# Patient Record
Sex: Female | Born: 1975 | Race: White | Hispanic: Yes | Marital: Married | Smoking: Never smoker
Health system: Southern US, Community
[De-identification: ages and names within clinical notes are randomized; demographics above are authoritative.]

## PROBLEM LIST (undated history)

## (undated) DIAGNOSIS — Z975 Presence of (intrauterine) contraceptive device: Secondary | ICD-10-CM

## (undated) DIAGNOSIS — T8859XA Other complications of anesthesia, initial encounter: Secondary | ICD-10-CM

## (undated) DIAGNOSIS — N3010 Interstitial cystitis (chronic) without hematuria: Secondary | ICD-10-CM

## (undated) DIAGNOSIS — E785 Hyperlipidemia, unspecified: Secondary | ICD-10-CM

## (undated) DIAGNOSIS — K529 Noninfective gastroenteritis and colitis, unspecified: Secondary | ICD-10-CM

## (undated) DIAGNOSIS — K760 Fatty (change of) liver, not elsewhere classified: Secondary | ICD-10-CM

## (undated) DIAGNOSIS — R519 Headache, unspecified: Secondary | ICD-10-CM

## (undated) DIAGNOSIS — J45909 Unspecified asthma, uncomplicated: Secondary | ICD-10-CM

## (undated) DIAGNOSIS — N809 Endometriosis, unspecified: Secondary | ICD-10-CM

## (undated) DIAGNOSIS — K589 Irritable bowel syndrome without diarrhea: Secondary | ICD-10-CM

## (undated) DIAGNOSIS — T4145XA Adverse effect of unspecified anesthetic, initial encounter: Secondary | ICD-10-CM

## (undated) DIAGNOSIS — R51 Headache: Secondary | ICD-10-CM

## (undated) DIAGNOSIS — L408 Other psoriasis: Secondary | ICD-10-CM

## (undated) HISTORY — DX: Fatty (change of) liver, not elsewhere classified: K76.0

## (undated) HISTORY — DX: Other psoriasis: L40.8

## (undated) HISTORY — DX: Unspecified asthma, uncomplicated: J45.909

## (undated) HISTORY — DX: Headache: R51

## (undated) HISTORY — DX: Endometriosis, unspecified: N80.9

## (undated) HISTORY — DX: Presence of (intrauterine) contraceptive device: Z97.5

## (undated) HISTORY — DX: Headache, unspecified: R51.9

## (undated) HISTORY — DX: Irritable bowel syndrome, unspecified: K58.9

## (undated) HISTORY — DX: Noninfective gastroenteritis and colitis, unspecified: K52.9

## (undated) HISTORY — DX: Interstitial cystitis (chronic) without hematuria: N30.10

## (undated) HISTORY — PX: TONSILLECTOMY: SUR1361

## (undated) HISTORY — DX: Hyperlipidemia, unspecified: E78.5

---

## 2008-09-20 HISTORY — PX: TUBAL LIGATION: SHX77

## 2010-07-08 ENCOUNTER — Encounter
Admission: RE | Admit: 2010-07-08 | Discharge: 2010-09-03 | Payer: Self-pay | Source: Home / Self Care | Attending: Family Medicine | Admitting: Family Medicine

## 2010-10-16 ENCOUNTER — Ambulatory Visit
Admission: RE | Admit: 2010-10-16 | Discharge: 2010-10-16 | Payer: Self-pay | Source: Home / Self Care | Attending: Surgery | Admitting: Surgery

## 2010-10-16 LAB — POCT HEMOGLOBIN-HEMACUE: Hemoglobin: 14 g/dL (ref 12.0–15.0)

## 2010-10-21 HISTORY — PX: UMBILICAL HERNIA REPAIR: SHX196

## 2010-10-22 NOTE — Op Note (Signed)
  Sierra Hodge, Sierra Hodge              ACCOUNT NO.:  [id number]  MEDICAL RECORD NO.:  [id number]          PATIENT TYPE:  AMB  LOCATION:  DSC                          FACILITY:  [facility]  PHYSICIAN:  Abigail Miyamoto, M.D. DATE OF BIRTH:  Aug 25, 1976  DATE OF PROCEDURE:  10/16/2010 DATE OF DISCHARGE:                              OPERATIVE REPORT   PREOPERATIVE DIAGNOSIS:  Umbilical hernia.  POSTOPERATIVE DIAGNOSIS:  Umbilical hernia.  PROCEDURE:  Umbilical hernia repair.  SURGEON:  Abigail Miyamoto, MD  ANESTHESIA:  General and 0.5% Marcaine.  ESTIMATED BLOOD LOSS:  Minimal.  PROCEDURE IN DETAIL:  The patient was brought to the operating room identified as [name].  She was placed supine on the operating room table and general anesthesia was induced.  Her abdomen was then prepped and draped in usual sterile fashion.  I anesthetized the skin with the 0.5% Marcaine.  I then made a small transverse incision above the upper edge of the umbilicus.  This was carried down to a small hernia sac, which was easily identified and separated from the overlying umbilical skin with the electrocautery.  I carried the hernia sac down to the fascia and completely excised the sac.  The fascial defect was less than 0.5 cm in size.  I closed the small fascial defect with two figure-of-eight #1 Novafil sutures.  I then anesthetized the fascia circumferentially as well as the skin with the Marcaine.  I replaced the umbilical skin back in place with interrupted 3-0 Vicryl sutures.  I then closed the subcutaneous tissue with interrupted 3-0 Vicryl sutures and closed the skin with a running 4-0 Monocryl.  Steri-Strips, gauze and Tegaderm were then applied.  The patient tolerated the procedure well.  All counts were correct at the end of the procedure.  The patient was then extubated in the operating room and taken in a stable condition to the recovery room.     Abigail Miyamoto,  M.D.     DB/MEDQ  D:  10/16/2010  T:  10/16/2010  Job:  [id number]  Electronically Signed by Abigail Miyamoto M.D. on 10/22/2010 10:08:33 AM

## 2011-03-23 ENCOUNTER — Other Ambulatory Visit (HOSPITAL_COMMUNITY)
Admission: RE | Admit: 2011-03-23 | Discharge: 2011-03-23 | Disposition: A | Discharge status: Home / Self Care | Payer: Managed Care, Other (non HMO) | Source: Ambulatory Visit | Attending: Obstetrics and Gynecology | Admitting: Obstetrics and Gynecology

## 2011-03-23 DIAGNOSIS — Z113 Encounter for screening for infections with a predominantly sexual mode of transmission: Secondary | ICD-10-CM | POA: Insufficient documentation

## 2011-03-23 DIAGNOSIS — Z01419 Encounter for gynecological examination (general) (routine) without abnormal findings: Secondary | ICD-10-CM | POA: Insufficient documentation

## 2011-10-12 ENCOUNTER — Encounter (INDEPENDENT_AMBULATORY_CARE_PROVIDER_SITE_OTHER): Payer: Self-pay | Admitting: Surgery

## 2011-10-18 ENCOUNTER — Encounter (INDEPENDENT_AMBULATORY_CARE_PROVIDER_SITE_OTHER): Payer: Self-pay | Admitting: Surgery

## 2011-10-19 ENCOUNTER — Encounter (INDEPENDENT_AMBULATORY_CARE_PROVIDER_SITE_OTHER): Payer: Self-pay | Admitting: Surgery

## 2011-10-19 ENCOUNTER — Ambulatory Visit (INDEPENDENT_AMBULATORY_CARE_PROVIDER_SITE_OTHER): Payer: Managed Care, Other (non HMO) | Admitting: Surgery

## 2011-10-19 VITALS — BP 98/70 | HR 72 | Temp 98.0°F | Resp 20 | Ht 59.0 in | Wt 129.0 lb

## 2011-10-19 DIAGNOSIS — R1033 Periumbilical pain: Secondary | ICD-10-CM

## 2011-10-19 NOTE — Progress Notes (Signed)
Subjective:     Patient ID: Sierra Hodge, female   DOB: Mar 17, 1976, 36 y.o.   MRN: [id number]  HPI This is a female that I performed umbilical hernia repair without mesh a year ago. She most recently has been having problems with what sounds like cystitis and pain in her upper umbilicus. She was sent here by her urologist for further evaluation. She has not noticed any bulge at the umbilicus and has had no obstructive symptoms.  Review of Systems     Objective:   Physical Exam On exam, her abdomen is soft and nontender. There is no evidence of hernia at the umbilicus.  CAT scan of the abdomen and pelvis which also shows no evidence of recurrent umbilical hernia    Assessment:     Patient with abdominal pain of uncertain etiology with no evidence of recurrent hernia    Plan:     i reassured her regarding hernia. I will see her back here as needed

## 2011-10-21 ENCOUNTER — Encounter (INDEPENDENT_AMBULATORY_CARE_PROVIDER_SITE_OTHER): Payer: Self-pay

## 2011-12-13 ENCOUNTER — Encounter (INDEPENDENT_AMBULATORY_CARE_PROVIDER_SITE_OTHER): Payer: Self-pay

## 2012-01-20 ENCOUNTER — Encounter: Payer: Self-pay | Admitting: Obstetrics and Gynecology

## 2012-01-20 ENCOUNTER — Ambulatory Visit (INDEPENDENT_AMBULATORY_CARE_PROVIDER_SITE_OTHER): Payer: Private Health Insurance - Indemnity | Admitting: Obstetrics and Gynecology

## 2012-01-20 VITALS — BP 98/60 | HR 68 | Temp 98.8°F | Resp 18 | Ht 58.25 in | Wt 125.0 lb

## 2012-01-20 DIAGNOSIS — N898 Other specified noninflammatory disorders of vagina: Secondary | ICD-10-CM

## 2012-01-20 DIAGNOSIS — R350 Frequency of micturition: Secondary | ICD-10-CM

## 2012-01-20 DIAGNOSIS — M549 Dorsalgia, unspecified: Secondary | ICD-10-CM

## 2012-01-20 DIAGNOSIS — IMO0001 Reserved for inherently not codable concepts without codable children: Secondary | ICD-10-CM | POA: Insufficient documentation

## 2012-01-20 LAB — POCT URINALYSIS DIPSTICK
Blood, UA: 50
Glucose, UA: NEGATIVE
Nitrite, UA: NEGATIVE
Protein, UA: NEGATIVE
Spec Grav, UA: 1.01
Urobilinogen, UA: NEGATIVE

## 2012-01-20 NOTE — Patient Instructions (Signed)
Pt has been given ACOG for endometriosis and laparoscopy

## 2012-01-20 NOTE — Progress Notes (Signed)
Vag. Discharge:yes Odor:yes Fever:yes Irreg.Periods:no Dyspareunia:no Dysuria:no Frequency:yes Urgency:no Hematuria:yes Kidney stones:no Constipation:no Diarrhea:no Rectal Bleeding: no Vomiting:no Nausea:no Pregnant:no Fibroids:no Endometriosis:no Hx of Ovarian Cyst:no Hx IUD:no Hx STD-PID:yes, PID after having 2nd child Appendectomy:no Gall Bladder Dz:no Pt c/o abdominal, pelvic, vaginal and lower back pain. Pt states the pain is worse right before her period starts. Pt is also experiencing pain after urinating and frequency. Sierra Hodge  Subjective:     Sierra Hodge is a 36 y.o. female G2P2. who presents with complaints of abdominal/pelvic pain.   Onset of symptoms was gradual starting 5 months ago and is gradually worsening.The pain is located in the lower abdomen, back and in the low pelvis and is described as cramping, pressure-like and stabbing with an intensity of 7 on a 10 point pain scale. The pain does radiate to left back and right back. The pain occurs prior to onset of menses Symptoms don't improve with any interventionsand worsen with urination.  In the past, she has  undergone treatment with  Antibiotics for urinary symptoms.  She has had a complete urologic workup with Dr. McDiarmid including a CT scan, all of which is negative by patient's history.   Associated symptoms: frequency and hematuria Family history: non contributory .  Pertinent gyn history:   Menses: regular every month without intermenstrual spotting, usually lasting less than 6 days and with severe dysmenorrhea  PMH: Past Medical History  Diagnosis Date  . Chronic interstitial cystitis   . Hyperlipidemia   . Other psoriasis   . History of umbilical hernia repair      Medication: (Not in a hospital admission) Allergies: Allergies  Allergen Reactions  . Morphine And Related Itching      Review of systems:   Genitourinary:positive for dysuria, frequency, hematuria and  urgency  Objective:    BP 98/60  Pulse 68  Temp 98.8 F (37.1 C)  Resp 18  Ht 4' 10.25" (1.48 m)  Wt 125 lb (56.7 kg)  BMI 25.90 kg/m2  LMP 01/09/2012  Weight: Wt Readings from Last 1 Encounters:  01/20/12 125 lb (56.7 kg)    BMI: Body mass index is 25.90 kg/(m^2).  General Appearance: Alert, appropriate appearance for age. No acute distress Back: No CVA tendernessr Gastrointestinal: Soft, non-tender, no masses or organomegaly Pelvic Exam: Vulva and vagina appear normal. Bimanual exam reveals normal uterus and adnexa. Rectovaginal: no rectovaginal masses     OSOM BV: neg  Assessment and Plan:   Abdominopelvic pain. History consistent with interstistial cystitis vs endometriosis  Plan:  All questions answered. Diagnosis explained in detail, including differential. Educational material distributed. Schedule for surgery - Diagnostic laparoscopy.  The indications, risks, and benefits reviewed. The patient accepts.. Follow-up: TBD  Sierra Hodge P  MD 5/4/20131:12 AM

## 2012-01-22 ENCOUNTER — Encounter: Payer: Self-pay | Admitting: Obstetrics and Gynecology

## 2012-01-28 LAB — POCT OSOM BVBLUE TEST: Bacterial Vaginosis: NEGATIVE

## 2012-01-31 ENCOUNTER — Other Ambulatory Visit: Payer: Self-pay | Admitting: Urology

## 2012-03-01 ENCOUNTER — Telehealth: Payer: Self-pay | Admitting: Obstetrics and Gynecology

## 2012-03-02 ENCOUNTER — Other Ambulatory Visit: Payer: Self-pay | Admitting: Obstetrics and Gynecology

## 2012-03-15 ENCOUNTER — Encounter (HOSPITAL_COMMUNITY): Payer: Self-pay | Admitting: Pharmacy Technician

## 2012-03-20 ENCOUNTER — Encounter: Payer: Self-pay | Admitting: Obstetrics and Gynecology

## 2012-03-20 ENCOUNTER — Other Ambulatory Visit: Payer: Self-pay | Admitting: Obstetrics and Gynecology

## 2012-03-20 ENCOUNTER — Ambulatory Visit (INDEPENDENT_AMBULATORY_CARE_PROVIDER_SITE_OTHER): Payer: Private Health Insurance - Indemnity | Admitting: Obstetrics and Gynecology

## 2012-03-20 ENCOUNTER — Ambulatory Visit (INDEPENDENT_AMBULATORY_CARE_PROVIDER_SITE_OTHER): Payer: Private Health Insurance - Indemnity

## 2012-03-20 ENCOUNTER — Encounter (HOSPITAL_COMMUNITY): Payer: Self-pay

## 2012-03-20 ENCOUNTER — Other Ambulatory Visit: Payer: Self-pay

## 2012-03-20 ENCOUNTER — Encounter (HOSPITAL_COMMUNITY)
Admission: RE | Admit: 2012-03-20 | Discharge: 2012-03-20 | Disposition: A | Discharge status: Home / Self Care | Payer: Managed Care, Other (non HMO) | Source: Ambulatory Visit | Attending: Obstetrics and Gynecology | Admitting: Obstetrics and Gynecology

## 2012-03-20 VITALS — BP 102/60 | HR 72 | Temp 98.1°F | Resp 18 | Ht 59.0 in | Wt 125.0 lb

## 2012-03-20 DIAGNOSIS — Z01818 Encounter for other preprocedural examination: Secondary | ICD-10-CM

## 2012-03-20 DIAGNOSIS — R102 Pelvic and perineal pain: Secondary | ICD-10-CM

## 2012-03-20 DIAGNOSIS — N949 Unspecified condition associated with female genital organs and menstrual cycle: Secondary | ICD-10-CM

## 2012-03-20 DIAGNOSIS — R109 Unspecified abdominal pain: Secondary | ICD-10-CM

## 2012-03-20 DIAGNOSIS — N736 Female pelvic peritoneal adhesions (postinfective): Secondary | ICD-10-CM

## 2012-03-20 LAB — CBC
HCT: 36.8 % (ref 36.0–46.0)
Hemoglobin: 12.3 g/dL (ref 12.0–15.0)
RBC: 4.26 MIL/uL (ref 3.87–5.11)

## 2012-03-20 LAB — POCT URINALYSIS DIPSTICK
Bilirubin, UA: NEGATIVE
Blood, UA: NEGATIVE
Ketones, UA: NEGATIVE
Leukocytes, UA: NEGATIVE
Protein, UA: NEGATIVE
pH, UA: 8

## 2012-03-20 LAB — SURGICAL PCR SCREEN
MRSA, PCR: NEGATIVE
Staphylococcus aureus: NEGATIVE

## 2012-03-20 NOTE — Patient Instructions (Addendum)
20 Raymonda Mormile  03/20/2012   Your procedure is scheduled on:  03/29/12  Enter through the Main Entrance of [facility] at 7 AM.  Pick up the phone at the desk and dial [phone].   Call this number if you have problems the morning of surgery: [phone]   Remember:   Do not eat food:After Midnight.  Do not drink clear liquids: After Midnight.  Take these medicines the morning of surgery with A SIP OF WATER: NA   Do not wear jewelry, make-up or nail polish.  Do not wear lotions, powders, or perfumes. You may wear deodorant.  Do not shave 48 hours prior to surgery.  Do not bring valuables to the hospital.  Contacts, dentures or bridgework may not be worn into surgery.  Leave suitcase in the car. After surgery it may be brought to your room.  For patients admitted to the hospital, checkout time is 11:00 AM the day of discharge.   Patients discharged the day of surgery will not be allowed to drive home.  Name and phone number of your driver: NA  Special Instructions: CHG Shower Use Special Wash: 1/2 bottle night before surgery and 1/2 bottle morning of surgery.   Please read over the following fact sheets that you were given: MRSA Information

## 2012-03-20 NOTE — Progress Notes (Signed)
Sierra Hodge is an 36 y.o. female. P2 002 who will undergo diagnostic and possible operative laparoscopy for evaluation of pelvic pain. . Onset of symptoms was gradual starting 5 months ago and is gradually worsening.The pain is located in the lower abdomen, back and in the low pelvis and is described  as cramping, pressure-like and stabbing with an intensity of 7 on a 10 point pain scale. The pain does radiate to left back and right back. The pain occurs prior to onset of menses Symptoms don't improve with any interventionsand worsen with urination.  In the past, she has undergone treatment with Antibiotics for urinary symptoms. She has had a complete urologic workup with Dr. McDiarmid including a CT scan, all of which is negative by patient's history. She has recently undergone a cystoscopy with Dr. Jacquelyne Balint and he plans a hydrodistention at the time of her upcoming laparoscopy.   Pertinent Gynecological History: Menses: flow is moderate and with severe dysmenorrhea Bleeding: no abnormal uterine bleed Contraception: tubal ligation DES exposure: unknown Blood transfusions: none Sexually transmitted diseases: no past history and patient states that she had PID after birth of her second child however the description of treatment seems to be most consistent with atrophic vaginitis Previous GYN Procedures: cesarean sections for delivery and tubal ligation  Last mammogram: Not applicable Date: not applicable  Last pap: normal Date: 2011 OB History: G2, P2  Menstrual History: Menarche age: 33 Patient's last menstrual period was 03/06/2012.    Past Medical History  Diagnosis Date  . Chronic interstitial cystitis   . Hyperlipidemia   . Other psoriasis   . History of umbilical hernia repair     Past Surgical History  Procedure Date  . Cesarean section     2008, 2010  . Hernia repair     10/2010  . Tubal ligation     2010  . Tonsillectomy   . Tonsillectomy     Family History    Problem Relation Age of Onset  . Heart disease Paternal Aunt   . Heart disease Paternal Uncle     Social History:  reports that she has never smoked. She has never used smokeless tobacco. She reports that she drinks about .6 ounces of alcohol per week. She reports that she does not use illicit drugs.  Allergies:  Allergies  Allergen Reactions  . Morphine And Related Itching     Review of Systems  Constitutional: Negative.   HENT: Negative.   Eyes: Negative.   Cardiovascular: Negative.   Gastrointestinal: Positive for abdominal pain. Negative for diarrhea, constipation, blood in stool and melena.  Genitourinary: Positive for dysuria, urgency and frequency. Negative for hematuria and flank pain.  Musculoskeletal: Positive for joint pain.       Has seen Dr. Nickola Major with a positive ANA test   Skin: Negative.   Neurological: Negative.   Endo/Heme/Allergies: Negative.   Psychiatric/Behavioral: Negative for depression.       Feels  down because of inability to find the actual cause of and to manage pelvic pain    Blood pressure 102/60, pulse 72, temperature 98.1 F (36.7 C), temperature source Oral, resp. rate 18, height 4\' 11"  (1.499 m), weight 125 lb (56.7 kg), last menstrual period 03/06/2012. Physical Exam  Constitutional: She is oriented to person, place, and time. She appears well-developed and well-nourished.  HENT:  Head: Normocephalic and atraumatic.  Eyes: Conjunctivae and EOM are normal.  Neck: Normal range of motion. Neck supple.  Cardiovascular: Normal rate and regular rhythm.  Respiratory: Effort normal and breath sounds normal.  GI: Soft. Bowel sounds are normal. She exhibits no distension and no mass. There is no rebound and no guarding.  Genitourinary:       Pelvic:  VULVA: normal appearing vulva with no masses, tenderness or lesions,  VAGINA: normal appearing vagina with normal color and discharge, no lesions,there is some tenderness to palpation of the  bladder  CERVIX: normal appearing cervix without discharge or lesions, cervical motion tenderness present,  UTERUS: uterus is normal size, shape, consistency and minimal tenderness,  ADNEXA: tenderness both sides, equally mildly tender, RECTAL: normal rectal, no masses.  Musculoskeletal: Normal range of motion.  Neurological: She is alert and oriented to person, place, and time. She has normal reflexes.  Skin: Skin is warm and dry.  Psychiatric:       Mood appears slightly depressed. Patient denies suicidal or homicidal ideation.    Impression: Chronic pelvic pain Chronic urinary symptoms with dysuria urgency and overactive bladder unresponsive to VESIcare or Toviaz  Plan: The patient will undergo hydrodistention with Dr. McDiarmid She will then undergo diagnostic and operative laparoscopy. The risks of anesthesia bleeding infection and damage to adjacent organs have all been reviewed. The risk that no cause for her pelvic pain will be found has also been reviewed. And the patient wishes to proceed at [facility] on 7/10/ 2013.   Jahsir Rama P 03/22/2012, 4:27 PM

## 2012-03-22 NOTE — H&P (Signed)
Sierra Hodge is an 36 y.o. Sierra Hodge [place] . female. P2 002 who will undergo diagnostic and possible operative laparoscopy for evaluation of pelvic pain. . Onset of symptoms was gradual starting 5 months ago and is gradually worsening.The pain is located in the lower abdomen, back and in the low pelvis and is described  as cramping, pressure-like and stabbing with an intensity of 7 on a 10 point pain scale. The pain does radiate to left back and right back. The pain occurs prior to onset of menses Symptoms don't improve with any interventionsand worsen with urination.  In the past, she has undergone treatment with Antibiotics for urinary symptoms. She has had a complete urologic workup with Dr. McDiarmid including a CT scan, all of which is negative by patient's history. She has recently undergone a cystoscopy with Dr. Jacquelyne Balint and he plans a hydrodistention at the time of her upcoming laparoscopy. She also had a hernia repair in hopes that the pain would improve.  It has not.   Pertinent Gynecological History: Menses: flow is moderate and with severe dysmenorrhea Bleeding: no abnormal uterine bleed Contraception: tubal ligation DES exposure: unknown Blood transfusions: none Sexually transmitted diseases: no past history and patient states that she had PID after birth of her second child however the description of treatment seems to be most consistent with atrophic vaginitis Previous GYN Procedures: cesarean sections for delivery and tubal ligation  Last mammogram: Not applicable Date: not applicable  Last pap: normal Date: 2011 OB History: G2, P2  Menstrual History: Menarche age: 46 Patient's last menstrual period was 03/06/2012.    Past Medical History  Diagnosis Date  . Chronic interstitial cystitis   . Hyperlipidemia   . Other psoriasis   . History of umbilical hernia repair     Past Surgical History  Procedure Date  . Cesarean section     2008, 2010  . Hernia repair    10/2010  . Tubal ligation     2010  . Tonsillectomy   . Tonsillectomy     Family History  Problem Relation Age of Onset  . Heart disease Paternal Aunt   . Heart disease Paternal Uncle     Social History:  reports that she has never smoked. She has never used smokeless tobacco. She reports that she drinks about .6 ounces of alcohol per week. She reports that she does not use illicit drugs.  Allergies:  Allergies  Allergen Reactions  . Morphine And Related Itching     Review of Systems  Constitutional: Negative.   HENT: Negative.   Eyes: Negative.   Cardiovascular: Negative.   Gastrointestinal: Positive for abdominal pain. Negative for diarrhea, constipation, blood in stool and melena.  Genitourinary: Positive for dysuria, urgency and frequency. Negative for hematuria and flank pain.  Musculoskeletal: Positive for joint pain.       Has seen Dr. Nickola Major with a positive ANA test.  She was diagnosed with psoriasis. But uses no medication for this.   Skin: Negative.   Neurological: Negative.   Endo/Heme/Allergies: Negative.   Psychiatric/Behavioral: Negative for depression.       Feels  down because of inability to find the actual cause of and to manage pelvic pain    Blood pressure 102/60, pulse 72, temperature 98.1 F (36.7 C), temperature source Oral, resp. rate 18, height 4\' 11"  (1.499 m), weight 125 lb (56.7 kg), last menstrual period 03/06/2012. Physical Exam  Constitutional: She is oriented to person, place, and time. She appears well-developed and well-nourished.  HENT:  Head: Normocephalic and atraumatic.  Eyes: Conjunctivae and EOM are normal.  Neck: Normal range of motion. Neck supple.  Cardiovascular: Normal rate and regular rhythm.   Respiratory: Effort normal and breath sounds normal.  GI: Soft. Bowel sounds are normal. She exhibits no distension and no mass. There is no rebound and no guarding.  Genitourinary:       Pelvic:  VULVA: normal appearing vulva  with no masses, tenderness or lesions,  VAGINA: normal appearing vagina with normal color and discharge, no lesions,there is some tenderness to palpation of the bladder  CERVIX: normal appearing cervix without discharge or lesions, cervical motion tenderness present,  UTERUS: uterus is normal size, shape, consistency and minimal tenderness,  ADNEXA: tenderness both sides, equally mildly tender, RECTAL: normal rectal, no masses.  Musculoskeletal: Normal range of motion.  Neurological: She is alert and oriented to person, place, and time. She has normal reflexes.  Skin: Skin is warm and dry.  Psychiatric:       Mood appears slightly depressed. Patient denies suicidal or homicidal ideation.   Marland Kitchenl   Impression: Chronic pelvic pain Chronic urinary symptoms with dysuria urgency and overactive bladder unresponsive to VESIcare or Toviaz  Plan: The patient will undergo hydrodistention with Dr. McDiarmid She will then undergo diagnostic and operative laparoscopy. The risks of anesthesia bleeding infection and damage to adjacent organs have all been reviewed. The risk that no cause for her pelvic pain will be found has also been reviewed. And the patient wishes to proceed at [facility] on 7/10/ 2013.

## 2012-03-28 MED ORDER — CIPROFLOXACIN IN D5W 400 MG/200ML IV SOLN
400.0000 mg | INTRAVENOUS | Status: AC
  Administered 2012-03-29: 400 mg via INTRAVENOUS
  Filled 2012-03-28: qty 200

## 2012-03-29 ENCOUNTER — Encounter (HOSPITAL_COMMUNITY): Payer: Self-pay | Admitting: *Deleted

## 2012-03-29 ENCOUNTER — Encounter (HOSPITAL_COMMUNITY): Payer: Self-pay | Admitting: Anesthesiology

## 2012-03-29 ENCOUNTER — Ambulatory Visit (HOSPITAL_COMMUNITY): Payer: Managed Care, Other (non HMO) | Admitting: Anesthesiology

## 2012-03-29 ENCOUNTER — Encounter (HOSPITAL_COMMUNITY)
Admission: RE | Disposition: A | Discharge status: Home / Self Care | Payer: Self-pay | Source: Ambulatory Visit | Attending: Obstetrics and Gynecology

## 2012-03-29 ENCOUNTER — Ambulatory Visit (HOSPITAL_COMMUNITY)
Admission: RE | Admit: 2012-03-29 | Discharge: 2012-03-29 | Disposition: A | Discharge status: Home / Self Care | Payer: Managed Care, Other (non HMO) | Source: Ambulatory Visit | Attending: Obstetrics and Gynecology | Admitting: Obstetrics and Gynecology

## 2012-03-29 DIAGNOSIS — N949 Unspecified condition associated with female genital organs and menstrual cycle: Secondary | ICD-10-CM | POA: Insufficient documentation

## 2012-03-29 DIAGNOSIS — Z01818 Encounter for other preprocedural examination: Secondary | ICD-10-CM | POA: Insufficient documentation

## 2012-03-29 DIAGNOSIS — N318 Other neuromuscular dysfunction of bladder: Secondary | ICD-10-CM | POA: Insufficient documentation

## 2012-03-29 DIAGNOSIS — R109 Unspecified abdominal pain: Secondary | ICD-10-CM

## 2012-03-29 DIAGNOSIS — N736 Female pelvic peritoneal adhesions (postinfective): Secondary | ICD-10-CM

## 2012-03-29 DIAGNOSIS — Z01812 Encounter for preprocedural laboratory examination: Secondary | ICD-10-CM | POA: Insufficient documentation

## 2012-03-29 DIAGNOSIS — R3915 Urgency of urination: Secondary | ICD-10-CM | POA: Insufficient documentation

## 2012-03-29 DIAGNOSIS — N3010 Interstitial cystitis (chronic) without hematuria: Secondary | ICD-10-CM | POA: Insufficient documentation

## 2012-03-29 HISTORY — PX: CYSTO WITH HYDRODISTENSION: SHX5453

## 2012-03-29 HISTORY — PX: LAPAROSCOPY: SHX197

## 2012-03-29 LAB — PREGNANCY, URINE: Preg Test, Ur: NEGATIVE

## 2012-03-29 SURGERY — LAPAROSCOPY OPERATIVE
Anesthesia: General | Wound class: Clean Contaminated

## 2012-03-29 MED ORDER — ROCURONIUM BROMIDE 50 MG/5ML IV SOLN
INTRAVENOUS | Status: AC
  Filled 2012-03-29: qty 1

## 2012-03-29 MED ORDER — DEXAMETHASONE SODIUM PHOSPHATE 10 MG/ML IJ SOLN
INTRAMUSCULAR | Status: DC | PRN
  Administered 2012-03-29: 10 mg via INTRAVENOUS

## 2012-03-29 MED ORDER — MEPERIDINE HCL 25 MG/ML IJ SOLN: 6.2500 mg | INTRAMUSCULAR | Status: DC | PRN

## 2012-03-29 MED ORDER — KETOROLAC TROMETHAMINE 30 MG/ML IJ SOLN
INTRAMUSCULAR | Status: DC | PRN
  Administered 2012-03-29: 30 mg via INTRAVENOUS

## 2012-03-29 MED ORDER — BUPIVACAINE HCL (PF) 0.5 % IJ SOLN
INTRAMUSCULAR | Status: AC
  Filled 2012-03-29: qty 30

## 2012-03-29 MED ORDER — NEOSTIGMINE METHYLSULFATE 1 MG/ML IJ SOLN
INTRAMUSCULAR | Status: DC | PRN
  Administered 2012-03-29: 4 mg via INTRAVENOUS

## 2012-03-29 MED ORDER — PROPOFOL 10 MG/ML IV EMUL
INTRAVENOUS | Status: DC | PRN
  Administered 2012-03-29: 160 mg via INTRAVENOUS

## 2012-03-29 MED ORDER — ONDANSETRON HCL 4 MG/2ML IJ SOLN
INTRAMUSCULAR | Status: DC | PRN
  Administered 2012-03-29: 4 mg via INTRAVENOUS

## 2012-03-29 MED ORDER — PROPOFOL 10 MG/ML IV EMUL
INTRAVENOUS | Status: AC
  Filled 2012-03-29: qty 20

## 2012-03-29 MED ORDER — ACETAMINOPHEN 325 MG PO TABS: 325.0000 mg | ORAL_TABLET | ORAL | Status: DC | PRN

## 2012-03-29 MED ORDER — MIDAZOLAM HCL 2 MG/2ML IJ SOLN
INTRAMUSCULAR | Status: AC
  Filled 2012-03-29: qty 2

## 2012-03-29 MED ORDER — LIDOCAINE HCL (CARDIAC) 20 MG/ML IV SOLN
INTRAVENOUS | Status: AC
  Filled 2012-03-29: qty 5

## 2012-03-29 MED ORDER — FENTANYL CITRATE 0.05 MG/ML IJ SOLN
INTRAMUSCULAR | Status: DC | PRN
  Administered 2012-03-29 (×2): 50 ug via INTRAVENOUS
  Administered 2012-03-29: 100 ug via INTRAVENOUS
  Administered 2012-03-29: 50 ug via INTRAVENOUS

## 2012-03-29 MED ORDER — ONDANSETRON HCL 4 MG/2ML IJ SOLN
INTRAMUSCULAR | Status: AC
  Filled 2012-03-29: qty 2

## 2012-03-29 MED ORDER — FENTANYL CITRATE 0.05 MG/ML IJ SOLN: 25.0000 ug | INTRAMUSCULAR | Status: DC | PRN

## 2012-03-29 MED ORDER — BUPIVACAINE HCL (PF) 0.25 % IJ SOLN
INTRAMUSCULAR | Status: AC
  Filled 2012-03-29: qty 30

## 2012-03-29 MED ORDER — SILVER NITRATE-POT NITRATE 75-25 % EX MISC
CUTANEOUS | Status: AC
  Filled 2012-03-29: qty 3

## 2012-03-29 MED ORDER — PHENAZOPYRIDINE HCL 200 MG PO TABS
ORAL | Status: DC | PRN
  Administered 2012-03-29: 11:00:00 via INTRAVESICAL

## 2012-03-29 MED ORDER — GLYCOPYRROLATE 0.2 MG/ML IJ SOLN
INTRAMUSCULAR | Status: AC
  Filled 2012-03-29: qty 2

## 2012-03-29 MED ORDER — DEXAMETHASONE SODIUM PHOSPHATE 10 MG/ML IJ SOLN
INTRAMUSCULAR | Status: AC
  Filled 2012-03-29: qty 1

## 2012-03-29 MED ORDER — PROMETHAZINE HCL 12.5 MG PO TABS: 12.5000 mg | ORAL_TABLET | Freq: Four times a day (QID) | ORAL | Status: DC | PRN

## 2012-03-29 MED ORDER — KETOROLAC TROMETHAMINE 60 MG/2ML IM SOLN
INTRAMUSCULAR | Status: AC
  Filled 2012-03-29: qty 2

## 2012-03-29 MED ORDER — FENTANYL CITRATE 0.05 MG/ML IJ SOLN
INTRAMUSCULAR | Status: AC
  Filled 2012-03-29: qty 5

## 2012-03-29 MED ORDER — HYDROCODONE-ACETAMINOPHEN 5-500 MG PO TABS: ORAL_TABLET | ORAL | Status: DC

## 2012-03-29 MED ORDER — KETOROLAC TROMETHAMINE 30 MG/ML IJ SOLN: 15.0000 mg | Freq: Once | INTRAMUSCULAR | Status: DC | PRN

## 2012-03-29 MED ORDER — MIDAZOLAM HCL 5 MG/5ML IJ SOLN
INTRAMUSCULAR | Status: DC | PRN
  Administered 2012-03-29: 1 mg via INTRAVENOUS

## 2012-03-29 MED ORDER — IBUPROFEN 600 MG PO TABS: ORAL_TABLET | ORAL | Status: DC

## 2012-03-29 MED ORDER — ROCURONIUM BROMIDE 100 MG/10ML IV SOLN
INTRAVENOUS | Status: DC | PRN
  Administered 2012-03-29: 40 mg via INTRAVENOUS

## 2012-03-29 MED ORDER — GLYCOPYRROLATE 0.2 MG/ML IJ SOLN
INTRAMUSCULAR | Status: DC | PRN
  Administered 2012-03-29: 0.6 mg via INTRAVENOUS

## 2012-03-29 MED ORDER — PHENAZOPYRIDINE HCL 200 MG PO TABS
Freq: Once | ORAL | Status: DC
  Filled 2012-03-29: qty 15

## 2012-03-29 MED ORDER — PROMETHAZINE HCL 25 MG/ML IJ SOLN
INTRAMUSCULAR | Status: AC
  Administered 2012-03-29: 6.25 mg via INTRAVENOUS
  Filled 2012-03-29: qty 1

## 2012-03-29 MED ORDER — KETOROLAC TROMETHAMINE 60 MG/2ML IM SOLN
INTRAMUSCULAR | Status: DC | PRN
  Administered 2012-03-29: 30 mg via INTRAMUSCULAR

## 2012-03-29 MED ORDER — LIDOCAINE HCL (CARDIAC) 20 MG/ML IV SOLN
INTRAVENOUS | Status: DC | PRN
  Administered 2012-03-29: 40 mg via INTRAVENOUS

## 2012-03-29 MED ORDER — NEOSTIGMINE METHYLSULFATE 1 MG/ML IJ SOLN
INTRAMUSCULAR | Status: AC
  Filled 2012-03-29: qty 10

## 2012-03-29 MED ORDER — LACTATED RINGERS IV SOLN
INTRAVENOUS | Status: DC
  Administered 2012-03-29 (×4): via INTRAVENOUS

## 2012-03-29 MED ORDER — PROMETHAZINE HCL 25 MG/ML IJ SOLN
6.2500 mg | INTRAMUSCULAR | Status: DC | PRN
  Administered 2012-03-29: 6.25 mg via INTRAVENOUS

## 2012-03-29 MED ORDER — BUPIVACAINE HCL (PF) 0.25 % IJ SOLN
INTRAMUSCULAR | Status: DC | PRN
  Administered 2012-03-29: 30 mL

## 2012-03-29 MED ORDER — MIDAZOLAM HCL 2 MG/2ML IJ SOLN: 0.5000 mg | Freq: Once | INTRAMUSCULAR | Status: DC | PRN

## 2012-03-29 SURGICAL SUPPLY — 40 items
CANISTER SUCTION 2500CC (MISCELLANEOUS) ×2 IMPLANT
CATH ROBINSON RED A/P 16FR (CATHETERS) ×2 IMPLANT
CHLORAPREP W/TINT 26ML (MISCELLANEOUS) ×2 IMPLANT
CLOTH BEACON ORANGE TIMEOUT ST (SAFETY) ×2 IMPLANT
CONTAINER PREFILL 10% NBF 60ML (FORM) IMPLANT
CORD ACTIVE DISPOSABLE (ELECTRODE) ×1
CORD ELECTRO ACTIVE DISP (ELECTRODE) ×1 IMPLANT
COVER TABLE BACK 60X90 (DRAPES) ×2 IMPLANT
DERMABOND ADVANCED (GAUZE/BANDAGES/DRESSINGS)
DERMABOND ADVANCED .7 DNX12 (GAUZE/BANDAGES/DRESSINGS) IMPLANT
DRAPE HYSTEROSCOPY (DRAPE) IMPLANT
DRESSING TELFA 8X3 (GAUZE/BANDAGES/DRESSINGS) IMPLANT
GLOVE BIO SURGEON STRL SZ7.5 (GLOVE) ×2 IMPLANT
GLOVE SURG SS PI 6.5 STRL IVOR (GLOVE) ×4 IMPLANT
GOWN PREVENTION PLUS LG XLONG (DISPOSABLE) ×14 IMPLANT
NDL SAFETY ECLIPSE 18X1.5 (NEEDLE) ×1 IMPLANT
NEEDLE HYPO 18GX1.5 SHARP (NEEDLE) ×1
NS IRRIG 1000ML POUR BTL (IV SOLUTION) ×4 IMPLANT
PACK LAPAROSCOPY BASIN (CUSTOM PROCEDURE TRAY) IMPLANT
PACK LAVH (CUSTOM PROCEDURE TRAY) ×2 IMPLANT
PACK VAGINAL WOMENS (CUSTOM PROCEDURE TRAY) IMPLANT
PLUG CATH AND CAP STER (CATHETERS) IMPLANT
POUCH SPECIMEN RETRIEVAL 10MM (ENDOMECHANICALS) IMPLANT
PROTECTOR NERVE ULNAR (MISCELLANEOUS) ×2 IMPLANT
SCALPEL HARMONIC ACE (MISCELLANEOUS) IMPLANT
SET CYSTO W/LG BORE CLAMP LF (SET/KITS/TRAYS/PACK) ×2 IMPLANT
SET IRRIG TUBING LAPAROSCOPIC (IRRIGATION / IRRIGATOR) IMPLANT
STRIP CLOSURE SKIN 1/4X3 (GAUZE/BANDAGES/DRESSINGS) ×2 IMPLANT
SUT VIC AB 3-0 PS2 18 (SUTURE)
SUT VIC AB 3-0 PS2 18XBRD (SUTURE) IMPLANT
SUT VICRYL 0 ENDOLOOP (SUTURE) IMPLANT
SUT VICRYL 0 UR6 27IN ABS (SUTURE) IMPLANT
SYR 20CC LL (SYRINGE) ×2 IMPLANT
TOWEL OR 17X24 6PK STRL BLUE (TOWEL DISPOSABLE) ×8 IMPLANT
TRAY FOLEY CATH 14FR (SET/KITS/TRAYS/PACK) ×2 IMPLANT
TROCAR BALL TOP DISP 5MM (ENDOMECHANICALS) ×2 IMPLANT
TROCAR Z-THREAD BLADED 11X100M (TROCAR) ×2 IMPLANT
TROCAR Z-THREAD BLADED 5X100MM (TROCAR) IMPLANT
WARMER LAPAROSCOPE (MISCELLANEOUS) ×2 IMPLANT
WATER STERILE IRR 1000ML POUR (IV SOLUTION) ×2 IMPLANT

## 2012-03-29 NOTE — H&P (Signed)
History of Present Illness   Sierra Hodge still has her left flank pain. She has some suprapubic discomfort. She was diagnosed in [place] I believe of having interstitial cystitis. It sounds like she has never been on Elmiron or other formal therapies.   She has failed multiple antimuscarinics with side effects, including Toviaz, VESIcare, Enablex and Detrol.  She may have endometriosis. She is considering a laparoscopy.   I talked to her about interstitial cystitis. Her frequency every 30 minutes may be due to this. She also leaks without awareness. They are moderate in severity and persisting.   Review of systems: No change in bowel or neurologic status.    Past Medical History Problems  1. History of  Chronic Interstitial Cystitis 595.1 2. History of  Hypercholesterolemia 272.0 3. History of  Psoriasis 696.1  Surgical History Problems  1. History of  Cesarean Section 2. History of  Cesarean Section 3. History of  Hernia Repair 4. History of  Tonsillectomy 5. History of  Tonsillectomy 6. History of  Tubal Ligation V25.2 7. History of  Umbilical Hernia Repair  Current Meds 1. Clobetasol Propionate 0.05 % External Cream; Therapy: (Recorded:22Oct2012) to 2. Clobetasol Propionate 0.05 % External Ointment; Therapy: 16Nov2012 to 3. Detrol LA 4 MG Oral Capsule Extended Release 24 Hour; TAKE 1 CAPSULE Daily; Therapy:  28Dec2012 to (Last Rx:28Dec2012)  Requested for: 28Dec2012 4. Fluticasone Propionate 50 MCG/ACT Nasal Suspension; Therapy: 12Sep2012 to 5. Toviaz 8 MG Oral Tablet Extended Release 24 Hour; TAKE 1 TABLET DAILY; Therapy: 03Jan2013  to (Evaluate:29Dec2013); Last Rx:03Jan2013 6. VESIcare 5 MG Oral Tablet; a tablet daily for 30 days; Therapy: 03Jan2013 to (Last Rx:03Jan2013)  Family History Problems  1. Paternal history of  Alzheimer's Disease 2. Paternal history of  Death In The Family Father father passes @ age 81alzheimers 3. Family history of  Family Health Status  Number Of Children 2 daughters 4. Maternal history of  Hypercholesterolemia 5. Maternal history of  Nephrolithiasis  Social History Problems  1. Alcohol Use 1 glass of red wine 2. Caffeine Use 1 drink daily 3. Marital History - Currently Married 4. 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 History of  Tobacco Use  Vitals Vital Signs [Data Includes: Last 1 Day]  13May2013 09:52AM  Blood Pressure: 104 / 63 Temperature: 98.2 F Heart Rate: 66  Results/Data  Urine [Data Includes: Last 1 Day]   13May2013  COLOR YELLOW   APPEARANCE CLEAR   SPECIFIC GRAVITY 1.015   pH 6.0   GLUCOSE NEG mg/dL  BILIRUBIN NEG   KETONE NEG mg/dL  BLOOD MOD   PROTEIN NEG mg/dL  UROBILINOGEN 0.2 mg/dL  NITRITE NEG   LEUKOCYTE ESTERASE NEG   SQUAMOUS EPITHELIAL/HPF NONE SEEN   WBC NONE SEEN WBC/hpf  RBC 0-3 RBC/hpf  BACTERIA NONE SEEN   CRYSTALS NONE SEEN   CASTS NONE SEEN   Other MICRO UNSPUN:QNS    Assessment Assessed  1. Abdominal Pain 789.00 2. Chronic Interstitial Cystitis 595.1  Plan   Discussion/Summary   Sierra Hodge has ongoing frequency and may have interstitial cystitis. I talked to her about a bladder hydrodistension.   We talked about cystoscopy/hydrodistension and instillation in detail. Pros, cons, general surgical and anesthetic risks, and other options including watchful waiting were discussed. Risks were described but not limited to pain, infection, and bleeding. The risk of bladder perforation and management were discussed. The patient understands that it is primarily a diagnostic procedure.   Her symptoms are refractory to any muscarinic's.  She did have some worries about co-pay and repetitively wished to be treated over the phone. This has been documented.   Sierra Hodge is hoping to have a hydrodistension the same time as her laparoscopy for possible endometriosis by Sierra Hodge. I am going to send a copy of my note to Sierra Hodge to keep her  updated on her treatment course.  After a thorough review of the management options for the patient's condition the patient  elected to proceed with surgical therapy as noted above. We have discussed the potential benefits and risks of the procedure, side effects of the proposed treatment, the likelihood of the patient achieving the goals of the procedure, and any potential problems that might occur during the procedure or recuperation. Informed consent has been obtained.

## 2012-03-29 NOTE — Anesthesia Preprocedure Evaluation (Signed)
Anesthesia Evaluation  Patient identified by MRN, date of birth, ID band Patient awake    Reviewed: Allergy & Precautions, H&P , Patient's Chart, lab work & pertinent test results, reviewed documented beta blocker date and time   History of Anesthesia Complications Negative for: history of anesthetic complications  Airway Mallampati: II TM Distance: >3 FB Neck ROM: full    Dental No notable dental hx.    Pulmonary neg pulmonary ROS,  breath sounds clear to auscultation  Pulmonary exam normal       Cardiovascular Exercise Tolerance: Good negative cardio ROS  Rhythm:regular Rate:Normal     Neuro/Psych negative neurological ROS  negative psych ROS   GI/Hepatic negative GI ROS, Neg liver ROS,   Endo/Other  negative endocrine ROS  Renal/GU negative Renal ROS     Musculoskeletal   Abdominal   Peds  Hematology negative hematology ROS (+)   Anesthesia Other Findings   Reproductive/Obstetrics negative OB ROS                           Anesthesia Physical Anesthesia Plan  ASA: II  Anesthesia Plan: General ETT   Post-op Pain Management:    Induction:   Airway Management Planned:   Additional Equipment:   Intra-op Plan:   Post-operative Plan:   Informed Consent: I have reviewed the patients History and Physical, chart, labs and discussed the procedure including the risks, benefits and alternatives for the proposed anesthesia with the patient or authorized representative who has indicated his/her understanding and acceptance.   Dental Advisory Given  Plan Discussed with: CRNA and Surgeon  Anesthesia Plan Comments:         Anesthesia Quick Evaluation  

## 2012-03-29 NOTE — Op Note (Signed)
Preoperative diagnosis: Pelvic pain Postop diagnosis: Interstitial cystitis Surgery: Bladder hydrodistention and cystoscopy and bladder installation therapy Surgeon: Dr. Lorin Picket Sierra Hodge  The patient has the above diagnoses. She was given preoperative antibiotics 21 [place] scope was utilized. Bladder mucosa and trigone were normal. She was hydrodistended to 800 mL  The bladder was emptied and reinspected  She diffuse glomerulations throughout the bladder. There was no ulcers. There is no bladder injury. Bladder was emptied  As a separate procedure using a red rubber catheter 15 cc of 0.5% Marcaine +400 mg a pretty was instilled in the bladder.  The patient will undergo laparoscopy.

## 2012-03-29 NOTE — Op Note (Signed)
Diagnostic Laparoscopy Procedure Note  Indications: The patient is a 36 y.o. female with pelvic pain urinary frequency and periumbilical pain  Pre-operative Diagnosis: pelvic pain  Post-operative Diagnosis: extensive pelvic adhesions  Surgeon: Dierdre Forth P   Assistants:ELMIRA Lowell Guitar PA-C  Anesthesia: General endotracheal anesthesia  ASA Class: 2  Procedure Details  The patient was seen in the Holding Room. The risks, benefits, complications, treatment options, and expected outcomes were discussed with the patient. The possibilities of reaction to medication, pulmonary aspiration, perforation of viscus, bleeding, recurrent infection, the need for additional procedures, failure to diagnose a condition, and creating a complication requiring transfusion or operation were discussed with the patient. The patient concurred with the proposed plan, giving informed consent. The patient was taken to the Operating Room, identified as Sierra Hodge, and underwent hysteroscopic hydrodistention with Dr. Sherron Monday.   After completion of that procedure, I entered the operating room and the subsequent procedure verified as Diagnostic, possible operative  Laparoscopy. A Time Out was held and the above information confirmed.  After induction of general anesthesia, the patient was placed in modified dorsal lithotomy position where she was prepped, draped, and catheterized in the normal, sterile fashion.  The cervix was visualized and an intrauterine manipulator was placed. The subumbilical area and suprapubic areas to the right and left of midline were infiltrated with 0.025% Marcaine for a total of 10 cc.  A 2 cm umbilical incision was then performed. Veress needle was passed and pneumoperitoneum was established.  A laparoscopic trocar was then placed through that incision, and the laparoscope placed through the trocar sleeve. A suprapubic incision was made to the left of midline and a laparoscopic probe  trocar placed through that incision under direct visualization.The above findings were noted.  Biopsy of the posterior cul-de-sac clear bleb-like lesions was undertaken. Hemostasis there was noted to be adequate and 10 cc of quarter percent Marcaine was placed in that region.  Systematic and progressive lysis of the very extensive adhesions between the omentum and the anterior abdominal wall were then undertaken with hemostasis provided along the way with monopolar and bipolar cautery. After extensive sharp lysis of adhesions the omentum was freed from the anterior abdominal wall and hemostasis noted to be adequate. Copious irrigation was carried out and 100 cc of lactated Ringer's left in the pelvis.  Following the procedure the umbilical sheath was removed after intra-abdominal carbon dioxide was expressed. The incision was closed with a fascial suture of 0 Vicryl in a figure-of-eight fashion,  subcutaneous  sutures of 3-0 Vicryl.  The skin incisions were closed with Dermabond. The intrauterine manipulator was then removed.  Prior to removal of the Foley catheter a solution of Marcaine and Pyridium as ordered by Dr. McDermitt was instilled and the Foley catheter removed  Instrument, sponge, and needle counts were correct prior to abdominal closure and at the conclusion of the case.   Findings: The anterior cul-de-sac and round ligaments: small filmy adhesions in the anterior cul-de-sac status post cesarean section The uterus :  Normal sized with the fallopian tubes status post interruption for sterilization. The adnexa :  The ovaries bilaterally were within normal limits without adhesions or stigmata of endometriosis. Cul-de-sac the posterior cul-de-sac contained bleb-like lesions at the junction of the uterosacral ligaments with the posterior uterus. Peritoneum:  There were extensive adhesions between the omentum and the anterior abdominal wall which extended over a space of approximately 6 cm.  This was several centimeters below the umbilicus and did not at all involved  the area where her hernia repair had been performed  Estimated Blood Loss:  less than 50 mL         Drains: none         Total IV Fluids:         Specimens: posterior peritoneal biopsy              Complications:  None; patient tolerated the procedure well.         Disposition: PACU - hemodynamically stable.         Condition: stable

## 2012-03-29 NOTE — Anesthesia Postprocedure Evaluation (Signed)
  Anesthesia Post-op Note  Patient: Sierra Hodge  Procedure(s) Performed: Procedure(s) (LRB): LAPAROSCOPY OPERATIVE (N/A) CYSTOSCOPY/HYDRODISTENSION (N/A)  Patient Location: PACU  Anesthesia Type: General  Level of Consciousness: awake, alert  and oriented  Airway and Oxygen Therapy: Patient Spontanous Breathing  Post-op Pain: mild  Post-op Assessment: Post-op Vital signs reviewed, Patient's Cardiovascular Status Stable, Respiratory Function Stable, Patent Airway, No signs of Nausea or vomiting and Pain level controlled  Post-op Vital Signs: Reviewed and stable  Complications: No apparent anesthesia complications

## 2012-03-29 NOTE — Transfer of Care (Signed)
Immediate Anesthesia Transfer of Care Note  Patient: Sierra Hodge  Procedure(s) Performed: Procedure(s) (LRB): LAPAROSCOPY OPERATIVE (N/A) CYSTOSCOPY/HYDRODISTENSION (N/A)  Patient Location: PACU  Anesthesia Type: General  Level of Consciousness: awake and alert   Airway & Oxygen Therapy: Patient Spontanous Breathing and Patient connected to nasal cannula oxygen  Post-op Assessment: Report given to PACU RN and Post -op Vital signs reviewed and stable  Post vital signs: Reviewed and stable  Complications: No apparent anesthesia complications

## 2012-03-31 ENCOUNTER — Encounter (HOSPITAL_COMMUNITY): Payer: Self-pay | Admitting: Obstetrics and Gynecology

## 2012-04-11 ENCOUNTER — Ambulatory Visit (INDEPENDENT_AMBULATORY_CARE_PROVIDER_SITE_OTHER): Payer: Private Health Insurance - Indemnity | Admitting: Obstetrics and Gynecology

## 2012-04-11 ENCOUNTER — Encounter: Payer: Self-pay | Admitting: Obstetrics and Gynecology

## 2012-04-11 VITALS — BP 70/50 | Temp 98.1°F | Wt 121.0 lb

## 2012-04-11 DIAGNOSIS — N809 Endometriosis, unspecified: Secondary | ICD-10-CM

## 2012-04-11 DIAGNOSIS — N3010 Interstitial cystitis (chronic) without hematuria: Secondary | ICD-10-CM | POA: Insufficient documentation

## 2012-04-11 NOTE — Patient Instructions (Signed)
Endometriosis Endometriosis is a disease that occurs when the endometrium (lining of the uterus) is misplaced outside of its normal location. It may occur in many locations close to the uterus (womb), but commonly on the ovaries, fallopian tubes, vagina (birth canal) and bowel located close to the uterus. Because the uterus sloughs (expels) its lining every month (menses), there is bleeding whereever the endometrial tissue is located. SYMPTOMS  Often there are no symptoms. However, because blood is irritating to tissues not normally exposed to it, when symptoms occur they vary with the location of the misplaced endometrium. Symptoms often include back and abdominal pain. Periods may be heavier and intercourse may be painful. Infertility may be present. You may have all of these symptoms at one time or another or you may have months with no symptoms at all. Although the symptoms occur mainly during menses, they can occur mid-cycle as well, and usually terminate with menopause. DIAGNOSIS  Your caregiver may recommend a blood test and urine test (urinalysis) to help rule out other conditions. Another common test is ultrasound, a painless procedure that uses sound waves to make a sonogram "picture" of abnormal tissue that could be endometriosis. If your bowel movements are painful around your periods, your caregiver may advise a barium enema (an X-ray of the lower bowel), to try to find the source of your pain. This is sometimes confirmed by laparoscopy. Laparoscopy is a procedure where your caregiver looks into your abdomen with a laparoscope (a small pencil sized telescope). Your caregiver may take a tiny piece of tissue (biopsy) from any abnormal tissue to confirm or document your problem. These tissues are sent to the lab and a pathologist looks at them under the microscope to give a microscopic diagnosis. TREATMENT  Once the diagnosis is made, it can be treated by destruction of the misplaced endometrial  tissue using heat (diathermy), laser, cutting (excision), or chemical means. It may also be treated with hormonal therapy. When using hormonal therapy menses are eliminated, therefore eliminating the monthly exposure to blood by the misplaced endometrial tissue. Only in severe cases is it necessary to perform a hysterectomy with removal of the tubes, uterus and ovaries. HOME CARE INSTRUCTIONS   Only take over-the-counter or prescription medicines for pain, discomfort, or fever as directed by your caregiver.   Avoid activities that produce pain, including a physical sexual relationship.   Do not take aspirin as this may increase bleeding when not on hormonal therapy.   See your caregiver for pain or problems not controlled with treatment.  SEEK IMMEDIATE MEDICAL CARE IF:   Your pain is severe and is not responding to pain medicine.   You develop severe nausea and vomiting, or you cannot keep foods down.   Your pain localizes to the right lower part of your abdomen (possible appendicitis).   You have swelling or increasing pain in the abdomen.   You have a fever.   You see blood in your stool.  MAKE SURE YOU:   Understand these instructions.   Will watch your condition.   Will get help right away if you are not doing well or get worse.  Document Released: 09/03/2000 Document Revised: 08/26/2011 Document Reviewed: 04/24/2008 Exit[employer] Patient Information 2012 [employer]. 

## 2012-04-11 NOTE — Progress Notes (Signed)
Surgery: Laparoscopy Lysis of Adhesions.  Excision of endometriosis   Date: July 10,2013  Eating a regular diet without difficulty. Low acid foods and no caffeine due to IC. Bowel movements are normal.  Pain is controlled without any medications.  Bladder function is returned to normal. Vaginal bleeding: none Vaginal discharge: no vaginal discharge   Pathology Cul-de-sac biopsy, posterior - ENDOMETRIOSIS. - NO EVIDENCE OF MALIGNANCY.  Subjective:     Sierra Hodge is a 36 y.o. female who presents for post-op visit.  Has had one menses without significant pain.  Has seen urologist and given diagnosis of interstitial cystitis.  Dietary control recommended Pathology report:was reviewed with patient.  The following portions of the patient's history were reviewed and updated as appropriate: allergies, current medications, past family history, past medical history, past social history, past surgical history and problem list.  Review of Systems Pertinent items are noted in HPI.   Objective:    BP 70/50  Temp 98.1 F (36.7 C)  Wt 121 lb (54.885 kg)  LMP 03/06/2012 Weight:  Wt Readings from Last 1 Encounters:  04/11/12 121 lb (54.885 kg)    BMI: There is no height on file to calculate BMI.  General Appearance: Alert, appropriate appearance for age. No acute distress Lungs: clear to auscultation bilaterally Back: No CVA tenderness Cardiovascular: Regular rate and rhythm. S1, S2, no murmur Gastrointestinal: Soft, non-tender, no masses or organomegaly Incision/s: healing well Pelvic Exam: Normal vulval and vagina   Bimanual exam normal  Assessment:    Doing well postoperatively. Operative findings again reviewed. Endometriosis IC  Plan:    1. Continue any current medications and dietary recommendations from urologist 2. Wound care discussed. 3. Activity restrictions: none 4. Anticipated return to work: not applicable. 5. Follow up: 3 months 6.  Will decide at that time  about the need for further meds for endometriosis   Sierra Hodge P MD 7/23/201310:53 AM

## 2012-07-24 ENCOUNTER — Telehealth: Payer: Self-pay | Admitting: Obstetrics and Gynecology

## 2012-07-24 NOTE — Telephone Encounter (Signed)
Spoke with pt rgd msg pt wants test results from July informed pt labs wnl pt voice understanding

## 2012-07-25 ENCOUNTER — Ambulatory Visit: Payer: Private Health Insurance - Indemnity | Admitting: Obstetrics and Gynecology

## 2013-08-31 ENCOUNTER — Telehealth: Payer: Self-pay

## 2013-08-31 NOTE — Telephone Encounter (Signed)
Left message for call back  identifiable     

## 2013-09-03 ENCOUNTER — Encounter: Payer: Self-pay | Admitting: Internal Medicine

## 2013-09-03 ENCOUNTER — Ambulatory Visit (INDEPENDENT_AMBULATORY_CARE_PROVIDER_SITE_OTHER): Payer: BC Managed Care – PPO | Admitting: Internal Medicine

## 2013-09-03 VITALS — BP 100/64 | HR 80 | Temp 98.0°F | Wt 131.0 lb

## 2013-09-03 DIAGNOSIS — B829 Intestinal parasitism, unspecified: Secondary | ICD-10-CM

## 2013-09-03 DIAGNOSIS — R42 Dizziness and giddiness: Secondary | ICD-10-CM

## 2013-09-03 NOTE — Progress Notes (Signed)
Pre visit review using our clinic review tool, if applicable. No additional management support is needed unless otherwise documented below in the visit note. 

## 2013-09-03 NOTE — Progress Notes (Signed)
Subjective:    Patient ID: Sierra Hodge, female    DOB: 1976/06/14, 37 y.o.   MRN: [id number]  HPI New patient, here to discuss dizzines  Last month had an episode of dizziness, felt  Weak; denies feeling spinning, "everything got dark". Denies any associated chest pain, palpitations. No  nausea, vomiting, diarrhea. Denies loss of consciousness or headaches. Shortly after feeling that way , went to a pharmacy and her BP was 90/60. Since then had similar but less intense episode. Today is asymptomatic.  Also wonders about having a parasitic infection. See below   Past Medical History  Diagnosis Date  . Chronic interstitial cystitis   . Hyperlipidemia   . Other psoriasis   . Endometriosis    Past Surgical History  Procedure Laterality Date  . Cesarean section      2008, 2010  . Hernia repair      10/2010  . Tubal ligation      2010  . Tonsillectomy    . Laparoscopy  03/29/2012    Procedure: LAPAROSCOPY OPERATIVE;  Surgeon: Hal Morales, MD;  Location: [facility];  Service: Gynecology;  Laterality: N/A;  with Fulguration of Endometriosis and Peritoneal Biopsy  . Cysto with hydrodistension  03/29/2012    Procedure: CYSTOSCOPY/HYDRODISTENSION;  Surgeon: Martina Sinner, MD;  Location: [facility];  Service: Urology;  Laterality: N/A;  with Instillation of Marcaine and Pyridium    History   Social History  . Marital Status: Married    Spouse Name: N/A    Number of Children: 2  . Years of Education: N/A   Occupational History  . college graduate, stay home mom    Social History Main Topics  . Smoking status: Never Smoker   . Smokeless tobacco: Never Used  . Alcohol Use: No     Comment: rarely   . Drug Use: No  . Sexual Activity: Yes    Partners: Male    Birth Control/ Protection: Surgical     Comment: BTL    Other Topics Concern  . Not on file   Social History Narrative   From [place]    Family History  Problem Relation Age of Onset  . Heart disease  Other     paternal family  . Diabetes Paternal Uncle   . Colon cancer Neg Hx   . Breast cancer Neg Hx       Review of Systems Denies fever or chills. No cough No weight loss or weight gain. No major problems with anxiety or depression. Denies symptoms related to standing up or turning her head    Objective:   Physical Exam BP 100/64  Pulse 80  Temp(Src) 98 F (36.7 C)  Wt 131 lb (59.421 kg)  SpO2 97% General -- alert, well-developed, NAD.  Neck --no thyromegaly  HEENT-- Not pale.  lungs -- normal respiratory effort, no intercostal retractions, no accessory muscle use, and normal breath sounds.  Heart-- normal rate, regular rhythm, no murmur.  Abdomen-- Not distended, good bowel sounds,soft, slt tender @ mid lower abd . No rebound or rigidity.   Extremities-- no pretibial edema bilaterally  Neurologic--  alert & oriented X3. Speech normal, gait normal, strength normal in all extremities.  Psych-- Cognition and judgment appear intact. Cooperative with normal attention span and concentration. No anxious appearing , no depressed appearing.      Assessment & Plan:  parasites? Went to visit [place] few weeks ago, since then is having pruritus at the anal area,  daughter diagnosed w/ parasites. See instructions

## 2013-09-03 NOTE — Patient Instructions (Addendum)
Before you leave, go to the lab labs and get containers for stool studies. Sheena we need O&P-- dx exposure to parasites   Get an appointment for blood work only next week: FLP --- dx screening for hyperlipidemia  Next visit for a   follow up in 6 weeks, fasting. Please make an appointment

## 2013-09-03 NOTE — Telephone Encounter (Signed)
Unable to reach pre visit.  

## 2013-09-03 NOTE — Assessment & Plan Note (Addendum)
Dizziness as described in the history of present illness, exam is benign, BP today is low but she is asymptomatic, doubt symptoms related to low BP. Plan: EKG on return to the office (machine down at the time of her visit) Labs Further advice w/ results. Recommend to return to the office in 6 weeks Also likes screening for high cholesterol, see instructions

## 2013-09-04 LAB — CBC WITH DIFFERENTIAL/PLATELET
Basophils Relative: 0.7 % (ref 0.0–3.0)
Eosinophils Absolute: 0.1 10*3/uL (ref 0.0–0.7)
HCT: 36.9 % (ref 36.0–46.0)
Hemoglobin: 12.4 g/dL (ref 12.0–15.0)
Lymphs Abs: 2.1 10*3/uL (ref 0.7–4.0)
MCHC: 33.5 g/dL (ref 30.0–36.0)
MCV: 86 fl (ref 78.0–100.0)
Monocytes Absolute: 0.5 10*3/uL (ref 0.1–1.0)
Neutro Abs: 6.7 10*3/uL (ref 1.4–7.7)
RBC: 4.29 Mil/uL (ref 3.87–5.11)
RDW: 13.6 % (ref 11.5–14.6)

## 2013-09-04 LAB — COMPREHENSIVE METABOLIC PANEL
ALT: 28 U/L (ref 0–35)
AST: 40 U/L — ABNORMAL HIGH (ref 0–37)
Alkaline Phosphatase: 55 U/L (ref 39–117)
BUN: 14 mg/dL (ref 6–23)
Creatinine, Ser: 0.5 mg/dL (ref 0.4–1.2)

## 2013-09-06 ENCOUNTER — Encounter: Payer: Self-pay | Admitting: *Deleted

## 2013-09-11 ENCOUNTER — Other Ambulatory Visit (INDEPENDENT_AMBULATORY_CARE_PROVIDER_SITE_OTHER): Payer: BC Managed Care – PPO

## 2013-09-11 DIAGNOSIS — E785 Hyperlipidemia, unspecified: Secondary | ICD-10-CM

## 2013-09-11 DIAGNOSIS — B829 Intestinal parasitism, unspecified: Secondary | ICD-10-CM

## 2013-09-11 LAB — LDL CHOLESTEROL, DIRECT: Direct LDL: 155.7 mg/dL

## 2013-09-11 LAB — LIPID PANEL
Total CHOL/HDL Ratio: 4
Triglycerides: 50 mg/dL (ref 0.0–149.0)

## 2013-09-12 ENCOUNTER — Encounter: Payer: Managed Care, Other (non HMO) | Admitting: Internal Medicine

## 2013-09-12 NOTE — Addendum Note (Signed)
Addended by: Silvio Pate D on: 09/12/2013 01:23 PM   Modules accepted: Orders

## 2013-09-14 LAB — OVA AND PARASITE SCREEN

## 2013-09-26 LAB — HM PAP SMEAR: HM Pap smear: NORMAL

## 2013-09-28 LAB — HM PAP SMEAR

## 2013-10-08 ENCOUNTER — Ambulatory Visit: Payer: BC Managed Care – PPO | Admitting: Internal Medicine

## 2013-10-19 ENCOUNTER — Encounter: Payer: Self-pay | Admitting: Internal Medicine

## 2013-10-19 ENCOUNTER — Ambulatory Visit (INDEPENDENT_AMBULATORY_CARE_PROVIDER_SITE_OTHER): Payer: BC Managed Care – PPO | Admitting: Internal Medicine

## 2013-10-19 VITALS — BP 86/56 | HR 84 | Temp 97.9°F | Wt 129.0 lb

## 2013-10-19 DIAGNOSIS — R42 Dizziness and giddiness: Secondary | ICD-10-CM

## 2013-10-19 DIAGNOSIS — E785 Hyperlipidemia, unspecified: Secondary | ICD-10-CM

## 2013-10-19 MED ORDER — IBUPROFEN 600 MG PO TABS: ORAL_TABLET | ORAL | Status: DC

## 2013-10-19 NOTE — Progress Notes (Signed)
Pre visit review using our clinic review tool, if applicable. No additional management support is needed unless otherwise documented below in the visit note. 

## 2013-10-19 NOTE — Patient Instructions (Signed)
Rest, fluids , tylenol For cough, take Mucinex DM twice a day as needed  For congestion use OTC Nasocort: 2 nasal sprays on each side of the nose daily until you feel better Call if no better in few days Call anytime if the symptoms are severe, you have high fever, short of breath, chest pain  If you need more information about a healthy diet, exercise  visit  the American Heart Association, it  is a great resource online at:  [email or web address]/[email or web address]    Next visit is for a physical exam in 4-5 months , fasting Please make an appointment

## 2013-10-19 NOTE — Progress Notes (Signed)
   Subjective:    Patient ID: Sierra Hodge, female    DOB: 1976-02-13, 38 y.o.   MRN: [id number]  HPI Followup from previous visit Dizziness improved, no further episodes when " everything goes dark", no LOC. We also discussed her labs including a slightly increasing LFTs, mild hyperlipidemia and stool studies that showed no parasites but protozoa  that needs no treatment. Concerned about her blood pressure which is low however she feels well today  Past Medical History  Diagnosis Date  . Chronic interstitial cystitis   . Hyperlipidemia   . Other psoriasis   . Endometriosis    Past Surgical History  Procedure Laterality Date  . Cesarean section      2008, 2010  . Hernia repair      10/2010  . Tubal ligation      2010  . Tonsillectomy    . Laparoscopy  03/29/2012    Procedure: LAPAROSCOPY OPERATIVE;  Surgeon: Hal MoralesVanessa P Haygood, MD;  Location: [facility];  Service: Gynecology;  Laterality: N/A;  with Fulguration of Endometriosis and Peritoneal Biopsy  . Cysto with hydrodistension  03/29/2012    Procedure: CYSTOSCOPY/HYDRODISTENSION;  Surgeon: Martina SinnerScott A MacDiarmid, MD;  Location: [facility];  Service: Urology;  Laterality: N/A;  with Instillation of Marcaine and Pyridium     Review of Systems Also has a URI, see nurse's notes. Denies fever, chills, cough. Also denies chest pain, difficulty breathing or palpitations.     Objective:   Physical Exam  BP 86/56  Pulse 84  Temp(Src) 97.9 F (36.6 C)  Wt 129 lb (58.514 kg)  SpO2 95% General -- alert, well-developed, NAD.   HEENT-- Not pale. TMs normal, throat symmetric, no redness or discharge. Face symmetric, sinuses not tender to palpation. Nose moderately  congested.  Lungs -- normal respiratory effort, no intercostal retractions, no accessory muscle use, and normal breath sounds.  Heart-- normal rate, regular rhythm, no murmur.   largement, nodularity, tenderness, mass, asymmetry or induration. Extremities-- no pretibial edema  bilaterally  Neurologic--  alert & oriented X3. Speech normal, gait normal, strength normal in all extremities.   Psych-- Cognition and judgment appear intact. Cooperative with normal attention span and concentration. No anxious or depressed appearing.      Assessment & Plan:  Parasites? See stool studies, no parasites found Dizziness--improved, labs were okay, recommend observation Slight increase in LFTs-  recheck in 4 months Mild Hyperlipidemia--diet and exercise discussed, see instructions, recheck in 4 months URI--see instructions Low  BP--patient is asymptomatic, on chart review she often times has her BP in the low side, reassured

## 2013-12-06 ENCOUNTER — Other Ambulatory Visit: Payer: Self-pay | Admitting: Dermatology

## 2013-12-07 ENCOUNTER — Ambulatory Visit (INDEPENDENT_AMBULATORY_CARE_PROVIDER_SITE_OTHER): Payer: BC Managed Care – PPO | Admitting: Family Medicine

## 2013-12-07 ENCOUNTER — Encounter: Payer: Self-pay | Admitting: Family Medicine

## 2013-12-07 VITALS — BP 104/74 | HR 68 | Temp 98.5°F | Resp 16 | Wt 126.2 lb

## 2013-12-07 DIAGNOSIS — J3089 Other allergic rhinitis: Secondary | ICD-10-CM | POA: Insufficient documentation

## 2013-12-07 DIAGNOSIS — H6120 Impacted cerumen, unspecified ear: Secondary | ICD-10-CM | POA: Insufficient documentation

## 2013-12-07 DIAGNOSIS — J309 Allergic rhinitis, unspecified: Secondary | ICD-10-CM

## 2013-12-07 MED ORDER — FLUTICASONE PROPIONATE 50 MCG/ACT NA SUSP: 2.0000 | Freq: Every day | NASAL | Status: DC

## 2013-12-07 NOTE — Progress Notes (Signed)
   Subjective:    Patient ID: Sierra Hodge, female    DOB: 05-16-1976, 38 y.o.   MRN: [id number]  HPI Ear pain- sxs started the end of January when she had a cold and sinus infection.  tx'd w/ Amoxicillin.  Ears have not improved.  + HA.  + sore throat.  Decreased hearing.  Admits problems w/ allergies but not currently on meds.   Review of Systems For ROS see HPI     Objective:   Physical Exam  Vitals reviewed. Constitutional: She appears well-developed and well-nourished. No distress.  HENT:  Head: Normocephalic and atraumatic.  Right Ear: Tympanic membrane normal.  Left Ear: Tympanic membrane normal.  Nose: Mucosal edema and rhinorrhea present. Right sinus exhibits no maxillary sinus tenderness and no frontal sinus tenderness. Left sinus exhibits no maxillary sinus tenderness and no frontal sinus tenderness.  Mouth/Throat: Mucous membranes are normal. Posterior oropharyngeal erythema (w/ PND) present.  R TM WNL after removal of hard, impacted cerumen w/ curette.  Eyes: Conjunctivae and EOM are normal. Pupils are equal, round, and reactive to light.  Neck: Normal range of motion. Neck supple.  Cardiovascular: Normal rate, regular rhythm and normal heart sounds.   Pulmonary/Chest: Effort normal and breath sounds normal. No respiratory distress. She has no wheezes. She has no rales.  Lymphadenopathy:    She has no cervical adenopathy.          Assessment & Plan:

## 2013-12-07 NOTE — Progress Notes (Signed)
Pre visit review using our clinic review tool, if applicable. No additional management support is needed unless otherwise documented below in the visit note. 

## 2013-12-07 NOTE — Patient Instructions (Signed)
Follow up as needed Start Claritin or Zyrtec daily for the allergy congestion Start the Flonase- 2 sprays each nostril daily- to improve your congestion and ear pain Call with any questions or concerns Hang in there!

## 2013-12-09 NOTE — Assessment & Plan Note (Signed)
New.  Successfully removed w/ curette.

## 2013-12-09 NOTE — Assessment & Plan Note (Signed)
No evidence of infxn.  Suspect this is all due to untreated seasonal allergies.  Start OTC antihistamine and nasal steroid.  Reviewed supportive care and red flags that should prompt return.  Pt expressed understanding and is in agreement w/ plan.

## 2014-01-16 ENCOUNTER — Ambulatory Visit (INDEPENDENT_AMBULATORY_CARE_PROVIDER_SITE_OTHER): Payer: BC Managed Care – PPO | Admitting: Internal Medicine

## 2014-01-16 ENCOUNTER — Encounter: Payer: Self-pay | Admitting: Internal Medicine

## 2014-01-16 VITALS — BP 106/62 | HR 80 | Temp 97.9°F | Wt 130.0 lb

## 2014-01-16 DIAGNOSIS — L408 Other psoriasis: Secondary | ICD-10-CM

## 2014-01-16 DIAGNOSIS — L409 Psoriasis, unspecified: Secondary | ICD-10-CM

## 2014-01-16 DIAGNOSIS — H9209 Otalgia, unspecified ear: Secondary | ICD-10-CM

## 2014-01-16 DIAGNOSIS — L0291 Cutaneous abscess, unspecified: Secondary | ICD-10-CM

## 2014-01-16 DIAGNOSIS — L0390 Cellulitis, unspecified: Secondary | ICD-10-CM

## 2014-01-16 MED ORDER — DOXYCYCLINE HYCLATE 100 MG PO TABS: 100.0000 mg | ORAL_TABLET | Freq: Two times a day (BID) | ORAL | Status: DC

## 2014-01-16 MED ORDER — AZELASTINE HCL 0.1 % NA SOLN: 2.0000 | Freq: Two times a day (BID) | NASAL | Status: DC

## 2014-01-16 MED ORDER — CLOBETASOL PROPIONATE 0.05 % EX OINT: TOPICAL_OINTMENT | Freq: Every day | CUTANEOUS | Status: DC | PRN

## 2014-01-16 NOTE — Progress Notes (Signed)
Subjective:    Patient ID: Sierra Hodge, female    DOB: 01-Oct-1975, 38 y.o.   MRN: [id number]  DOS:  01/16/2014 Type of  visit: Here to discuss several issues Saw dermatology 4 weeks ago due to a dystrophic nail, apparently a sample was obtained and she was told she has a fungal infection and prescribed Jodi GeraldsKerydin, a topic medication. 6 days ago they whole R thumb swell up  And become red, it is better now but not completely well.  Was seen with sinusitis, better but is still has ear discomfort bilaterally.  History of psoriasis, would like a referral to see Dr. Lendon ColonelHawks, she wonders if the nail process is related to psoriasis   ROS Has not seen any discharge from the finger. No fever or chills. + Sinus congestion but no pain. Occasional knee and back pain.   Past Medical History  Diagnosis Date  . Chronic interstitial cystitis   . Hyperlipidemia   . Other psoriasis   . Endometriosis     Past Surgical History  Procedure Laterality Date  . Cesarean section      2008, 2010  . Hernia repair      10/2010  . Tubal ligation      2010  . Tonsillectomy    . Laparoscopy  03/29/2012    Procedure: LAPAROSCOPY OPERATIVE;  Surgeon: Hal MoralesVanessa P Haygood, MD;  Location: [facility];  Service: Gynecology;  Laterality: N/A;  with Fulguration of Endometriosis and Peritoneal Biopsy  . Cysto with hydrodistension  03/29/2012    Procedure: CYSTOSCOPY/HYDRODISTENSION;  Surgeon: Martina SinnerScott A MacDiarmid, MD;  Location: [facility];  Service: Urology;  Laterality: N/A;  with Instillation of Marcaine and Pyridium     History   Social History  . Marital Status: Married    Spouse Name: N/A    Number of Children: 2  . Years of Education: N/A   Occupational History  . college graduate, stay home mom    Social History Main Topics  . Smoking status: Never Smoker   . Smokeless tobacco: Never Used  . Alcohol Use: No     Comment: rarely   . Drug Use: No  . Sexual Activity: Yes    Partners: Male    Birth  Control/ Protection: Surgical     Comment: BTL    Other Topics Concern  . Not on file   Social History Narrative   From [place]         Medication List       This list is accurate as of: 01/16/14  6:14 PM.  Always use your most recent med list.               azelastine 137 MCG/SPRAY nasal spray  Commonly known as:  ASTELIN  Place 2 sprays into both nostrils 2 (two) times daily.     clobetasol ointment 0.05 %  Commonly known as:  TEMOVATE  Apply topically daily as needed.     doxycycline 100 MG tablet  Commonly known as:  VIBRA-TABS  Take 1 tablet (100 mg total) by mouth 2 (two) times daily.     fluticasone 50 MCG/ACT nasal spray  Commonly known as:  FLONASE  Place 2 sprays into both nostrils daily.     ibuprofen 600 MG tablet  Commonly known as:  ADVIL,MOTRIN  1 tablet with food every 6 hours x 3 days then prn-pain     triamcinolone ointment 0.5 %  Commonly known as:  KENALOG  Objective:   Physical Exam BP 106/62  Pulse 80  Temp(Src) 97.9 F (36.6 C)  Wt 130 lb (58.968 kg)  SpO2 100% General -- alert, well-developed, NAD.  HEENT-- Not pale. TMs bulge B but no red-d/c Face symmetric, sinuses not tender to palpation. Nose slt congested. Lungs -- normal respiratory effort, no intercostal retractions, no accessory muscle use, and normal breath sounds.  Heart-- normal rate, regular rhythm, no murmur.   Extremities-- no pretibial edema bilaterally ; Right thumb: Dystrophic nail, no discharge, distal part of the finger is swollen, slightly tender, not warm fluctuant or red. Neurologic--  alert & oriented X3. Speech normal, gait normal, strength normal in all extremities.  Psych-- Cognition and judgment appear intact. Cooperative with normal attention span and concentration. No anxious or depressed appearing.      Assessment & Plan:  Today , I spent more than25 min with the patient, >50% of the time counseling, multiple questions answered to the  best of my ability  Right thumb swelling, cellulitis? Doxycycline for a week, to let me know if not improving.   Otalgia, Suspect serous otitis, continue Claritin, Flonase, and Astelin.

## 2014-01-16 NOTE — Progress Notes (Signed)
Pre visit review using our clinic review tool, if applicable. No additional management support is needed unless otherwise documented below in the visit note. 

## 2014-01-16 NOTE — Patient Instructions (Signed)
Taking the antibiotic as prescribed for one week  In addition to Claritin and Flonase, use Astelin  Sign a release of information form  at the front desk and get the clinic note and labs from your dermatologist Dr. Arita Missawson  Stop the nail medication

## 2014-01-16 NOTE — Assessment & Plan Note (Signed)
Nail dystrophy and history of psoriasis. Not sure if she has a fungal infection, nail dystrophy could be due to psoriasis. We agreed to discontinue Jodi GeraldsKerydin Will get dermatology reports --nail biopsy Refer to rheumatology, Dr. Lendon ColonelHawks who has seen her before.

## 2014-01-25 ENCOUNTER — Telehealth: Payer: Self-pay | Admitting: Internal Medicine

## 2014-01-25 MED ORDER — DOXYCYCLINE HYCLATE 100 MG PO TABS: 100.0000 mg | ORAL_TABLET | Freq: Two times a day (BID) | ORAL | Status: DC

## 2014-01-25 NOTE — Telephone Encounter (Signed)
Little better but still has the redness, swelling and pain.  Please advise.

## 2014-01-25 NOTE — Telephone Encounter (Signed)
Caller name:Dayan Shadoan Relation to NW:GNFAOZHpt:patient Call back number: [phone] Pharmacy:   Reason for call: patient called and stated that her finger is still red and swollen. Also she finished her anabiotic on Tuesday. Please advise.

## 2014-01-25 NOTE — Telephone Encounter (Signed)
Advised patient. Rx [facility] to [facility] [address] Doxy 100mg  bid #10 Follow up scheduled.

## 2014-01-25 NOTE — Telephone Encounter (Signed)
Send 5 additional days of doxycycline. Schedule a visit for next week for a recheck

## 2014-01-30 ENCOUNTER — Telehealth: Payer: Self-pay | Admitting: Internal Medicine

## 2014-01-30 ENCOUNTER — Ambulatory Visit: Payer: BC Managed Care – PPO | Admitting: Internal Medicine

## 2014-01-30 NOTE — Telephone Encounter (Signed)
A user error has taken place.

## 2014-02-01 ENCOUNTER — Ambulatory Visit (INDEPENDENT_AMBULATORY_CARE_PROVIDER_SITE_OTHER): Payer: BC Managed Care – PPO | Admitting: Internal Medicine

## 2014-02-01 ENCOUNTER — Encounter: Payer: Self-pay | Admitting: Internal Medicine

## 2014-02-01 VITALS — BP 83/52 | HR 71 | Temp 98.2°F | Wt 128.0 lb

## 2014-02-01 DIAGNOSIS — B351 Tinea unguium: Secondary | ICD-10-CM | POA: Insufficient documentation

## 2014-02-01 DIAGNOSIS — H9209 Otalgia, unspecified ear: Secondary | ICD-10-CM

## 2014-02-01 DIAGNOSIS — L608 Other nail disorders: Secondary | ICD-10-CM

## 2014-02-01 DIAGNOSIS — L603 Nail dystrophy: Secondary | ICD-10-CM

## 2014-02-01 NOTE — Patient Instructions (Addendum)
Get your blood work before you leave   Next visit as planned

## 2014-02-01 NOTE — Progress Notes (Signed)
Subjective:    Patient ID: Sierra Hodge, female    DOB: January 15, 1976, 38 y.o.   MRN: [id number]  DOS:  02/01/2014 Type of  visit: Follow up from previous visit Went to see the hand surgeon , eventually they removed the fingernail, currently on bactrim, was told it was probably a nail fungal infex and needed oral  medicines     ROS Was seen w/ otalgia, sx resolved Denies nausea, vomiting, diarrhea. No chest pain or difficulty breathing  Past Medical History  Diagnosis Date  . Chronic interstitial cystitis   . Hyperlipidemia   . Other psoriasis   . Endometriosis     Past Surgical History  Procedure Laterality Date  . Cesarean section      2008, 2010  . Hernia repair      10/2010  . Tubal ligation      2010  . Tonsillectomy    . Laparoscopy  03/29/2012    Procedure: LAPAROSCOPY OPERATIVE;  Surgeon: Hal MoralesVanessa P Haygood, MD;  Location: [facility];  Service: Gynecology;  Laterality: N/A;  with Fulguration of Endometriosis and Peritoneal Biopsy  . Cysto with hydrodistension  03/29/2012    Procedure: CYSTOSCOPY/HYDRODISTENSION;  Surgeon: Martina SinnerScott A MacDiarmid, MD;  Location: [facility];  Service: Urology;  Laterality: N/A;  with Instillation of Marcaine and Pyridium     History   Social History  . Marital Status: Married    Spouse Name: N/A    Number of Children: 2  . Years of Education: N/A   Occupational History  . college graduate, stay home mom    Social History Main Topics  . Smoking status: Never Smoker   . Smokeless tobacco: Never Used  . Alcohol Use: No     Comment: rarely   . Drug Use: No  . Sexual Activity: Yes    Partners: Male    Birth Control/ Protection: Surgical     Comment: BTL    Other Topics Concern  . Not on file   Social History Narrative   From [place]         Medication List       This list is accurate as of: 02/01/14 11:59 PM.  Always use your most recent med list.               azelastine 0.1 % nasal spray  Commonly known as:   ASTELIN  Place 2 sprays into both nostrils 2 (two) times daily.     clobetasol ointment 0.05 %  Commonly known as:  TEMOVATE  Apply topically daily as needed.     fluticasone 50 MCG/ACT nasal spray  Commonly known as:  FLONASE  Place 2 sprays into both nostrils daily.     ibuprofen 600 MG tablet  Commonly known as:  ADVIL,MOTRIN  1 tablet with food every 6 hours x 3 days then prn-pain     sulfamethoxazole-trimethoprim 800-160 MG per tablet  Commonly known as:  BACTRIM DS     triamcinolone ointment 0.5 %  Commonly known as:  KENALOG           Objective:   Physical Exam BP 83/52  Pulse 71  Temp(Src) 98.2 F (36.8 C)  Wt 128 lb (58.06 kg)  SpO2 99% General -- alert, well-developed, NAD.  R thumb covered with gause  Psych-- Cognition and judgment appear intact. Cooperative with normal attention span and concentration. No anxious or depressed appearing.          Assessment & Plan:  otlagia , resolved

## 2014-02-01 NOTE — Progress Notes (Signed)
Pre visit review using our clinic review tool, if applicable. No additional management support is needed unless otherwise documented below in the visit note. 

## 2014-02-02 LAB — COMPREHENSIVE METABOLIC PANEL
ALT: 16 U/L (ref 0–35)
AST: 17 U/L (ref 0–37)
Albumin: 4.2 g/dL (ref 3.5–5.2)
Alkaline Phosphatase: 58 U/L (ref 39–117)
BILIRUBIN TOTAL: 0.4 mg/dL (ref 0.2–1.2)
BUN: 15 mg/dL (ref 6–23)
CALCIUM: 9.3 mg/dL (ref 8.4–10.5)
CHLORIDE: 102 meq/L (ref 96–112)
CO2: 23 meq/L (ref 19–32)
Creat: 0.78 mg/dL (ref 0.50–1.10)
Glucose, Bld: 80 mg/dL (ref 70–99)
Potassium: 4 mEq/L (ref 3.5–5.3)
SODIUM: 137 meq/L (ref 135–145)
TOTAL PROTEIN: 6.6 g/dL (ref 6.0–8.3)

## 2014-02-02 NOTE — Assessment & Plan Note (Signed)
Dystrophic nail on the right thumb, status post nail removal  by Dr. Merlyn LotKuzma, pathology pending, he  suspected fungal infection and suggested possibly a medication po.  Plan: CMP Wait for biopsy results, if a fungal infection is confirmed, she will need medication for 3 months.

## 2014-02-06 ENCOUNTER — Encounter: Payer: Self-pay | Admitting: *Deleted

## 2014-02-06 NOTE — Progress Notes (Signed)
Letter with lab results mailed to pt. 

## 2014-02-11 ENCOUNTER — Telehealth: Payer: Self-pay | Admitting: Internal Medicine

## 2014-02-12 ENCOUNTER — Telehealth: Payer: Self-pay | Admitting: *Deleted

## 2014-02-12 MED ORDER — TERBINAFINE HCL 250 MG PO TABS: 250.0000 mg | ORAL_TABLET | Freq: Every day | ORAL | Status: DC

## 2014-02-12 NOTE — Telephone Encounter (Signed)
Caller name:  Naliyah Relation to pt:  self Call back number:   [phone] Pharmacy:  [facility]  Reason for call:   Pt states she did lab test and has received results.  She needs antifungal medication, states Dr[facility] Drue Novel [facility] test results came back.  Please advise.  bw

## 2014-02-12 NOTE — Telephone Encounter (Signed)
I received a report from [place] pathology regardsthe right thumb nail ----> onychomycoses, tah was sent by dermatology 12/06/1913 Plan: Lamisil 250 daily for 6 weeks, followup with me 02/27/2014 , check LFTs then

## 2014-02-12 NOTE — Telephone Encounter (Signed)
i did this morning , see previous phone note

## 2014-02-13 NOTE — Telephone Encounter (Signed)
Already addressed

## 2014-02-27 ENCOUNTER — Encounter: Payer: Self-pay | Admitting: Internal Medicine

## 2014-02-27 ENCOUNTER — Ambulatory Visit (INDEPENDENT_AMBULATORY_CARE_PROVIDER_SITE_OTHER): Payer: BC Managed Care – PPO | Admitting: Internal Medicine

## 2014-02-27 VITALS — BP 105/70 | HR 60 | Temp 98.0°F | Ht 58.6 in | Wt 126.0 lb

## 2014-02-27 DIAGNOSIS — Z23 Encounter for immunization: Secondary | ICD-10-CM

## 2014-02-27 DIAGNOSIS — B351 Tinea unguium: Secondary | ICD-10-CM

## 2014-02-27 DIAGNOSIS — Z0000 Encounter for general adult medical examination without abnormal findings: Secondary | ICD-10-CM | POA: Insufficient documentation

## 2014-02-27 LAB — LIPID PANEL
CHOL/HDL RATIO: 4
Cholesterol: 210 mg/dL — ABNORMAL HIGH (ref 0–200)
HDL: 54.1 mg/dL (ref >39.00)
LDL CALC: 143 mg/dL — AB (ref 0–99)
NONHDL: 155.9
TRIGLYCERIDES: 64 mg/dL (ref 0.0–149.0)
VLDL: 12.8 mg/dL (ref 0.0–40.0)

## 2014-02-27 LAB — AST: AST: 22 U/L (ref 0–37)

## 2014-02-27 LAB — ALT: ALT: 23 U/L (ref 0–35)

## 2014-02-27 MED ORDER — TERBINAFINE HCL 250 MG PO TABS: 250.0000 mg | ORAL_TABLET | Freq: Every day | ORAL | Status: DC

## 2014-02-27 NOTE — Assessment & Plan Note (Signed)
TD?  Provided one today Sees gyn for female care , reports recent (-) PAP Diet-exercise-- doing better, praised Labs

## 2014-02-27 NOTE — Patient Instructions (Signed)
Get your blood work before you leave   Take Lamisil for a total of 6 weeks, then stop   Next visit is for routine check up regards the nail infection in 3 months, please make an appointment

## 2014-02-27 NOTE — Progress Notes (Signed)
Pre visit review using our clinic review tool, if applicable. No additional management support is needed unless otherwise documented below in the visit note. 

## 2014-02-27 NOTE — Assessment & Plan Note (Addendum)
On 02-12-14 received a report from [facility] pathology regards the right thumb nail ----> onychomycoses (sent by dermatology 12/06/1913 ) Started Lamisil 250 daily for 6 weeks, Good compliance without apparent side effects Additionally, as thumb continued to be slightly swollen and red, she went to see Dr. Merlyn Lot 02-18-14, note reviewed, nail Cx grown enterobacter and group B strep,  Pr reports was rx  Bactrim, plans to see him this week for a checkup (note did not mention bactrim) Plan: Continue lamisil, LFTs, see Dr Kirtland Bouchard as planned , RTC 3 months

## 2014-02-27 NOTE — Progress Notes (Signed)
Subjective:    Patient ID: Sierra Hodge, female    DOB: Mar 23, 1976, 38 y.o.   MRN: [id number]  DOS:  02/27/2014 Type of  Visit: CPX History:  In general feeling well, she still has some issues with the right thumb, see assessment and plan   ROS Diet-- trying to eat healthy, lost 4 pounds  Exercise-- walks-bike regulalrly No  CP, SOB Denies  nausea, vomiting diarrhea, blood in the stools (-) cough, sputum production (-) wheezing, chest congestion  no dysuria, gross hematuria, difficulty urinating   No anxiety, depression    Past Medical History  Diagnosis Date  . Chronic interstitial cystitis   . Hyperlipidemia   . Other psoriasis   . Endometriosis     Past Surgical History  Procedure Laterality Date  . Cesarean section      2008, 2010  . Hernia repair      10/2010  . Tubal ligation      2010  . Tonsillectomy    . Laparoscopy  03/29/2012    Procedure: LAPAROSCOPY OPERATIVE;  Surgeon: Hal MoralesVanessa P Haygood, MD;  Location: [facility];  Service: Gynecology;  Laterality: N/A;  with Fulguration of Endometriosis and Peritoneal Biopsy  . Cysto with hydrodistension  03/29/2012    Procedure: CYSTOSCOPY/HYDRODISTENSION;  Surgeon: Martina SinnerScott A MacDiarmid, MD;  Location: [facility];  Service: Urology;  Laterality: N/A;  with Instillation of Marcaine and Pyridium     History   Social History  . Marital Status: Married    Spouse Name: N/A    Number of Children: 2  . Years of Education: N/A   Occupational History  . college graduate, stay home mom    Social History Main Topics  . Smoking status: Never Smoker   . Smokeless tobacco: Never Used  . Alcohol Use: 0.6 oz/week    1 Glasses of wine per week     Comment: rarely   . Drug Use: No  . Sexual Activity: Yes    Partners: Male    Birth Control/ Protection: Surgical     Comment: BTL    Other Topics Concern  . Not on file   Social History Narrative   From [place]      Family History  Problem Relation Age of Onset  . Heart  disease Other     paternal family  . Diabetes Paternal Uncle   . Colon cancer Neg Hx   . Breast cancer Neg Hx        Medication List       This list is accurate as of: 02/27/14 11:59 PM.  Always use your most recent med list.               azelastine 0.1 % nasal spray  Commonly known as:  ASTELIN  Place 2 sprays into both nostrils 2 (two) times daily as needed.     clobetasol ointment 0.05 %  Commonly known as:  TEMOVATE  Apply topically daily as needed.     fluticasone 50 MCG/ACT nasal spray  Commonly known as:  FLONASE  Place 2 sprays into both nostrils daily.     ibuprofen 600 MG tablet  Commonly known as:  ADVIL,MOTRIN  1 tablet with food every 6 hours x 3 days then prn-pain     sulfamethoxazole-trimethoprim 800-160 MG per tablet  Commonly known as:  BACTRIM DS     terbinafine 250 MG tablet  Commonly known as:  LAMISIL  Take 1 tablet (250 mg total) by mouth daily.  triamcinolone ointment 0.5 %  Commonly known as:  KENALOG           Objective:   Physical Exam BP 105/70  Pulse 60  Temp(Src) 98 F (36.7 C)  Ht 4' 10.6" (1.488 m)  Wt 126 lb (57.153 kg)  BMI 25.81 kg/m2  SpO2 94%  General -- alert, well-developed, NAD.  Neck --no thyromegaly  HEENT-- Not pale.  Lungs -- normal respiratory effort, no intercostal retractions, no accessory muscle use, and normal breath sounds.  Heart-- normal rate, regular rhythm, no murmur.  Abdomen-- Not distended, good bowel sounds,soft, non-tender. Extremities-- no pretibial edema bilaterally  Right thumb: mils redness-swelling proximal to nail bed, small dystrophic nail noted  Neurologic--  alert & oriented X3. Speech normal, gait appropriate for age, strength symmetric and appropriate for age.   Psych-- Cognition and judgment appear intact. Cooperative with normal attention span and concentration. No anxious or depressed appearing.       Assessment & Plan:

## 2014-02-28 ENCOUNTER — Encounter: Payer: Self-pay | Admitting: *Deleted

## 2014-04-02 ENCOUNTER — Other Ambulatory Visit: Payer: Self-pay | Admitting: Internal Medicine

## 2014-05-08 ENCOUNTER — Other Ambulatory Visit: Payer: Self-pay | Admitting: Internal Medicine

## 2014-05-28 ENCOUNTER — Encounter: Payer: Self-pay | Admitting: Internal Medicine

## 2014-06-11 ENCOUNTER — Encounter: Payer: Self-pay | Admitting: Internal Medicine

## 2014-06-11 ENCOUNTER — Ambulatory Visit (INDEPENDENT_AMBULATORY_CARE_PROVIDER_SITE_OTHER): Payer: BC Managed Care – PPO | Admitting: Internal Medicine

## 2014-06-11 VITALS — BP 108/64 | HR 56 | Temp 98.4°F | Wt 130.2 lb

## 2014-06-11 DIAGNOSIS — R1033 Periumbilical pain: Secondary | ICD-10-CM

## 2014-06-11 DIAGNOSIS — B351 Tinea unguium: Secondary | ICD-10-CM

## 2014-06-11 LAB — ALT: ALT: 24 U/L (ref 0–35)

## 2014-06-11 LAB — AST: AST: 21 U/L (ref 0–37)

## 2014-06-11 NOTE — Assessment & Plan Note (Addendum)
See previous entry, status post Lamisil and Bactrim. The nail grew back ~04-2014 after it was excised and it look slightly dystrophic, went to see another dermatologist last month, a biopsy was done--- told there is no fungal infection, was referred to rheumatology for possible psoriasis of the nail. Plan: From my standpoint recommend no further intervention except to see rheumatology.

## 2014-06-11 NOTE — Progress Notes (Signed)
Pre visit review using our clinic review tool, if applicable. No additional management support is needed unless otherwise documented below in the visit note. 

## 2014-06-11 NOTE — Assessment & Plan Note (Signed)
See history of present illness, chronic umbilical pain, exam is benign, previously eval by urology and gynecology with no clear diagnosis. Scar tissue related to previous umbilical hernia repair? For now recommend observation, reassurance. To call if pain severe, bulging area, unexplained weight loss or blood in the stools

## 2014-06-11 NOTE — Patient Instructions (Signed)
Get your blood work before you leave    Please come back to the office by 02-2015  for a physical exam. Come back fasting    

## 2014-06-11 NOTE — Progress Notes (Signed)
Subjective:    Patient ID: Sierra Hodge, female    DOB: Mar 20, 1976, 38 y.o.   MRN: [id number]  DOS:  06/11/2014 Type of visit - description : f/u Interval history: Still concerned about her right thumb nail, see assessment and plan.  Still concerned about her liver, it was a slightly elevated at one point, she took Lamisil and liked to be rechecked.  Also having on-and-off umbilical pain since 2012 when she had a the umbilical hernia repair. Has seen urology before and no diagnoses was made. Recently saw gynecology, the prescribe a trial with birth control pills for possible endometriosis. The pain is sharp, random, on and off, not severe, not related with food   ROS Denies nausea, vomiting, diarrhea or blood in the stools. No major problems with distress, anxiety or depression  Past Medical History  Diagnosis Date  . Chronic interstitial cystitis   . Hyperlipidemia   . Other psoriasis   . Endometriosis     Past Surgical History  Procedure Laterality Date  . Cesarean section      2008, 2010  . Hernia repair      10/2010  . Tubal ligation      2010  . Tonsillectomy    . Laparoscopy  03/29/2012    Procedure: LAPAROSCOPY OPERATIVE;  Surgeon: Hal Morales, MD;  Location: [facility];  Service: Gynecology;  Laterality: N/A;  with Fulguration of Endometriosis and Peritoneal Biopsy  . Cysto with hydrodistension  03/29/2012    Procedure: CYSTOSCOPY/HYDRODISTENSION;  Surgeon: Martina Sinner, MD;  Location: [facility];  Service: Urology;  Laterality: N/A;  with Instillation of Marcaine and Pyridium     History   Social History  . Marital Status: Married    Spouse Name: N/A    Number of Children: 2  . Years of Education: N/A   Occupational History  . college graduate, stay home mom    Social History Main Topics  . Smoking status: Never Smoker   . Smokeless tobacco: Never Used  . Alcohol Use: 0.6 oz/week    1 Glasses of wine per week     Comment: rarely   . Drug  Use: No  . Sexual Activity: Yes    Partners: Male    Birth Control/ Protection: Surgical     Comment: BTL    Other Topics Concern  . Not on file   Social History Narrative   From [place]         Medication List       This list is accurate as of: 06/11/14  9:25 PM.  Always use your most recent med list.               clobetasol ointment 0.05 %  Commonly known as:  TEMOVATE  APPLY EVERY DAY AS NEEDED     fluticasone 50 MCG/ACT nasal spray  Commonly known as:  FLONASE  Place 2 sprays into both nostrils daily.     ORSYTHIA 0.1-20 MG-MCG tablet  Generic drug:  levonorgestrel-ethinyl estradiol  Take 1 tablet by mouth daily.           Objective:   Physical Exam BP 108/64  Pulse 56  Temp(Src) 98.4 F (36.9 C) (Oral)  Wt 130 lb 4 oz (59.081 kg)  SpO2 97%  LMP 06/06/2014  General -- alert, well-developed, NAD.  Abdomen-- Not distended, good bowel sounds,soft, non-tender. Umbilicus w/o hernia-mass No rebound or rigidity. No mass,organomegaly. Extremities-- no pretibial edema bilaterally; Life: Single female looks healthy,  distal nail is slightly dystrophic. The finger is minimal he swollen, no red or warm. Neurologic--  alert & oriented X3. Speech normal, gait appropriate for age, strength symmetric and appropriate for age.  Psych-- Cognition and judgment appear intact. Cooperative with normal attention span and concentration.Aprehensive  appearing.     Assessment & Plan:

## 2014-07-22 ENCOUNTER — Encounter: Payer: Self-pay | Admitting: Internal Medicine

## 2015-02-19 LAB — HM MAMMOGRAPHY

## 2015-03-03 ENCOUNTER — Encounter: Payer: Self-pay | Admitting: *Deleted

## 2015-03-03 ENCOUNTER — Telehealth: Payer: Self-pay | Admitting: *Deleted

## 2015-03-03 NOTE — Telephone Encounter (Signed)
Pre-Visit Call completed with patient and chart updated.   Pre-Visit Info documented in Specialty Comments under [employer].    

## 2015-03-04 ENCOUNTER — Ambulatory Visit (INDEPENDENT_AMBULATORY_CARE_PROVIDER_SITE_OTHER): Payer: BLUE CROSS/BLUE SHIELD | Admitting: Internal Medicine

## 2015-03-04 ENCOUNTER — Encounter: Payer: Self-pay | Admitting: Internal Medicine

## 2015-03-04 VITALS — BP 118/68 | HR 62 | Temp 98.3°F | Ht 59.0 in | Wt 134.4 lb

## 2015-03-04 DIAGNOSIS — Z0000 Encounter for general adult medical examination without abnormal findings: Secondary | ICD-10-CM

## 2015-03-04 LAB — CBC WITH DIFFERENTIAL/PLATELET
Basophils Absolute: 0 10*3/uL (ref 0.0–0.1)
Basophils Relative: 0.4 % (ref 0.0–3.0)
EOS PCT: 1.4 % (ref 0.0–5.0)
Eosinophils Absolute: 0.1 10*3/uL (ref 0.0–0.7)
HEMATOCRIT: 40.7 % (ref 36.0–46.0)
Hemoglobin: 13.7 g/dL (ref 12.0–15.0)
LYMPHS ABS: 1.6 10*3/uL (ref 0.7–4.0)
Lymphocytes Relative: 27 % (ref 12.0–46.0)
MCHC: 33.6 g/dL (ref 30.0–36.0)
MCV: 85.3 fl (ref 78.0–100.0)
MONOS PCT: 5.7 % (ref 3.0–12.0)
Monocytes Absolute: 0.3 10*3/uL (ref 0.1–1.0)
NEUTROS ABS: 4 10*3/uL (ref 1.4–7.7)
Neutrophils Relative %: 65.5 % (ref 43.0–77.0)
PLATELETS: 216 10*3/uL (ref 150.0–400.0)
RBC: 4.77 Mil/uL (ref 3.87–5.11)
RDW: 15.5 % (ref 11.5–15.5)
WBC: 6.1 10*3/uL (ref 4.0–10.5)

## 2015-03-04 LAB — LIPID PANEL
CHOL/HDL RATIO: 4
Cholesterol: 210 mg/dL — ABNORMAL HIGH (ref 0–200)
HDL: 49.2 mg/dL (ref >39.00)
LDL Cholesterol: 149 mg/dL — ABNORMAL HIGH (ref 0–99)
NONHDL: 160.8
Triglycerides: 58 mg/dL (ref 0.0–149.0)
VLDL: 11.6 mg/dL (ref 0.0–40.0)

## 2015-03-04 LAB — TSH: TSH: 1.64 u[IU]/mL (ref 0.35–4.50)

## 2015-03-04 MED ORDER — CLOBETASOL PROPIONATE 0.05 % EX OINT: TOPICAL_OINTMENT | Freq: Every day | CUTANEOUS | Status: DC | PRN

## 2015-03-04 NOTE — Assessment & Plan Note (Addendum)
TD 2015  Never had a cscope  Sees gyn for female care , reports recent (-) PAP- [facility]  Diet-exercise-- doing better, praised Labs  Other issues : Numerous simple nevi noted, patient provided information about moles that should be examined.  Labs -- FLP/CBC/TSH

## 2015-03-04 NOTE — Progress Notes (Signed)
Subjective:    Patient ID: Sierra Hodge, female    DOB: [birth year], 39 y.o.   MRN: [id number]  DOS:  03/04/2015 Type of visit - description : cpx Interval history:  Patient is a 39 year old female with a history of onychomycosis in today for her annual physical exam.   Vision changes: Patient has noted recent difficulty focusing with her eyes while reading and occasionally while watching tv. She says that occasionally her right eye will appear red from straining. She has a history of needing reading glasses but has not used them in over 10 years. Additionally has not had her vision checked in over 10 years. Denies any photophobia or double vision. Denies dizziness.  Toe Fx: Patient seen in urgent care on 02/08/15 after twisting her left big toe. Per chart review, X-ray at that time showed possible fracture and patient was advised to follow with orthopedics in 7-10 days, who said it was not fractured, only sprained. Today (4 wks later) patient still having difficulty walking on and bending toe.     Review of Systems  Constitutional: No fever. No chills. No unexplained wt changes. No unusual sweats  HEENT: No dental problems, no ear discharge, no facial swelling, no voice changes. No eye discharge, no intolerance to light   Respiratory: No wheezing, no difficulty breathing. No cough, no mucus production  Cardiovascular: No CP, no leg swelling, no palpitations  GI: no nausea, no vomiting, no diarrhea , no  abdominal pain.  No blood in the stools. No dysphagia, no odynophagia    Endocrine: No polyphagia, no polyuria , no polydipsia  GU: No dysuria, gross hematuria, difficulty urinating. No urinary urgency, no frequency.  Musculoskeletal: R thumb slightly swollen at PIP joint  Skin: No change in the color of the skin, palor , no  Rash  Allergic, immunologic: No environmental allergies , no  food allergies  Neurological: No dizziness no  syncope. No headaches. No diplopia, no  slurred, no slurred speech, no motor deficits, no facial  Numbness  Hematological: No enlarged lymph nodes, no easy bruising , no unusual bleedings  Psychiatry: No suicidal ideas, no hallucinations, no beavior problems, no confusion.  No unusual/severe anxiety, no depression    Past Medical History  Diagnosis Date  . Chronic interstitial cystitis   . Hyperlipidemia   . Other psoriasis   . Endometriosis   . IUD (intrauterine device) in place     Mirena, Dr. Pennie Rushing    Past Surgical History  Procedure Laterality Date  . Cesarean section      2008, 2010  . Hernia repair      10/2010  . Tubal ligation  2010  . Tonsillectomy    . Laparoscopy  03/29/2012    Procedure: LAPAROSCOPY OPERATIVE;  Surgeon: Hal Morales, MD;  Location: [facility];  Service: Gynecology;  Laterality: N/A;  with Fulguration of Endometriosis and Peritoneal Biopsy  . Cysto with hydrodistension  03/29/2012    Procedure: CYSTOSCOPY/HYDRODISTENSION;  Surgeon: Martina Sinner, MD;  Location: [facility];  Service: Urology;  Laterality: N/A;  with Instillation of Marcaine and Pyridium     History   Social History  . Marital Status: Married    Spouse Name: N/A  . Number of Children: 2  . Years of Education: N/A   Occupational History  . college graduate, stay home mom    Social History Main Topics  . Smoking status: Never Smoker   . Smokeless tobacco: Never Used  . Alcohol  Use: 0.6 oz/week    1 Glasses of wine per week     Comment: rarely   . Drug Use: No  . Sexual Activity:    Partners: Male    Birth Control/ Protection: Surgical     Comment: BTL    Other Topics Concern  . Not on file   Social History Narrative   From [place]      Family History  Problem Relation Age of Onset  . Heart disease Other     paternal family  . Diabetes Paternal Uncle   . Colon cancer Neg Hx   . Breast cancer Neg Hx        Medication List       This list is accurate as of: 03/04/15  7:55 PM.  Always use  your most recent med list.               clobetasol ointment 0.05 %  Commonly known as:  TEMOVATE  Apply topically daily as needed.     fluticasone 50 MCG/ACT nasal spray  Commonly known as:  FLONASE  Place 2 sprays into both nostrils daily.     levonorgestrel 20 MCG/24HR IUD  Commonly known as:  MIRENA  1 each by Intrauterine route once.     pentosan polysulfate 100 MG capsule  Commonly known as:  ELMIRON  Take 100 mg by mouth 2 (two) times daily. rx per gyn           Objective:   Physical Exam BP 118/68 mmHg  Pulse 62  Temp(Src) 98.3 F (36.8 C) (Oral)  Ht 4\' 11"  (1.499 m)  Wt 134 lb 6 oz (60.952 kg)  BMI 27.13 kg/m2  SpO2 98%  General:   Well developed, well nourished . NAD.  Neck:  Full range of motion. Supple. No thyromegaly. HEENT:  Normocephalic . Face symmetric, atraumatic Lungs:  CTA B Normal respiratory effort, no intercostal retractions, no accessory muscle use. Heart: RRR, no murmur.  No pretibial edema bilaterally  Abdomen:  Not distended, soft, non-tender. No rebound or rigidity. No mass,organomegaly Skin: Exposed areas without rash. Not pale. Not jaundice. Numerous simple nevi noted.  MSK: Left great  toe taped, no obvious erythema or swelling Neurologic:  alert & oriented X3.  Speech normal, gait appropriate for age and unassisted Psych: Cognition and judgment appear intact.  Cooperative with normal attention span and concentration.  Behavior appropriate. No anxious or depressed appearing.     Assessment & Plan:   (Patient seen along with   Merilynn Finland, medical student)     Vision Change: Patient notes difficulty focusing and straining her eyes while reading and occasionally while watching TV. Denies any dizziness. She notes she hasn't had her vision checked in 10 years and would like referral to ophthalmology.  Plan: Will refer to ophthalmology  Toe Fx:  Patient seen in urgent care for suspected fracture of L big toe  where they told her to follow up with ortho. Per patient, ortho said it was not a fracture but that she sprained her toe.  Patient notes 4 weeks later her toe is still painful and she has difficulty bending it. Joint not red, warm, or inflamed.  Plan: Patient instructed to follow up with ortho for additional care of her toe.

## 2015-03-04 NOTE — Progress Notes (Signed)
Pre visit review using our clinic review tool, if applicable. No additional management support is needed unless otherwise documented below in the visit note. 

## 2015-03-04 NOTE — Patient Instructions (Signed)
Get your blood work    [employer] are usually harmless growths on the skin. They are accumulations of color (pigment) cells in the skin that:   Can be various colors, from light brown to black.  Can appear anywhere on the body.  May remain flat or become raised.  May contain hairs.  May remain smooth or develop wrinkling. Most moles are not cancerous (benign). However, some moles may develop changes and become cancerous. It is important to check your moles every month. If you check your moles regularly, you will be able to notice any changes that may occur.  CAUSES  Moles occur when skin cells grow together in clusters instead of spreading out in the skin as they normally do. The reason for this clustering is unknown. DIAGNOSIS  Your caregiver will perform a skin examination to diagnose your mole.  TREATMENT  Moles usually do not require treatment. If a mole becomes worrisome, your caregiver may choose to take a sample of the mole or remove it entirely, and then send it to a lab for examination.  HOME CARE INSTRUCTIONS  Check your mole(s) monthly for changes that may indicate skin cancer. These changes can include:  A change in size.  A change in color. Note that moles tend to darken during pregnancy or when taking birth control pills (oral contraception).  A change in shape.  A change in the border of the mole.  Wear sunscreen (with an SPF of at least 30) when you spend long periods of time outside. Reapply the sunscreen every 2-3 hours.  Schedule annual appointments with your skin doctor (dermatologist) if you have a large number of moles. SEEK MEDICAL CARE IF:  Your mole changes size, especially if it becomes larger than a pencil eraser.  Your mole changes in color or develops more than one color.  Your mole becomes itchy or bleeds.  Your mole, or the skin near the mole, becomes painful, sore, red, or swollen.  Your mole becomes scaly, sheds skin, or oozes  fluid.  Your mole develops irregular borders.  Your mole becomes flat or develops raised areas.  Your mole becomes hard or soft. Document Released: 06/01/2001 Document Revised: 05/31/2012 Document Reviewed: 03/20/2012 [facility] Patient Information 2015 [place], [place]. This information is not intended to replace advice given to you by your health care provider. Make sure you discuss any questions you have with your health care provider.

## 2015-03-11 ENCOUNTER — Encounter: Payer: Self-pay | Admitting: Internal Medicine

## 2015-08-22 ENCOUNTER — Encounter: Payer: Self-pay | Admitting: Medical

## 2015-08-22 ENCOUNTER — Ambulatory Visit (INDEPENDENT_AMBULATORY_CARE_PROVIDER_SITE_OTHER): Payer: BLUE CROSS/BLUE SHIELD | Admitting: Medical

## 2015-08-22 VITALS — BP 88/60 | HR 71 | Temp 98.2°F | Ht 59.0 in | Wt 133.0 lb

## 2015-08-22 DIAGNOSIS — J0100 Acute maxillary sinusitis, unspecified: Secondary | ICD-10-CM | POA: Diagnosis not present

## 2015-08-22 DIAGNOSIS — R319 Hematuria, unspecified: Secondary | ICD-10-CM | POA: Diagnosis not present

## 2015-08-22 DIAGNOSIS — M549 Dorsalgia, unspecified: Secondary | ICD-10-CM | POA: Diagnosis not present

## 2015-08-22 DIAGNOSIS — R252 Cramp and spasm: Secondary | ICD-10-CM | POA: Diagnosis not present

## 2015-08-22 LAB — POCT URINALYSIS DIPSTICK
Bilirubin, UA: NEGATIVE
Glucose, UA: NEGATIVE
Ketones, UA: NEGATIVE
Leukocytes, UA: NEGATIVE
NITRITE UA: NEGATIVE
PH UA: 6
PROTEIN UA: NEGATIVE
SPEC GRAV UA: 1.02
UROBILINOGEN UA: 0.2

## 2015-08-22 LAB — COMPREHENSIVE METABOLIC PANEL
ALBUMIN: 4.3 g/dL (ref 3.5–5.2)
ALT: 24 U/L (ref 0–35)
AST: 17 U/L (ref 0–37)
Alkaline Phosphatase: 59 U/L (ref 39–117)
BILIRUBIN TOTAL: 0.8 mg/dL (ref 0.2–1.2)
BUN: 15 mg/dL (ref 6–23)
CALCIUM: 9.4 mg/dL (ref 8.4–10.5)
CO2: 27 meq/L (ref 19–32)
CREATININE: 0.56 mg/dL (ref 0.40–1.20)
Chloride: 102 mEq/L (ref 96–112)
GFR: 126.82 mL/min (ref >60.00)
Glucose, Bld: 88 mg/dL (ref 70–99)
Potassium: 3.9 mEq/L (ref 3.5–5.1)
Sodium: 137 mEq/L (ref 135–145)
Total Protein: 7.3 g/dL (ref 6.0–8.3)

## 2015-08-22 LAB — MAGNESIUM: Magnesium: 1.8 mg/dL (ref 1.5–2.5)

## 2015-08-22 MED ORDER — SUMATRIPTAN SUCCINATE 50 MG PO TABS: ORAL_TABLET | ORAL | Status: DC

## 2015-08-22 MED ORDER — AZITHROMYCIN 250 MG PO TABS: ORAL_TABLET | ORAL | Status: DC

## 2015-08-22 MED ORDER — FLUTICASONE PROPIONATE 50 MCG/ACT NA SUSP: 2.0000 | Freq: Every day | NASAL | Status: DC

## 2015-08-22 NOTE — Patient Instructions (Addendum)
You appear to have sinus infection by exam and history. Rx flonase nasal spray and zpack antibiotic.  For your muscle cramp and low k history will repeat cmp and get mg level.  You have hematuria hx. But with recent back pain would consider work up for kidney stone if you get pain over kidneys/cva pain. On today exam mid lumbar faint pain. You attribute to endometriosis. But if mid pain persist recommend lumbar spine xray.  You don't have ha now. But some light sensitivy yesterday with scalp tenderness(yesterday but not present today). If this returns with ha then can take imitrex. If at any point ha with  Neuro signs or symptoms then ED evaluation.  Follow up in 7 days or as needed

## 2015-08-22 NOTE — Progress Notes (Signed)
Subjective:    Patient ID: Sierra Hodge, female    DOB: 1976-05-11, 39 y.o.   MRN: [id number]  HPI   Pt in in with some recent frontal and sinus pressure.Symptoms for 3 days. Mild  runny nose. No pnd. She states if she does not treat herself quickly will get sinus infection. Pt thinks last year got 3 sinus infection. Yesterday scalp was mild tender.  Pt also states she has some mild lower back pain(for one week). She thinks maybe related to endometriosis. Some mild lower back pain. Not described as cva pain. Pt reports no injury or fall. Pt states in past has had back pain associated with cycles in the past. Pt has mirena now.No pain in her legs. LMP- year ago.(she was on ocp but painful menses). So switched to mirena.   At end mentioned some mild twitching lt lower eye lid. Occurs sporadically and transient. Not associate with other signs or symptoms.   Review of Systems  Constitutional: Negative for fever, chills and fatigue.  HENT: Positive for congestion, rhinorrhea and sinus pressure. Negative for ear pain.   Gastrointestinal: Negative for nausea, vomiting and abdominal pain.  Musculoskeletal:       Slight twitch/crampl left lower eye lid.  Neurological: Negative for dizziness, seizures, weakness, numbness and headaches.       Slight light sensitive yesterday but not today. Top of scalp mild tender yesterday.  Slgiht faint transient occasional dizzy.  Hematological: Negative for adenopathy. Does not bruise/bleed easily.  Psychiatric/Behavioral: Negative for behavioral problems and confusion.    Past Medical History  Diagnosis Date  . Chronic interstitial cystitis   . Hyperlipidemia   . Other psoriasis   . Endometriosis   . IUD (intrauterine device) in place     Mirena, Dr. Pennie Rushing    Social History   Social History  . Marital Status: Married    Spouse Name: N/A  . Number of Children: 2  . Years of Education: N/A   Occupational History  . college graduate,  stay home mom    Social History Main Topics  . Smoking status: Never Smoker   . Smokeless tobacco: Never Used  . Alcohol Use: 0.6 oz/week    1 Glasses of wine per week     Comment: rarely   . Drug Use: No  . Sexual Activity:    Partners: Male    Birth Control/ Protection: Surgical     Comment: BTL    Other Topics Concern  . Not on file   Social History Narrative   From [place]     Past Surgical History  Procedure Laterality Date  . Cesarean section      2008, 2010  . Hernia repair      10/2010  . Tubal ligation  2010  . Tonsillectomy    . Laparoscopy  03/29/2012    Procedure: LAPAROSCOPY OPERATIVE;  Surgeon: Hal Morales, MD;  Location: [facility];  Service: Gynecology;  Laterality: N/A;  with Fulguration of Endometriosis and Peritoneal Biopsy  . Cysto with hydrodistension  03/29/2012    Procedure: CYSTOSCOPY/HYDRODISTENSION;  Surgeon: Martina Sinner, MD;  Location: [facility];  Service: Urology;  Laterality: N/A;  with Instillation of Marcaine and Pyridium     Family History  Problem Relation Age of Onset  . Heart disease Other     paternal family  . Diabetes Paternal Uncle   . Colon cancer Neg Hx   . Breast cancer Neg Hx  Allergies  Allergen Reactions  . Morphine And Related     Current Outpatient Prescriptions on File Prior to Visit  Medication Sig Dispense Refill  . clobetasol ointment (TEMOVATE) 0.05 % Apply topically daily as needed. 60 g 1  . fluticasone (FLONASE) 50 MCG/ACT nasal spray Place 2 sprays into both nostrils daily. 16 g 6  . levonorgestrel (MIRENA) 20 MCG/24HR IUD 1 each by Intrauterine route once.    . pentosan polysulfate (ELMIRON) 100 MG capsule Take 100 mg by mouth 2 (two) times daily. rx per gyn     No current facility-administered medications on file prior to visit.    BP 88/60 mmHg  Pulse 71  Temp(Src) 98.2 F (36.8 C) (Oral)  Ht 4\' 11"  (1.499 m)  Wt 133 lb (60.328 kg)  BMI 26.85 kg/m2  SpO2 97%       Objective:    Physical Exam  General Mental Status- Alert. General Appearance- Not in acute distress.     Skin Rashes- No Rashes.  HEENT Head- Normal. Ear Auditory Canal - Left- Normal. Right - Normal.Tympanic Membrane- Left- Normal. Right- Normal. Eye Sclera/Conjunctiva- Left- Normal. Right- Normal. Nose & Sinuses Nasal Mucosa- Left-  Boggy and Congested. Right-  Boggy and  Congested.Bilateral  Maxillary sinus pressure but no frontal sinus pressure. Mouth & Throat Lips: Upper Lip- Normal: no dryness, cracking, pallor, cyanosis, or vesicular eruption. Lower Lip-Normal: no dryness, cracking, pallor, cyanosis or vesicular eruption. Buccal Mucosa- Bilateral- No Aphthous ulcers. Oropharynx- No Discharge or Erythema. Tonsils: Characteristics- Bilateral- No Erythema or Congestion. Size/Enlargement- Bilateral- No enlargement. Discharge- bilateral-None.   Skin General: Color- Normal Color. Moisture- Normal Moisture.  Neck Carotid Arteries- Normal color. Moisture- Normal Moisture. No carotid bruits. No JVD.  Chest and Lung Exam Auscultation: Breath Sounds:-Normal.  Cardiovascular Auscultation:Rythm- Regular. Murmurs & Other Heart Sounds:Auscultation of the heart reveals- No Murmurs.  Abdomen Inspection:-Inspeection Normal. Palpation/Percussion:Note:No mass. Palpation and Percussion of the abdomen reveal- Non Tender, Non Distended + BS, no rebound or guarding.    Neurologic Cranial Nerve exam:- CN III-XII intact(No nystagmus), symmetric smile. No light sensitivity  Drift Test:- No drift. Finger to Nose:- Normal/Intact Strength:- 5/5 equal and symmetric strength both upper and lower extremities.  Back- no cva tenderness(mild lower back pain mid on exam) Pt stated feels like endometriosis pain.  Eyes- pt has transient occasional left lower eye lid twitch that occurred twice during exam and interview.      Assessment & Plan:  You appear to have sinus infection by exam and history. Rx  flonase nasal spray and zpack antibiotic.  For your muscle cramp and low k history will repeat cmp and get mg level.  You have hematuria hx. But with recent back pain would consider work up for kidney stone if you get pain over kidneys/cva pain. On today exam mid lumbar faint pain. You attribute to endometriosis. But if mid pain persist recommend lumbar spine xray.  You don't have ha now. But some light sensitivy yesterday with scalp tenderness(yesterday but not present today). If this returns with ha then can take imitrex. If at any point ha with  Neuro signs or symptoms then ED evaluation.  Follow up in 7 days or as

## 2015-08-22 NOTE — Progress Notes (Signed)
Pre visit review using our clinic review tool, if applicable. No additional management support is needed unless otherwise documented below in the visit note. 

## 2015-08-23 LAB — URINE CULTURE
Colony Count: NO GROWTH
ORGANISM ID, BACTERIA: NO GROWTH

## 2015-10-07 LAB — HM PAP SMEAR

## 2015-10-13 ENCOUNTER — Encounter: Payer: Self-pay | Admitting: Internal Medicine

## 2016-03-09 ENCOUNTER — Encounter: Payer: BLUE CROSS/BLUE SHIELD | Admitting: Internal Medicine

## 2016-06-22 ENCOUNTER — Encounter: Payer: Self-pay | Admitting: Internal Medicine

## 2016-06-22 ENCOUNTER — Ambulatory Visit (INDEPENDENT_AMBULATORY_CARE_PROVIDER_SITE_OTHER): Payer: BLUE CROSS/BLUE SHIELD | Admitting: Internal Medicine

## 2016-06-22 VITALS — BP 108/64 | HR 58 | Temp 98.3°F | Resp 14 | Ht 59.0 in | Wt 134.0 lb

## 2016-06-22 DIAGNOSIS — Z09 Encounter for follow-up examination after completed treatment for conditions other than malignant neoplasm: Secondary | ICD-10-CM | POA: Insufficient documentation

## 2016-06-22 DIAGNOSIS — Z114 Encounter for screening for human immunodeficiency virus [HIV]: Secondary | ICD-10-CM

## 2016-06-22 DIAGNOSIS — Z23 Encounter for immunization: Secondary | ICD-10-CM

## 2016-06-22 DIAGNOSIS — Z0000 Encounter for general adult medical examination without abnormal findings: Secondary | ICD-10-CM

## 2016-06-22 LAB — CBC WITH DIFFERENTIAL/PLATELET
BASOS PCT: 0.3 % (ref 0.0–3.0)
Basophils Absolute: 0 10*3/uL (ref 0.0–0.1)
EOS ABS: 0.1 10*3/uL (ref 0.0–0.7)
EOS PCT: 1.3 % (ref 0.0–5.0)
HEMATOCRIT: 40.9 % (ref 36.0–46.0)
Hemoglobin: 13.9 g/dL (ref 12.0–15.0)
LYMPHS PCT: 30.8 % (ref 12.0–46.0)
Lymphs Abs: 1.8 10*3/uL (ref 0.7–4.0)
MCHC: 33.9 g/dL (ref 30.0–36.0)
MCV: 86.9 fl (ref 78.0–100.0)
Monocytes Absolute: 0.3 10*3/uL (ref 0.1–1.0)
Monocytes Relative: 5.6 % (ref 3.0–12.0)
NEUTROS ABS: 3.7 10*3/uL (ref 1.4–7.7)
Neutrophils Relative %: 62 % (ref 43.0–77.0)
PLATELETS: 213 10*3/uL (ref 150.0–400.0)
RBC: 4.71 Mil/uL (ref 3.87–5.11)
RDW: 13.9 % (ref 11.5–15.5)
WBC: 5.9 10*3/uL (ref 4.0–10.5)

## 2016-06-22 LAB — LIPID PANEL
CHOLESTEROL: 218 mg/dL — AB (ref 0–200)
HDL: 48.1 mg/dL (ref >39.00)
LDL CALC: 155 mg/dL — AB (ref 0–99)
NonHDL: 169.98
TRIGLYCERIDES: 76 mg/dL (ref 0.0–149.0)
Total CHOL/HDL Ratio: 5
VLDL: 15.2 mg/dL (ref 0.0–40.0)

## 2016-06-22 LAB — BASIC METABOLIC PANEL
BUN: 9 mg/dL (ref 6–23)
CHLORIDE: 103 meq/L (ref 96–112)
CO2: 30 mEq/L (ref 19–32)
CREATININE: 0.57 mg/dL (ref 0.40–1.20)
Calcium: 9.3 mg/dL (ref 8.4–10.5)
GFR: 123.75 mL/min (ref >60.00)
Glucose, Bld: 94 mg/dL (ref 70–99)
POTASSIUM: 4 meq/L (ref 3.5–5.1)
Sodium: 139 mEq/L (ref 135–145)

## 2016-06-22 NOTE — Progress Notes (Signed)
Pre visit review using our clinic review tool, if applicable. No additional management support is needed unless otherwise documented below in the visit note. 

## 2016-06-22 NOTE — Assessment & Plan Note (Addendum)
Td 2015, flu shot today  Never had a cscope  Sees gyn for female care , Dr Pennie RushingHaygood   Diet-exercise-- encouraged to stay active and eat healthy. Labs: BMP, FLP, CBC, HIV

## 2016-06-22 NOTE — Patient Instructions (Signed)
GO TO THE LAB : Get the blood work     GO TO THE FRONT DESK Schedule your next appointment for a  Physical exam in 1 year 

## 2016-06-22 NOTE — Assessment & Plan Note (Signed)
Here for a physical exam, doing well, daughter DX with is strep throat, patient asx, will call if symptoms. RTC one year

## 2016-06-22 NOTE — Progress Notes (Signed)
Subjective:    Patient ID: Sierra Hodge, female    DOB: 1975/12/05, 40 y.o.   MRN: [id number]  DOS:  06/22/2016 Type of visit - description : cpx Interval history: No major concerns. Daughter was recently DX with strep throat.   Review of Systems Constitutional: No fever. No chills. No unexplained wt changes. No unusual sweats  HEENT: No dental problems, no ear discharge, no facial swelling, no voice changes. No eye discharge, no eye  redness , no  intolerance to light   Respiratory: No wheezing , no  difficulty breathing. No cough , no mucus production  Cardiovascular: No CP, no leg swelling , no  Palpitations  GI: no nausea, no vomiting, no diarrhea , no  abdominal pain.  No blood in the stools. No dysphagia, no odynophagia    Endocrine: No polyphagia, no polyuria , no polydipsia  GU: No dysuria, gross hematuria, difficulty urinating. No urinary urgency, no frequency.  Musculoskeletal: No joint swellings or unusual aches or pains  Skin: No change in the color of the skin, palor , no  Rash  Allergic, immunologic: +environmental allergies, Some nasal congestion, not taking any medication , no  food allergies  Neurological: No dizziness no  syncope. No headaches. No diplopia, no slurred, no slurred speech, no motor deficits, no facial  Numbness  Hematological: No enlarged lymph nodes, no easy bruising , no unusual bleedings  Psychiatry: No suicidal ideas, no hallucinations, no beavior problems, no confusion.  No unusual/severe anxiety, no depression   Past Medical History:  Diagnosis Date  . Chronic interstitial cystitis   . Endometriosis   . Hyperlipidemia   . IUD (intrauterine device) in place    Mirena, Dr. Pennie Rushing  . Other psoriasis     Past Surgical History:  Procedure Laterality Date  . CESAREAN SECTION     2008, 2010  . CYSTO WITH HYDRODISTENSION  03/29/2012   Procedure: CYSTOSCOPY/HYDRODISTENSION;  Surgeon: Martina Sinner, MD;  Location: [facility];   Service: Urology;  Laterality: N/A;  with Instillation of Marcaine and Pyridium   . HERNIA REPAIR  10/2010  . LAPAROSCOPY  03/29/2012   Procedure: LAPAROSCOPY OPERATIVE;  Surgeon: Hal Morales, MD;  Location: [facility];  Service: Gynecology;  Laterality: N/A;  with Fulguration of Endometriosis and Peritoneal Biopsy  . TONSILLECTOMY    . TUBAL LIGATION  2010    Family History  Problem Relation Age of Onset  . Hyperlipidemia Mother   . Heart disease Other     paternal family  . Diabetes Paternal Uncle   . Hyperlipidemia Sister   . Colon cancer Neg Hx   . Breast cancer Neg Hx     Social History   Social History  . Marital status: Married    Spouse name: N/A  . Number of children: 2  . Years of education: N/A   Occupational History  . 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 Smoking status: Never Smoker  . Smokeless tobacco: Never Used  . Alcohol use 0.6 oz/week    1 Glasses of wine per week     Comment: rarely   . Drug use: No  . Sexual activity: Yes    Partners: Male    Birth control/ protection: Surgical     Comment: BTL    Other Topics Concern  . Not on file   Social History Narrative   From [place] ; 2 daughters 2008, 2010  Medication List       Accurate as of 06/22/16  5:01 PM. Always use your most recent med list.          clobetasol ointment 0.05 % Commonly known as:  TEMOVATE Apply topically daily as needed.   fluticasone 50 MCG/ACT nasal spray Commonly known as:  FLONASE Place 2 sprays into both nostrils daily.   levonorgestrel 20 MCG/24HR IUD Commonly known as:  MIRENA 1 each by Intrauterine route once.   pentosan polysulfate 100 MG capsule Commonly known as:  ELMIRON Take 100 mg by mouth 2 (two) times daily. rx per gyn   SUMAtriptan 50 MG tablet Commonly known as:  IMITREX 1 tab po at onset of severe migraine type ha. Can repeat 1  tab in 2 hours if ha persists. Max is 2 tabs in any 24 hour  period          Objective:   Physical Exam BP 108/64 (BP Location: Right Arm, Patient Position: Sitting, Cuff Size: Small)   Pulse (!) 58   Temp 98.3 F (36.8 C) (Oral)   Resp 14   Ht 4\' 11"  (1.499 m)   Wt 134 lb (60.8 kg)   SpO2 99%   BMI 27.06 kg/m   General:   Well developed, well nourished . NAD.  Neck: No  thyromegaly  HEENT:  Normocephalic . Face symmetric, atraumatic Lungs:  CTA B Normal respiratory effort, no intercostal retractions, no accessory muscle use. Heart: RRR,  no murmur.  No pretibial edema bilaterally  Abdomen:  Not distended, soft, non-tender. No rebound or rigidity.   Skin: Exposed areas without rash. Not pale. Not jaundice Neurologic:  alert & oriented X3.  Speech normal, gait appropriate for age and unassisted Strength symmetric and appropriate for age.  Psych: Cognition and judgment appear intact.  Cooperative with normal attention span and concentration.  Behavior appropriate. No anxious or depressed appearing.    Assessment & Plan:   Assessment Hyperlipidemia Chronic Interstitial Cystitis (on elmiron, rx per gyn, no recent urology visit as of 05-2016) H/o psoriasis - seen Dr Lendon ColonelHawks before  Endometriosis -- has a Mirena  PLAN Here for a physical exam, doing well, daughter DX with is strep throat, patient asx, will call if symptoms. RTC one year

## 2016-06-23 ENCOUNTER — Telehealth: Payer: Self-pay | Admitting: Internal Medicine

## 2016-06-23 LAB — HIV ANTIBODY (ROUTINE TESTING W REFLEX): HIV 1&2 Ab, 4th Generation: NONREACTIVE

## 2016-06-23 NOTE — Telephone Encounter (Signed)
Please advise. Pt's daughter dx w/ strep throat recently.

## 2016-06-23 NOTE — Telephone Encounter (Signed)
LMOM informing Pt of recommendations. Instructed to call if not better in several days or if getting worse.

## 2016-06-23 NOTE — Telephone Encounter (Signed)
Recommend conservative treatment with Mucinex, Claritin, Flonase OTC. Also fluids and Tylenol. If she is not improving in the next few days or has severe symptoms needs to be checked.

## 2016-06-23 NOTE — Telephone Encounter (Signed)
Relation to YQ:MVHQpt:self Call back [phone] Pharmacy: [id number]- [place], [place] - [phone] (Phone) [phone] (Fax)     Reason for call:  Patient was seen for physical 06/22/16 and received her flu shot. Patient is now experiencing sore throat, stuffy nose and fatigue patient requesting antibiotic. Patient in need of clinical advice

## 2016-07-08 ENCOUNTER — Telehealth: Payer: Self-pay | Admitting: Internal Medicine

## 2016-07-08 NOTE — Telephone Encounter (Signed)
Pt will need OV w/ Dr. Drue NovelPaz to determine vaccinations needed.

## 2016-07-08 NOTE — Telephone Encounter (Signed)
Caller name: Relationship to patient: Self Can be reached: [phone] Pharmacy:  Reason for call: Patient called stating that she needs a TB test and some vaccines for her new employer. Wants to schedule a Nurse visit and have the nurse determine which vaccines she need. Plse adv

## 2016-07-08 NOTE — Telephone Encounter (Signed)
Appointment scheduled.

## 2016-07-13 ENCOUNTER — Encounter: Payer: Self-pay | Admitting: Internal Medicine

## 2016-07-13 ENCOUNTER — Ambulatory Visit (INDEPENDENT_AMBULATORY_CARE_PROVIDER_SITE_OTHER): Payer: BLUE CROSS/BLUE SHIELD | Admitting: Internal Medicine

## 2016-07-13 VITALS — BP 112/68 | HR 62 | Temp 97.8°F | Resp 14 | Ht 59.0 in | Wt 135.5 lb

## 2016-07-13 DIAGNOSIS — Z0000 Encounter for general adult medical examination without abnormal findings: Secondary | ICD-10-CM | POA: Diagnosis not present

## 2016-07-13 DIAGNOSIS — L409 Psoriasis, unspecified: Secondary | ICD-10-CM

## 2016-07-13 DIAGNOSIS — J309 Allergic rhinitis, unspecified: Secondary | ICD-10-CM | POA: Diagnosis not present

## 2016-07-13 MED ORDER — CLOBETASOL PROPIONATE 0.05 % EX OINT: TOPICAL_OINTMENT | Freq: Every day | CUTANEOUS | 5 refills | Status: DC | PRN

## 2016-07-13 MED ORDER — FLUTICASONE PROPIONATE 50 MCG/ACT NA SUSP: 2.0000 | Freq: Every day | NASAL | 5 refills | Status: DC

## 2016-07-13 NOTE — Assessment & Plan Note (Signed)
Allergic rhinitis: Refill Flonase Psoriasis: RF topical steroids Patient needs a number of tests for her new employment, we agreed to check a chickenpox serology, TB test and 12 drug panel scrren She already had a flu shot

## 2016-07-13 NOTE — Progress Notes (Signed)
Pre visit review using our clinic review tool, if applicable. No additional management support is needed unless otherwise documented below in the visit note. 

## 2016-07-13 NOTE — Patient Instructions (Signed)
Please go to the lab

## 2016-07-13 NOTE — Progress Notes (Signed)
Subjective:    Patient ID: Sierra Hodge, female    DOB: 12/06/1975, 40 y.o.   MRN: [id number]  DOS:  07/13/2016 Type of visit - description : here for screenings  Interval history: No other concerns    Review of Systems   Past Medical History:  Diagnosis Date  . Chronic interstitial cystitis   . Endometriosis   . Hyperlipidemia   . IUD (intrauterine device) in place    Mirena, Dr. Pennie Rushing  . Other psoriasis     Past Surgical History:  Procedure Laterality Date  . CESAREAN SECTION     2008, 2010  . CYSTO WITH HYDRODISTENSION  03/29/2012   Procedure: CYSTOSCOPY/HYDRODISTENSION;  Surgeon: Martina Sinner, MD;  Location: [facility];  Service: Urology;  Laterality: N/A;  with Instillation of Marcaine and Pyridium   . HERNIA REPAIR  10/2010  . LAPAROSCOPY  03/29/2012   Procedure: LAPAROSCOPY OPERATIVE;  Surgeon: Hal Morales, MD;  Location: [facility];  Service: Gynecology;  Laterality: N/A;  with Fulguration of Endometriosis and Peritoneal Biopsy  . TONSILLECTOMY    . TUBAL LIGATION  2010    Social History   Social History  . Marital status: Married    Spouse name: N/A  . Number of children: 2  . Years of education: N/A   Occupational History  . 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 Smoking status: Never Smoker  . Smokeless tobacco: Never Used  . Alcohol use 0.6 oz/week    1 Glasses of wine per week     Comment: rarely   . Drug use: No  . Sexual activity: Yes    Partners: Male    Birth control/ protection: Surgical     Comment: BTL    Other Topics Concern  . Not on file   Social History Narrative   From [place] ; 2 daughters 2008, 2010        Medication List       Accurate as of 07/13/16 10:47 AM. Always use your most recent med list.          clobetasol ointment 0.05 % Commonly known as:  TEMOVATE Apply topically daily as needed.   fluticasone 50 MCG/ACT nasal spray Commonly known as:   FLONASE Place 2 sprays into both nostrils daily.   levonorgestrel 20 MCG/24HR IUD Commonly known as:  MIRENA 1 each by Intrauterine route once.   pentosan polysulfate 100 MG capsule Commonly known as:  ELMIRON Take 100 mg by mouth 2 (two) times daily. rx per gyn   SUMAtriptan 50 MG tablet Commonly known as:  IMITREX 1 tab po at onset of severe migraine type ha. Can repeat 1  tab in 2 hours if ha persists. Max is 2 tabs in any 24 hour period          Objective:   Physical Exam BP 112/68 (BP Location: Left Arm, Patient Position: Sitting, Cuff Size: Small)   Pulse 62   Temp 97.8 F (36.6 C) (Oral)   Resp 14   Ht  (1.499 m)   Wt 135 lb 8 oz (61.5 kg)   SpO2 98%   BMI 27.37 kg/m  General:   Well developed, well nourished . NAD.  HEENT:  Normocephalic . Face symmetric, atraumatic   Skin: Not pale. Not jaundice Neurologic:  alert & oriented X3.  Speech normal, gait appropriate for age and unassisted Psych--  Cognition and judgment appear intact.  Cooperative  with normal attention span and concentration.  Behavior appropriate. No anxious or depressed appearing.      Assessment & Plan:   Assessment Hyperlipidemia Chronic Interstitial Cystitis (on elmiron, rx per gyn, no recent urology visit as of 05-2016) H/o psoriasis - seen Dr Lendon Colonel before  Endometriosis -- has a Mirena  PLAN Allergic rhinitis: Refill Flonase Psoriasis: RF topical steroids Patient needs a number of tests for her new employment, we agreed to check a chickenpox serology, TB test and 12 drug panel scrren She already had a flu shot

## 2016-07-14 LAB — VARICELLA ZOSTER ANTIBODY, IGG: Varicella IgG: 442.3 Index — ABNORMAL HIGH (ref <135.00)

## 2016-07-15 LAB — QUANTIFERON TB GOLD ASSAY (BLOOD)
INTERFERON GAMMA RELEASE ASSAY: NEGATIVE
QUANTIFERON NIL VALUE: 0.04 [IU]/mL
Quantiferon Tb Ag Minus Nil Value: 0 IU/mL

## 2016-09-06 ENCOUNTER — Ambulatory Visit (INDEPENDENT_AMBULATORY_CARE_PROVIDER_SITE_OTHER): Payer: BLUE CROSS/BLUE SHIELD | Admitting: Internal Medicine

## 2016-09-06 ENCOUNTER — Encounter: Payer: Self-pay | Admitting: Internal Medicine

## 2016-09-06 VITALS — BP 118/68 | HR 71 | Temp 98.6°F | Resp 14 | Ht 59.0 in | Wt 134.1 lb

## 2016-09-06 DIAGNOSIS — J069 Acute upper respiratory infection, unspecified: Secondary | ICD-10-CM

## 2016-09-06 MED ORDER — AMOXICILLIN 500 MG PO CAPS: 1000.0000 mg | ORAL_CAPSULE | Freq: Two times a day (BID) | ORAL | 0 refills | Status: DC

## 2016-09-06 MED ORDER — AZELASTINE HCL 0.1 % NA SOLN: 2.0000 | Freq: Every evening | NASAL | 3 refills | Status: DC | PRN

## 2016-09-06 MED ORDER — BENZONATATE 200 MG PO CAPS: 200.0000 mg | ORAL_CAPSULE | Freq: Three times a day (TID) | ORAL | 0 refills | Status: DC | PRN

## 2016-09-06 NOTE — Progress Notes (Signed)
Subjective:    Patient ID: Sierra Hodge, female    DOB: [birth year], 40 y.o.   MRN: [id number]  DOS:  09/06/2016 Type of visit - description : Acute visit Interval history: Sx onset  about 3 weeks ago with malaise, nasal congestion, shortly after she developed sinus pain. Self prescribed an antibiotic (Cipro) 5 days. Symptoms improved  little but then they come back: Cough intense to a point that sometimes can't sleep. Feeling tired Still has sinus congestion but now has also chest congestion.   Review of Systems  No fever but she has been taking OTC ibuprofen No nausea, vomiting, diarrhea Little sputum or nasal discharge production, sometimes yellow in color.  Past Medical History:  Diagnosis Date  . Chronic interstitial cystitis   . Endometriosis   . Hyperlipidemia   . IUD (intrauterine device) in place    Mirena, Dr. Pennie RushingHaygood  . Other psoriasis     Past Surgical History:  Procedure Laterality Date  . CESAREAN SECTION     2008, 2010  . CYSTO WITH HYDRODISTENSION  03/29/2012   Procedure: CYSTOSCOPY/HYDRODISTENSION;  Surgeon: Martina SinnerScott A MacDiarmid, MD;  Location: [facility];  Service: Urology;  Laterality: N/A;  with Instillation of Marcaine and Pyridium   . HERNIA REPAIR  10/2010  . LAPAROSCOPY  03/29/2012   Procedure: LAPAROSCOPY OPERATIVE;  Surgeon: Hal MoralesVanessa P Haygood, MD;  Location: [facility];  Service: Gynecology;  Laterality: N/A;  with Fulguration of Endometriosis and Peritoneal Biopsy  . TONSILLECTOMY    . TUBAL LIGATION  2010    Social History   Social History  . Marital status: Married    Spouse name: N/A  . Number of children: 2  . Years of education: N/A   Occupational History  . 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 Smoking status: Never Smoker  . Smokeless tobacco: Never Used  . Alcohol use 0.6 oz/week    1 Glasses of wine per week     Comment: rarely   . Drug use: No  . Sexual activity: Yes    Partners: Male   Birth control/ protection: Surgical     Comment: BTL    Other Topics Concern  . Not on file   Social History Narrative   From [place] ; 2 daughters 2008, 2010      Allergies as of 09/06/2016      Reactions   Morphine And Related       Medication List       Accurate as of 09/06/16  9:08 PM. Always use your most recent med list.          amoxicillin 500 MG capsule Commonly known as:  AMOXIL Take 2 capsules (1,000 mg total) by mouth 2 (two) times daily.   azelastine 0.1 % nasal spray Commonly known as:  ASTELIN Place 2 sprays into both nostrils at bedtime as needed for rhinitis. Use in each nostril as directed   benzonatate 200 MG capsule Commonly known as:  TESSALON Take 1 capsule (200 mg total) by mouth 3 (three) times daily as needed for cough.   clobetasol ointment 0.05 % Commonly known as:  TEMOVATE Apply topically daily as needed.   fluticasone 50 MCG/ACT nasal spray Commonly known as:  FLONASE Place 2 sprays into both nostrils daily.   levonorgestrel 20 MCG/24HR IUD Commonly known as:  MIRENA 1 each by Intrauterine route once.   pentosan polysulfate 100 MG capsule Commonly known as:  ELMIRON  Take 100 mg by mouth 2 (two) times daily. rx per gyn          Objective:   Physical Exam BP 118/68 (BP Location: Left Arm, Patient Position: Sitting, Cuff Size: Small)   Pulse 71   Temp 98.6 F (37 C) (Oral)   Resp 14   Ht 4\' 11"  (1.499 m)   Wt 134 lb 2 oz (60.8 kg)   SpO2 98%   BMI 27.09 kg/m  General:   Well developed, well nourished . NAD.  HEENT:  Normocephalic . Face symmetric, atraumatic. TMs slightly bulge but no red. Throat symmetric, no red. Nose slightly congested, sinuses slightly TTP bilaterally Lungs:  CTA B Normal respiratory effort, no intercostal retractions, no accessory muscle use. Heart: RRR,  no murmur.  No pretibial edema bilaterally  Skin: Not pale. Not jaundice Neurologic:  alert & oriented X3.  Speech normal, gait  appropriate for age and unassisted Psych--  Cognition and judgment appear intact.  Cooperative with normal attention span and concentration.  Behavior appropriate. No anxious or depressed appearing.      Assessment & Plan:   Assessment Hyperlipidemia Chronic Interstitial Cystitis (on elmiron, rx per gyn, no recent urology visit as of 05-2016) H/o psoriasis - seen Dr Lendon ColonelHawks before  Endometriosis -- has a Mirena  PLAN URI: Likely viral, recommend conservative treatment with Mucinex DM, Tessalon, Flonase, rest, Tylenol. Will recommend amoxicillin only if not improving in the next few days.  States that she can't  tolerate Mucinex DM so we'll stick with Tessalon. Also request a prescription for Astelin ---> sent .

## 2016-09-06 NOTE — Patient Instructions (Addendum)
Rest, fluids , tylenol  For cough:  Take Mucinex DM twice a day as needed until better If cough continue, use TESSALON  For nasal congestion: Use OTC Nasocort or Flonase : 2 nasal sprays on each side of the nose in the morning until you feel better   Get pseudoephedrine 30 mg (behind the counter, you need to talk with the pharmacist) take one tablet 3 or 4 times a day as needed for congestion  Take the antibiotic as prescribed  (Amoxicillin) ONLY IF NO BETTER IN 4-5 DAYS   Call if not gradually better over the next  10 days  Call anytime if the symptoms are severe  NEXT VISIT 06-2017 FOR A PHYSICAL

## 2016-09-06 NOTE — Assessment & Plan Note (Signed)
URI: Likely viral, recommend conservative treatment with Mucinex DM, Tessalon, Flonase, rest, Tylenol. Will recommend amoxicillin only if not improving in the next few days.  States that she can't  tolerate Mucinex DM so we'll stick with Tessalon. Also request a prescription for Astelin ---> sent .

## 2016-09-06 NOTE — Progress Notes (Signed)
Pre visit review using our clinic review tool, if applicable. No additional management support is needed unless otherwise documented below in the visit note. 

## 2016-09-20 HISTORY — PX: ESOPHAGOGASTRODUODENOSCOPY: SHX1529

## 2016-09-24 ENCOUNTER — Encounter: Payer: Self-pay | Admitting: Internal Medicine

## 2016-09-24 ENCOUNTER — Ambulatory Visit (INDEPENDENT_AMBULATORY_CARE_PROVIDER_SITE_OTHER): Payer: BLUE CROSS/BLUE SHIELD | Admitting: Internal Medicine

## 2016-09-24 VITALS — BP 124/76 | HR 72 | Temp 98.2°F | Resp 14 | Ht 59.0 in | Wt 136.2 lb

## 2016-09-24 DIAGNOSIS — L603 Nail dystrophy: Secondary | ICD-10-CM | POA: Diagnosis not present

## 2016-09-24 DIAGNOSIS — R51 Headache: Secondary | ICD-10-CM | POA: Diagnosis not present

## 2016-09-24 DIAGNOSIS — R519 Headache, unspecified: Secondary | ICD-10-CM

## 2016-09-24 MED ORDER — AZITHROMYCIN 250 MG PO TABS: ORAL_TABLET | ORAL | 0 refills | Status: DC

## 2016-09-24 MED ORDER — PREDNISONE 10 MG PO TABS: ORAL_TABLET | ORAL | 0 refills | Status: DC

## 2016-09-24 NOTE — Progress Notes (Signed)
Subjective:    Patient ID: Sierra Hodge, female    DOB: 1976-08-14, 41 y.o.   MRN: [id number]  DOS:  09/24/2016 Type of visit - description : Acute visit Interval history: Was seen 09/06/2016 with URI, took amoxicillin, become essentially asymptomatic. She is here because a week ago developed a headache, on and off, gradual onset, sometimes intense but " not the worse of life". Is located at the top of the head, behind the eyes and nose. Reports that this is not the first time she has something like this, typically happens after a URI and she has considered these a "sinus headache". Ibuprofen helps. Symptoms worse with certain things like a strong odor-perfume, no photophobia.  Review of Systems Denies any fever chills No head injury Had mild nausea a few times this week but no vomiting.  No nasal discharge, no cough.  Past Medical History:  Diagnosis Date  . Chronic interstitial cystitis   . Endometriosis   . Hyperlipidemia   . IUD (intrauterine device) in place    Mirena, Dr. Pennie Rushing  . Other psoriasis     Past Surgical History:  Procedure Laterality Date  . CESAREAN SECTION     2008, 2010  . CYSTO WITH HYDRODISTENSION  03/29/2012   Procedure: CYSTOSCOPY/HYDRODISTENSION;  Surgeon: Martina Sinner, MD;  Location: [facility];  Service: Urology;  Laterality: N/A;  with Instillation of Marcaine and Pyridium   . HERNIA REPAIR  10/2010  . LAPAROSCOPY  03/29/2012   Procedure: LAPAROSCOPY OPERATIVE;  Surgeon: Hal Morales, MD;  Location: [facility];  Service: Gynecology;  Laterality: N/A;  with Fulguration of Endometriosis and Peritoneal Biopsy  . TONSILLECTOMY    . TUBAL LIGATION  2010    Social History   Social History  . Marital status: Married    Spouse name: N/A  . Number of children: 2  . Years of education: N/A   Occupational History  . 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 Smoking status: Never Smoker  . Smokeless  tobacco: Never Used  . Alcohol use 0.6 oz/week    1 Glasses of wine per week     Comment: rarely   . Drug use: No  . Sexual activity: Yes    Partners: Male    Birth control/ protection: Surgical     Comment: BTL    Other Topics Concern  . Not on file   Social History Narrative   From [place] ; 2 daughters 2008, 2010      Allergies as of 09/24/2016      Reactions   Morphine And Related       Medication List       Accurate as of 09/24/16 11:59 PM. Always use your most recent med list.          azelastine 0.1 % nasal spray Commonly known as:  ASTELIN Place 2 sprays into both nostrils at bedtime as needed for rhinitis. Use in each nostril as directed   azithromycin 250 MG tablet Commonly known as:  ZITHROMAX Z-PAK 2 tabs a day the first day, then 1 tab a day x 4 days   clobetasol ointment 0.05 % Commonly known as:  TEMOVATE Apply topically daily as needed.   fluticasone 50 MCG/ACT nasal spray Commonly known as:  FLONASE Place 2 sprays into both nostrils daily.   levonorgestrel 20 MCG/24HR IUD Commonly known as:  MIRENA 1 each by Intrauterine route once.   pentosan  polysulfate 100 MG capsule Commonly known as:  ELMIRON Take 100 mg by mouth 2 (two) times daily. rx per gyn   predniSONE 10 MG tablet Commonly known as:  DELTASONE 4 tablets x 2 days, 3 tabs x 2 days, 2 tabs x 2 days, 1 tab x 2 days          Objective:   Physical Exam BP 124/76 (BP Location: Left Arm, Patient Position: Sitting, Cuff Size: Normal)   Pulse 72   Temp 98.2 F (36.8 C) (Oral)   Resp 14   Ht 4\' 11"  (1.499 m)   Wt 136 lb 4 oz (61.8 kg)   SpO2 97%   BMI 27.52 kg/m  General:   Well developed, well nourished . NAD.  HEENT:  Normocephalic . Face symmetric, atraumatic. No facial swelling. TMs normal, nose moderately congested but no discharge noted.  Sinuses: TTP throughout the maxillary and frontal areas. EOMI, pupils equal and reactive. Lungs:  CTA B Normal respiratory  effort, no intercostal retractions, no accessory muscle use. Heart: RRR,  no murmur.  No pretibial edema bilaterally  Skin: Not pale. Not jaundice Neurologic:  alert & oriented X3.  Speech normal, gait appropriate for age and unassisted. Motor and DTRs symmetric Psych--  Cognition and judgment appear intact.  Cooperative with normal attention span and concentration.  Behavior appropriate. No anxious or depressed appearing.      Assessment & Plan:   Assessment Hyperlipidemia Chronic Interstitial Cystitis (on elmiron, rx per gyn, no recent urology visit as of 05-2016) H/o psoriasis - seen Dr Lendon ColonelHawks before  Endometriosis -- has a Mirena  PLAN Headache:  Presents w/ a headache for a few days in the context of recent URI, ongoing sinus congestion. Symptoms similar to previous episodes of URI/sinusitis. She took amoxicillin recently. Partially treated sinusitis?. Other considerations are migraines and much less likely a intracranial problem (sx not c/w a thunderclap headache) Plan: Second round of abx with Z-Pak, Rx prednisone, continue Astelin and Flonase. Asked patient to call me next week and let me know how she's doing, ER if not improving or if symptoms worsen. Nail abnormality-- greenish discoloration of the left thumb, refer to dermatology

## 2016-09-24 NOTE — Progress Notes (Signed)
Pre visit review using our clinic review tool, if applicable. No additional management support is needed unless otherwise documented below in the visit note. 

## 2016-09-24 NOTE — Patient Instructions (Signed)
Take Zithromax for 5 days  Take prednisone as prescribed  Continue with flonase every morning and Astelin every night  Take Tylenol or ibuprofen as needed  Call me in 3 days and let me know how you're doing  Go to the ER if you have severe symptoms, fever, chills, rash, stiff neck

## 2016-09-26 NOTE — Assessment & Plan Note (Signed)
Headache:  Presents w/ a headache for a few days in the context of recent URI, ongoing sinus congestion. Symptoms similar to previous episodes of URI/sinusitis. She took amoxicillin recently. Partially treated sinusitis?. Other considerations are migraines and much less likely a intracranial problem (sx not c/w a thunderclap headache) Plan: Second round of abx with Z-Pak, Rx prednisone, continue Astelin and Flonase. Asked patient to call me next week and let me know how she's doing, ER if not improving or if symptoms worsen. Nail abnormality-- greenish discoloration of the left thumb, refer to dermatology

## 2016-09-29 ENCOUNTER — Telehealth: Payer: Self-pay | Admitting: Internal Medicine

## 2016-09-29 NOTE — Telephone Encounter (Signed)
Patient called stating that she does not have the strong headaches anymore but she still has sinus pressure in her face and forehead. Please advise  Phone: [phone]

## 2016-09-29 NOTE — Telephone Encounter (Signed)
Pt seen on 09/24/2016, please advise.

## 2016-09-29 NOTE — Telephone Encounter (Signed)
Spoke w/ Sierra Hodge, informed her of PCP recommendations. Sierra Hodge verbalized understanding.

## 2016-09-29 NOTE — Telephone Encounter (Signed)
My impression is that she had sinusitis  so I think she will continue improving, if she is not completely back to normal in few days she needs to let me know. May need a CT or MRI.

## 2016-10-11 DIAGNOSIS — N803 Endometriosis of pelvic peritoneum: Secondary | ICD-10-CM | POA: Diagnosis not present

## 2016-10-11 DIAGNOSIS — R102 Pelvic and perineal pain: Secondary | ICD-10-CM | POA: Diagnosis not present

## 2016-10-11 DIAGNOSIS — N3010 Interstitial cystitis (chronic) without hematuria: Secondary | ICD-10-CM | POA: Diagnosis not present

## 2016-10-11 DIAGNOSIS — Z01419 Encounter for gynecological examination (general) (routine) without abnormal findings: Secondary | ICD-10-CM | POA: Diagnosis not present

## 2016-10-26 DIAGNOSIS — L720 Epidermal cyst: Secondary | ICD-10-CM | POA: Diagnosis not present

## 2016-10-26 DIAGNOSIS — B351 Tinea unguium: Secondary | ICD-10-CM | POA: Diagnosis not present

## 2016-10-26 DIAGNOSIS — R61 Generalized hyperhidrosis: Secondary | ICD-10-CM | POA: Diagnosis not present

## 2016-11-11 DIAGNOSIS — Z79899 Other long term (current) drug therapy: Secondary | ICD-10-CM | POA: Diagnosis not present

## 2016-11-11 DIAGNOSIS — B351 Tinea unguium: Secondary | ICD-10-CM | POA: Diagnosis not present

## 2016-11-15 DIAGNOSIS — N809 Endometriosis, unspecified: Secondary | ICD-10-CM | POA: Diagnosis not present

## 2016-11-15 DIAGNOSIS — R102 Pelvic and perineal pain: Secondary | ICD-10-CM | POA: Diagnosis not present

## 2016-11-15 DIAGNOSIS — N803 Endometriosis of pelvic peritoneum: Secondary | ICD-10-CM | POA: Diagnosis not present

## 2016-12-24 DIAGNOSIS — R102 Pelvic and perineal pain: Secondary | ICD-10-CM | POA: Diagnosis not present

## 2016-12-24 DIAGNOSIS — R319 Hematuria, unspecified: Secondary | ICD-10-CM | POA: Diagnosis not present

## 2016-12-24 DIAGNOSIS — N803 Endometriosis of pelvic peritoneum: Secondary | ICD-10-CM | POA: Diagnosis not present

## 2016-12-24 DIAGNOSIS — N3010 Interstitial cystitis (chronic) without hematuria: Secondary | ICD-10-CM | POA: Diagnosis not present

## 2016-12-27 DIAGNOSIS — R319 Hematuria, unspecified: Secondary | ICD-10-CM | POA: Diagnosis not present

## 2017-01-20 ENCOUNTER — Encounter: Payer: Self-pay | Admitting: Internal Medicine

## 2017-01-20 ENCOUNTER — Ambulatory Visit (INDEPENDENT_AMBULATORY_CARE_PROVIDER_SITE_OTHER): Payer: BLUE CROSS/BLUE SHIELD | Admitting: Internal Medicine

## 2017-01-20 VITALS — BP 110/76 | HR 71 | Temp 97.8°F | Resp 12 | Ht 59.0 in | Wt 133.2 lb

## 2017-01-20 DIAGNOSIS — J019 Acute sinusitis, unspecified: Secondary | ICD-10-CM

## 2017-01-20 MED ORDER — AMOXICILLIN 500 MG PO CAPS: 1000.0000 mg | ORAL_CAPSULE | Freq: Two times a day (BID) | ORAL | 0 refills | Status: DC

## 2017-01-20 NOTE — Progress Notes (Signed)
Pre visit review using our clinic review tool, if applicable. No additional management support is needed unless otherwise documented below in the visit note. 

## 2017-01-20 NOTE — Patient Instructions (Signed)
Rest, fluids , tylenol  If  cough:  Take Mucinex DM twice a day as needed until better  For nasal congestion: Use OTC Nasocort or Flonase : 2 nasal sprays on each side of the nose in the morning until you feel better Use ASTELIN a prescribed spray : 2 nasal sprays on each side of the nose at night until you feel better Claritin D for a few days only, avoid other medications that contain  decongestants such as  Pseudoephedrine or phenylephrine   Take the antibiotic as prescribed  (Amoxicillin) ONLY IF NO BETTER IN THE NEXT 3-4 DAYS   Call if not gradually better over the next  10 days  Call anytime if the symptoms are severe

## 2017-01-20 NOTE — Progress Notes (Signed)
Subjective:    Patient ID: Sierra Hodge, female    DOB: [birth year], 41 y.o.   MRN: [id number]  DOS:  01/20/2017 Type of visit - description : acute Interval history: Symptoms started 3 days ago with nasal congestion, facial pain, mild cough, sore throat. Daughter was diagnosed with a virus, her strep test was negative, she had the same symptoms.   Review of Systems  No actual fever chills No nausea or vomiting Some light intolerance but no global headache Past Medical History:  Diagnosis Date  . Chronic interstitial cystitis   . Endometriosis   . Hyperlipidemia   . IUD (intrauterine device) in place    Mirena, Dr. Pennie Rushing  . Other psoriasis     Past Surgical History:  Procedure Laterality Date  . CESAREAN SECTION     2008, 2010  . CYSTO WITH HYDRODISTENSION  03/29/2012   Procedure: CYSTOSCOPY/HYDRODISTENSION;  Surgeon: Martina Sinner, MD;  Location: [facility];  Service: Urology;  Laterality: N/A;  with Instillation of Marcaine and Pyridium   . HERNIA REPAIR  10/2010  . LAPAROSCOPY  03/29/2012   Procedure: LAPAROSCOPY OPERATIVE;  Surgeon: Hal Morales, MD;  Location: [facility];  Service: Gynecology;  Laterality: N/A;  with Fulguration of Endometriosis and Peritoneal Biopsy  . TONSILLECTOMY    . TUBAL LIGATION  2010    Social History   Social History  . Marital status: Married    Spouse name: N/A  . Number of children: 2  . Years of education: N/A   Occupational History  . 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 Smoking status: Never Smoker  . Smokeless tobacco: Never Used  . Alcohol use 0.6 oz/week    1 Glasses of wine per week     Comment: rarely   . Drug use: No  . Sexual activity: Yes    Partners: Male    Birth control/ protection: Surgical     Comment: BTL    Other Topics Concern  . Not on file   Social History Narrative   From [place] ; 2 daughters 2008, 2010      Allergies as of 01/20/2017    Reactions   Morphine And Related       Medication List       Accurate as of 01/20/17  2:26 PM. Always use your most recent med list.          amoxicillin 500 MG capsule Commonly known as:  AMOXIL Take 2 capsules (1,000 mg total) by mouth 2 (two) times daily.   azelastine 0.1 % nasal spray Commonly known as:  ASTELIN Place 2 sprays into both nostrils at bedtime as needed for rhinitis. Use in each nostril as directed   clobetasol ointment 0.05 % Commonly known as:  TEMOVATE Apply topically daily as needed.   fluticasone 50 MCG/ACT nasal spray Commonly known as:  FLONASE Place 2 sprays into both nostrils daily.   levonorgestrel 20 MCG/24HR IUD Commonly known as:  MIRENA 1 each by Intrauterine route once.   pentosan polysulfate 100 MG capsule Commonly known as:  ELMIRON Take 100 mg by mouth 2 (two) times daily. rx per gyn          Objective:   Physical Exam BP 110/76 (BP Location: Left Arm, Patient Position: Sitting, Cuff Size: Small)   Pulse 71   Temp 97.8 F (36.6 C) (Oral)   Resp 12   Ht 4\' 11"  (1.499 m)  Wt 133 lb 4 oz (60.4 kg)   SpO2 96%   BMI 26.91 kg/m  General:   Well developed, well nourished . NAD.  HEENT:  Normocephalic . Face symmetric, atraumatic. TMs slightly bulge but not red. Nose quite congested, sinuses: TTP throughout the frontal and maxillary sinuses. Throat: Symmetric, not red. Lungs:  CTA B Normal respiratory effort, no intercostal retractions, no accessory muscle use. Heart: RRR,  no murmur.  No pretibial edema bilaterally  Skin: Not pale. Not jaundice Neurologic:  alert & oriented X3.  Speech normal, gait appropriate for age and unassisted Psych--  Cognition and judgment appear intact.  Cooperative with normal attention span and concentration.  Behavior appropriate. No anxious or depressed appearing.      Assessment & Plan:   Assessment Hyperlipidemia Chronic Interstitial Cystitis (on elmiron, rx per gyn, no recent  urology visit as of 05-2016) H/o psoriasis - seen Dr Lendon ColonelHawks before  Endometriosis -- has a Mirena  PLAN Acute sinusitis: Likely viral, supportive treatment, see instructions. If no better start amoxicillin. Nail abnormality: See last visit,  saw dermatology, DX onychomycosis.

## 2017-01-20 NOTE — Assessment & Plan Note (Signed)
Acute sinusitis: Likely viral, supportive treatment, see instructions. If no better start amoxicillin. Nail abnormality: See last visit,  saw dermatology, DX onychomycosis.

## 2017-02-24 DIAGNOSIS — Z1231 Encounter for screening mammogram for malignant neoplasm of breast: Secondary | ICD-10-CM | POA: Diagnosis not present

## 2017-02-24 LAB — HM MAMMOGRAPHY

## 2017-04-18 ENCOUNTER — Encounter: Payer: Self-pay | Admitting: Internal Medicine

## 2017-04-18 ENCOUNTER — Ambulatory Visit (INDEPENDENT_AMBULATORY_CARE_PROVIDER_SITE_OTHER): Payer: BLUE CROSS/BLUE SHIELD | Admitting: Internal Medicine

## 2017-04-18 VITALS — BP 118/79 | HR 76 | Temp 98.1°F | Resp 14 | Ht 59.0 in | Wt 137.5 lb

## 2017-04-18 DIAGNOSIS — R5383 Other fatigue: Secondary | ICD-10-CM

## 2017-04-18 DIAGNOSIS — R202 Paresthesia of skin: Secondary | ICD-10-CM | POA: Diagnosis not present

## 2017-04-18 NOTE — Patient Instructions (Addendum)
GO TO THE LAB : Get the blood work     GO TO THE FRONT DESK Schedule your next appointment for a  physical exam in 4-6 months. Fasting.

## 2017-04-18 NOTE — Assessment & Plan Note (Signed)
Fatigue, paresthesias: ROS and physical exam benign. Etiology unclear. Recommend rest, good nutrition, drink plenty of fluids. We'll check a TSH, BMP, CBC, B12, folic acid, vitamin D, sedimentation rate. Advised to call if not gradually better or if symptoms severe. CPX 4-6 months at her convenience

## 2017-04-18 NOTE — Progress Notes (Signed)
Subjective:    Patient ID: Sierra Hodge, female    DOB: 1975/12/07, 41 y.o.   MRN: [id number]  DOS:  04/18/2017 Type of visit - description : Acute Interval history: She came back from [place] 2 weeks ago, she went on vacation and felt great while she was there. Reports a one-week history of generalized weakness, legs feel heavy " like after you take a long walk". She is also having a tingling feeling at both shoulders. Hands are numb in the morning, feet get numb if she sits for prolonged period of time. She thought it could be low potassium, started a high potassium diet and the leg heaviness has improved.   Review of Systems Denies fever chills, no recent URI or viral syndrome. Calf: No swelling No chest pain, difficulty breathing, lower extremity edema. No DOE No neck pain, myalgias per se or any rash. stress is okay, denies anxiety. No headaches.   Past Medical History:  Diagnosis Date  . Chronic interstitial cystitis   . Endometriosis   . Hyperlipidemia   . IUD (intrauterine device) in place    Mirena, Dr. Pennie RushingHaygood  . Other psoriasis     Past Surgical History:  Procedure Laterality Date  . CESAREAN SECTION     2008, 2010  . CYSTO WITH HYDRODISTENSION  03/29/2012   Procedure: CYSTOSCOPY/HYDRODISTENSION;  Surgeon: Martina SinnerScott A MacDiarmid, MD;  Location: [facility];  Service: Urology;  Laterality: N/A;  with Instillation of Marcaine and Pyridium   . HERNIA REPAIR  10/2010  . LAPAROSCOPY  03/29/2012   Procedure: LAPAROSCOPY OPERATIVE;  Surgeon: Hal MoralesVanessa P Haygood, MD;  Location: [facility];  Service: Gynecology;  Laterality: N/A;  with Fulguration of Endometriosis and Peritoneal Biopsy  . TONSILLECTOMY    . TUBAL LIGATION  2010    Social History   Social History  . Marital status: Married    Spouse name: N/A  . Number of children: 2  . Years of education: N/A   Occupational History  . 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  Smoking status: Never Smoker  . Smokeless tobacco: Never Used  . Alcohol use 0.6 oz/week    1 Glasses of wine per week     Comment: rarely   . Drug use: No  . Sexual activity: Yes    Partners: Male    Birth control/ protection: Surgical     Comment: BTL    Other Topics Concern  . Not on file   Social History Narrative   From [place] ; 2 daughters 2008, 2010      Allergies as of 04/18/2017      Reactions   Morphine And Related    Meloxicam Other (See Comments)      Medication List       Accurate as of 04/18/17  9:56 PM. Always use your most recent med list.          cetirizine 10 MG tablet Commonly known as:  ZYRTEC Take 10 mg by mouth daily.   clobetasol ointment 0.05 % Commonly known as:  TEMOVATE Apply topically daily as needed.   fluticasone 50 MCG/ACT nasal spray Commonly known as:  FLONASE Place 2 sprays into both nostrils daily.   levonorgestrel 20 MCG/24HR IUD Commonly known as:  MIRENA 1 each by Intrauterine route once.   pentosan polysulfate 100 MG capsule Commonly known as:  ELMIRON Take 100 mg by mouth 2 (two) times daily. rx per gyn  Objective:   Physical Exam BP 118/79 (BP Location: Left Arm, Patient Position: Sitting, Cuff Size: Small)   Pulse 76   Temp 98.1 F (36.7 C) (Oral)   Resp 14   Ht 4\' 11"  (1.499 m)   Wt 137 lb 8 oz (62.4 kg)   SpO2 97%   BMI 27.77 kg/m  General:   Well developed, well nourished . NAD.  HEENT:  Normocephalic . Face symmetric, atraumatic. Neck: No thyromegaly Lungs:  CTA B Normal respiratory effort, no intercostal retractions, no accessory muscle use. Heart: RRR,  no murmur.  no pretibial edema bilaterally  Lower extremities: Good pedal pulses. Abdomen:  Not distended, soft, non-tender. No rebound or rigidity.  Skin: Not pale. Not jaundice Neurologic:  alert & oriented X3.  Speech normal, gait appropriate for age and unassisted. DTRs symmetric Psych--  Cognition and judgment appear  intact.  Cooperative with normal attention span and concentration.  Behavior appropriate. No anxious or depressed appearing.    Assessment & Plan:   Assessment Hyperlipidemia Chronic Interstitial Cystitis (on elmiron, rx per gyn, no recent urology visit as of 05-2016) H/o psoriasis - seen Dr Lendon ColonelHawks before  Endometriosis -- has a Mirena  PLAN Fatigue, paresthesias: ROS and physical exam benign. Etiology unclear. Recommend rest, good nutrition, drink plenty of fluids. We'll check a TSH, BMP, CBC, B12, folic acid, vitamin D, sedimentation rate. Advised to call if not gradually better or if symptoms severe. CPX 4-6 months at her convenience

## 2017-04-18 NOTE — Progress Notes (Signed)
Pre visit review using our clinic review tool, if applicable. No additional management support is needed unless otherwise documented below in the visit note. 

## 2017-04-19 LAB — BASIC METABOLIC PANEL
BUN: 9 mg/dL (ref 6–23)
CALCIUM: 9.1 mg/dL (ref 8.4–10.5)
CO2: 25 mEq/L (ref 19–32)
Chloride: 105 mEq/L (ref 96–112)
Creatinine, Ser: 0.6 mg/dL (ref 0.40–1.20)
GFR: 116.17 mL/min (ref >60.00)
GLUCOSE: 88 mg/dL (ref 70–99)
POTASSIUM: 3.9 meq/L (ref 3.5–5.1)
SODIUM: 139 meq/L (ref 135–145)

## 2017-04-19 LAB — CBC WITH DIFFERENTIAL/PLATELET
BASOS ABS: 0 10*3/uL (ref 0.0–0.1)
Basophils Relative: 0.5 % (ref 0.0–3.0)
EOS PCT: 1.4 % (ref 0.0–5.0)
Eosinophils Absolute: 0.1 10*3/uL (ref 0.0–0.7)
HCT: 41 % (ref 36.0–46.0)
HEMOGLOBIN: 13.6 g/dL (ref 12.0–15.0)
LYMPHS ABS: 2.1 10*3/uL (ref 0.7–4.0)
Lymphocytes Relative: 26.4 % (ref 12.0–46.0)
MCHC: 33.1 g/dL (ref 30.0–36.0)
MCV: 89.5 fl (ref 78.0–100.0)
MONO ABS: 0.6 10*3/uL (ref 0.1–1.0)
MONOS PCT: 7.3 % (ref 3.0–12.0)
NEUTROS PCT: 64.4 % (ref 43.0–77.0)
Neutro Abs: 5.2 10*3/uL (ref 1.4–7.7)
Platelets: 230 10*3/uL (ref 150.0–400.0)
RBC: 4.58 Mil/uL (ref 3.87–5.11)
RDW: 13.8 % (ref 11.5–15.5)
WBC: 8 10*3/uL (ref 4.0–10.5)

## 2017-04-19 LAB — TSH: TSH: 0.97 u[IU]/mL (ref 0.35–4.50)

## 2017-04-19 LAB — SEDIMENTATION RATE: SED RATE: 2 mm/h (ref 0–20)

## 2017-04-19 LAB — VITAMIN B12: Vitamin B-12: 304 pg/mL (ref 211–911)

## 2017-04-19 LAB — FOLATE: FOLATE: 19.1 ng/mL (ref >5.9)

## 2017-04-23 LAB — VITAMIN D 1,25 DIHYDROXY
VITAMIN D3 1, 25 (OH): 45 pg/mL
Vitamin D 1, 25 (OH)2 Total: 45 pg/mL (ref 18–72)
Vitamin D2 1, 25 (OH)2: <8 pg/mL

## 2017-05-19 ENCOUNTER — Encounter: Payer: Self-pay | Admitting: Internal Medicine

## 2017-05-27 ENCOUNTER — Ambulatory Visit: Payer: BLUE CROSS/BLUE SHIELD | Admitting: Internal Medicine

## 2017-05-30 ENCOUNTER — Encounter (INDEPENDENT_AMBULATORY_CARE_PROVIDER_SITE_OTHER): Payer: Self-pay

## 2017-05-30 ENCOUNTER — Encounter: Payer: Self-pay | Admitting: Internal Medicine

## 2017-05-30 ENCOUNTER — Ambulatory Visit (INDEPENDENT_AMBULATORY_CARE_PROVIDER_SITE_OTHER): Payer: BLUE CROSS/BLUE SHIELD | Admitting: Internal Medicine

## 2017-05-30 VITALS — BP 90/50 | HR 78 | Ht 59.0 in | Wt 139.0 lb

## 2017-05-30 DIAGNOSIS — R1010 Upper abdominal pain, unspecified: Secondary | ICD-10-CM | POA: Diagnosis not present

## 2017-05-30 DIAGNOSIS — Z8742 Personal history of other diseases of the female genital tract: Secondary | ICD-10-CM | POA: Diagnosis not present

## 2017-05-30 DIAGNOSIS — R140 Abdominal distension (gaseous): Secondary | ICD-10-CM

## 2017-05-30 NOTE — Patient Instructions (Addendum)
You have been scheduled for an abdominal ultrasound at [facility] (1st floor of hospital) on 06/03/17 at 10:30 am. Please arrive 15 minutes prior to your appointment for registration. Make certain not to have anything to eat or drink 6 hours prior to your appointment. Should you need to reschedule your appointment, please contact radiology at [phone]. This test typically takes about 30 minutes to perform.  You have been scheduled for an endoscopy. Please follow written instructions given to you at your visit today. If you use inhalers (even only as needed), please bring them with you on the day of your procedure. Your physician has requested that you go to www.startemmi.com and enter the access code given to you at your visit today. This web site gives a general overview about your procedure. However, you should still follow specific instructions given to you by our office regarding your preparation for the procedure.   I appreciate the opportunity to care for you. Stan Headarl Gessner, MD, [facility]

## 2017-05-30 NOTE — Progress Notes (Signed)
Sierra ChyleMarlen M Hodge 41 y.o. March 07, 1976 [id number] Self-referred Assessment & Plan:   Encounter Diagnoses  Name Primary?  Marland Kitchen. Upper abdominal pain Yes  . Abdominal distention    I think the main differential here is whether there is something upper GI with gastric or duodenal ulcer disease etc. versus gallstones. I'm going to work her up with an EGD and an ultrasound. Further plans pending that. Note that she has interstitial cystitis, there is a higher incidence of functional GI disturbance i.e. disorders of the muscles and nerves of the guts which can cause the symptoms as well, but she is also at higher risk of adhesions that could be causing some problems. However the onset of symptoms within 20 minutes or so eating suggests not in my mind i.e. doubt adhesions as a cause. Endometriosis is a possibility but is unlikely she says that is more typically associated with her cycle, and these are postprandial problems.  The risks and benefits as well as alternatives of endoscopic procedure(s) have been discussed and reviewed. All questions answered. The patient agrees to proceed.  I appreciate the opportunity to care for this patient. CC: Wanda PlumpPaz, Jose E, MD Dr. 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 These occur 20 minutes or so after eating. She starts to feel full and bloated and the pain intensifies and can go into the back, with an urge to belch. She does not vomit. Bowel habits are a little bit irregular which is new but no major change. She has a history of endometriosis, and has had 2 C-sections and then had an umbilical hernia repair in the past. The umbilical area remains sore to the touch at times but she  does not think she's had a recurrent hernia. Because she's had some episodes of these symptoms are an evaluation by gynecology she says she had an ultrasound exam and a pelvic exam etc. and these were unrevealing though her gynecologist did suggest the possibility of performing a laparoscopy. The patient decided to have a GI evaluation. She does not have any dysphagia. She has a history of taking Toradol for endometriosis pain and that has helped in the past and she had some and is helped some of these pain she has had. She saw her primary care provider Dr. Drue NovelPaz in late July had a normal B12 and in late July CBC basic metabolic panel TSH vitamin D levels B12 folate and sedimentation rate were all okay.   She says the pain she gets from endometriosis is more around her cycle, and has not been associated with this bloating in GI type symptoms and has not been associated with eating at all. Overall she is much better on a Mirena intrauterine device.   Allergies  Allergen Reactions  . Morphine And Related   . Meloxicam Other (See Comments)   Current Meds  Medication Sig  . cetirizine (ZYRTEC) 10 MG tablet Take 10 mg by mouth daily.  . clobetasol ointment (TEMOVATE) 0.05 % Apply topically daily as needed.  . fluticasone (FLONASE) 50 MCG/ACT nasal spray Place 2 sprays into both nostrils daily.  Marland Kitchen. ketorolac (TORADOL) 10 MG tablet Take 10 mg by mouth every 6 (six) hours as needed.  Marland Kitchen. levonorgestrel (MIRENA) 20 MCG/24HR  IUD 1 each by Intrauterine route once.  . pentosan polysulfate (ELMIRON) 100 MG capsule Take 100 mg by mouth 2 (two) times daily. rx per gyn   Past Medical History:  Diagnosis Date  . Chronic interstitial cystitis   . Endometriosis   . Hyperlipidemia   . IUD (intrauterine device) in place    Mirena, Dr. Pennie Rushing  . Other psoriasis    Past Surgical History:  Procedure Laterality Date  . CESAREAN SECTION     2008, 2010  . CYSTO WITH HYDRODISTENSION  03/29/2012   Procedure:  CYSTOSCOPY/HYDRODISTENSION;  Surgeon: Martina Sinner, MD;  Location: [facility];  Service: Urology;  Laterality: N/A;  with Instillation of Marcaine and Pyridium   . HERNIA REPAIR  10/2010  . LAPAROSCOPY  03/29/2012   Procedure: LAPAROSCOPY OPERATIVE;  Surgeon: Hal Morales, MD;  Location: [facility];  Service: Gynecology;  Laterality: N/A;  with Fulguration of Endometriosis and Peritoneal Biopsy  . TONSILLECTOMY    . TUBAL LIGATION  2010   Social History   Social History  . Marital status: Married    Spouse name: N/A  . Number of children: 2  . Years of education: N/A   Occupational History  . spanish interpreter Unemployed   Social History Main Topics  . Smoking status: Never Smoker  . Smokeless tobacco: Never Used  . Alcohol use 0.6 oz/week    1 Glasses of wine per week     Comment: rarely   . Drug use: No  . Sexual activity: Yes    Partners: Male    Birth control/ protection: Surgical     Comment: BTL    Other Topics Concern  . Not on file   Social History Narrative   From [place] ; 2 daughters 2008, 2010   family history includes Diabetes in her paternal uncle; Heart disease in her other; Hyperlipidemia in her mother and sister.   Review of Systems As per history of present illness. All other review of systems are negative at this time.  Objective:   Physical Exam  (!) 90/50   Pulse 78   Ht  (1.499 m)   Wt 139 lb (63 kg)   BMI 28.07 kg/m @  General:  Well-developed, well-nourished and in no acute distress Eyes:  anicteric. Neck:   supple w/o thyromegaly or mass.  Lungs: Clear to auscultation bilaterally. Heart:  S1S2, no rubs, murmurs, gallops. Abdomen:  soft, non-tender, no hepatosplenomegaly, hernia, or mass and BS+. No umbilical hernia Extremities:   no edema, cyanosis or clubbing Skin   no rash. Neuro:  A&O x 3.  Psych:  appropriate mood and  Affect.   Data Reviewed: See history of present illness. I have reviewed primary care notes  labs imaging in the computer and gynecology notes.

## 2017-06-03 ENCOUNTER — Ambulatory Visit (HOSPITAL_COMMUNITY): Payer: BLUE CROSS/BLUE SHIELD

## 2017-06-06 ENCOUNTER — Ambulatory Visit (HOSPITAL_COMMUNITY)
Admission: RE | Admit: 2017-06-06 | Discharge: 2017-06-06 | Disposition: A | Discharge status: Home / Self Care | Payer: BLUE CROSS/BLUE SHIELD | Source: Ambulatory Visit | Attending: Internal Medicine | Admitting: Internal Medicine

## 2017-06-06 DIAGNOSIS — K8020 Calculus of gallbladder without cholecystitis without obstruction: Secondary | ICD-10-CM | POA: Diagnosis not present

## 2017-06-06 DIAGNOSIS — R140 Abdominal distension (gaseous): Secondary | ICD-10-CM | POA: Diagnosis not present

## 2017-06-06 DIAGNOSIS — R1010 Upper abdominal pain, unspecified: Secondary | ICD-10-CM | POA: Insufficient documentation

## 2017-06-07 ENCOUNTER — Telehealth: Payer: Self-pay

## 2017-06-07 ENCOUNTER — Ambulatory Visit (AMBULATORY_SURGERY_CENTER): Payer: BLUE CROSS/BLUE SHIELD | Admitting: Internal Medicine

## 2017-06-07 VITALS — BP 93/57 | HR 65 | Temp 99.1°F | Resp 12 | Ht 59.0 in | Wt 139.0 lb

## 2017-06-07 DIAGNOSIS — R1010 Upper abdominal pain, unspecified: Secondary | ICD-10-CM

## 2017-06-07 DIAGNOSIS — R109 Unspecified abdominal pain: Secondary | ICD-10-CM | POA: Diagnosis not present

## 2017-06-07 MED ORDER — SODIUM CHLORIDE 0.9 % IV SOLN: 500.0000 mL | INTRAVENOUS | Status: DC

## 2017-06-07 NOTE — Telephone Encounter (Signed)
-----   Message from Iva Boop, MD sent at 06/07/2017 11:17 AM EDT ----- Regarding: refer to Dr. Carman Ching EGD today neg  Has gallstone  Has seen Dr. Magnus Ivan need to refer back  thx

## 2017-06-07 NOTE — Progress Notes (Signed)
Gallstone Told her at EGD [facility] referral EGD was NL

## 2017-06-07 NOTE — Patient Instructions (Addendum)
The endoscopy looks fine  You have a gallstone and should see a surgeon about that and they can also evaluate and treat the sore belly button.  My office will refer you to [facility].  I appreciate the opportunity to care for you. Iva Boop, MD, FACG YOU HAD AN ENDOSCOPIC PROCEDURE TODAY AT THE [facility]:   Refer to the procedure report that was given to you for any specific questions about what was found during the examination.  If the procedure report does not answer your questions, please call your gastroenterologist to clarify.  If you requested that your care partner not be given the details of your procedure findings, then the procedure report has been included in a sealed envelope for you to review at your convenience later.  YOU SHOULD EXPECT: Some feelings of bloating in the abdomen. Passage of more gas than usual.  Walking can help get rid of the air that was put into your GI tract during the procedure and reduce the bloating. If you had a lower endoscopy (such as a colonoscopy or flexible sigmoidoscopy) you may notice spotting of blood in your stool or on the toilet paper. If you underwent a bowel prep for your procedure, you may not have a normal bowel movement for a few days.  Please Note:  You might notice some irritation and congestion in your nose or some drainage.  This is from the oxygen used during your procedure.  There is no need for concern and it should clear up in a day or so.  SYMPTOMS TO REPORT IMMEDIATELY:   Following upper endoscopy (EGD)  Vomiting of blood or coffee ground material  New chest pain or pain under the shoulder blades  Painful or persistently difficult swallowing  New shortness of breath  Fever of 100F or higher  Black, tarry-looking stools  For urgent or emergent issues, a gastroenterologist can be reached at any hour by calling [phone].   DIET:  We do recommend a small meal at first, but then  you may proceed to your regular diet.  Drink plenty of fluids but you should avoid alcoholic beverages for 24 hours.  ACTIVITY:  You should plan to take it easy for the rest of today and you should NOT DRIVE or use heavy machinery until tomorrow (because of the sedation medicines used during the test).    FOLLOW UP: Our staff will call the number listed on your records the next business day following your procedure to check on you and address any questions or concerns that you may have regarding the information given to you following your procedure. If we do not reach you, we will leave a message.  However, if you are feeling well and you are not experiencing any problems, there is no need to return our call.  We will assume that you have returned to your regular daily activities without incident.  If any biopsies were taken you will be contacted by phone or by letter within the next 1-3 weeks.  Please call us at [phone] if you have not heard about the biopsies in 3 weeks.    SIGNATURES/CONFIDENTIALITY: You and/or your care partner have signed paperwork which will be entered into your electronic medical record.  These signatures attest to the fact that that the information above on your After Visit Summary has been reviewed and is understood.  Full responsibility of the confidentiality of this discharge information lies with you and/or your care-partner.

## 2017-06-07 NOTE — Progress Notes (Signed)
Report given to PACU, vss 

## 2017-06-07 NOTE — Progress Notes (Signed)
Pt's states no medical or surgical changes since previsit or office visit. 

## 2017-06-07 NOTE — Op Note (Signed)
[facility] Patient Name: Sierra Hodge Procedure Date: 06/07/2017 9:28 AM MRN: [id number] Endoscopist: Iva Boop , MD Age: 41 Referring MD:  Date of Birth: 1976-09-09 Gender: Female Account #: [id number] Procedure:                Upper GI endoscopy Indications:              Upper abdominal pain Medicines:                Propofol per Anesthesia, Monitored Anesthesia Care Procedure:                Pre-Anesthesia Assessment:                           - Prior to the procedure, a History and Physical                            was performed, and patient medications and                            allergies were reviewed. The patient's tolerance of                            previous anesthesia was also reviewed. The risks                            and benefits of the procedure and the sedation                            options and risks were discussed with the patient.                            All questions were answered, and informed consent                            was obtained. Prior Anticoagulants: The patient has                            taken no previous anticoagulant or antiplatelet                            agents. ASA Grade Assessment: II - A patient with                            mild systemic disease. After reviewing the risks                            and benefits, the patient was deemed in                            satisfactory condition to undergo the procedure.                           After obtaining informed consent, the endoscope was  passed under direct vision. Throughout the                            procedure, the patient's blood pressure, pulse, and                            oxygen saturations were monitored continuously. The                            Endoscope was introduced through the mouth, and                            advanced to the second part of duodenum. The upper                            GI  endoscopy was accomplished without difficulty.                            The patient tolerated the procedure well. Scope In: Scope Out: Findings:                 The esophagus was normal.                           The stomach was normal.                           The examined duodenum was normal.                           The cardia and gastric fundus were normal on                            retroflexion. Complications:            No immediate complications. Estimated Blood Loss:     Estimated blood loss: none. Impression:               - Normal esophagus.                           - Normal stomach.                           - Normal examined duodenum.                           - No specimens collected. Recommendation:           - Patient has a contact number available for                            emergencies. The signs and symptoms of potential                            delayed complications were discussed with the  patient. Return to normal activities tomorrow.                            Written discharge instructions were provided to the                            patient.                           - Resume previous diet.                           - Continue present medications.                           - She hass cholelithiasis - I will make a referral                            to [facility] for that and painful                            umbilicus s/p hernia repair Iva Boop, MD 06/07/2017 9:49:39 AM This report has been signed electronically.

## 2017-06-07 NOTE — Telephone Encounter (Signed)
Referral faxed to [facility] 

## 2017-06-08 ENCOUNTER — Telehealth: Payer: Self-pay | Admitting: *Deleted

## 2017-06-08 NOTE — Telephone Encounter (Signed)
Message left

## 2017-06-08 NOTE — Telephone Encounter (Signed)
  Follow up Call-  Call back number 06/07/2017  Post procedure Call Back phone  # [phone]  Permission to leave phone message Yes  Some recent data might be hidden     Patient questions:  Do you have a fever, pain , or abdominal swelling? No. Pain Score  0 *  Have you tolerated food without any problems? Yes.    Have you been able to return to your normal activities? Yes.    Do you have any questions about your discharge instructions: Diet   No. Medications  No. Follow up visit  No.  Do you have questions or concerns about your Care? No.  Actions: * If pain score is 4 or above: No action needed, pain <4.

## 2017-06-10 ENCOUNTER — Telehealth: Payer: Self-pay

## 2017-06-10 NOTE — Telephone Encounter (Signed)
Pt has an appt with Dr. Magnus Ivan at [facility] on 06/22/17 at 9:50. Pt made ware by their office.

## 2017-06-22 ENCOUNTER — Other Ambulatory Visit: Payer: Self-pay | Admitting: Surgery

## 2017-06-22 DIAGNOSIS — K8020 Calculus of gallbladder without cholecystitis without obstruction: Secondary | ICD-10-CM | POA: Diagnosis not present

## 2017-06-24 ENCOUNTER — Encounter: Payer: Self-pay | Admitting: Internal Medicine

## 2017-06-24 ENCOUNTER — Ambulatory Visit (INDEPENDENT_AMBULATORY_CARE_PROVIDER_SITE_OTHER): Payer: BLUE CROSS/BLUE SHIELD | Admitting: Internal Medicine

## 2017-06-24 VITALS — BP 124/78 | HR 79 | Temp 97.9°F | Resp 14 | Ht 59.0 in | Wt 140.4 lb

## 2017-06-24 DIAGNOSIS — Z23 Encounter for immunization: Secondary | ICD-10-CM

## 2017-06-24 DIAGNOSIS — Z0000 Encounter for general adult medical examination without abnormal findings: Secondary | ICD-10-CM | POA: Diagnosis not present

## 2017-06-24 DIAGNOSIS — J3489 Other specified disorders of nose and nasal sinuses: Secondary | ICD-10-CM

## 2017-06-24 DIAGNOSIS — R519 Headache, unspecified: Secondary | ICD-10-CM

## 2017-06-24 DIAGNOSIS — R51 Headache: Secondary | ICD-10-CM

## 2017-06-24 MED ORDER — CLOBETASOL PROPIONATE 0.05 % EX OINT: TOPICAL_OINTMENT | Freq: Every day | CUTANEOUS | 5 refills | Status: DC | PRN

## 2017-06-24 MED ORDER — AZELASTINE HCL 0.1 % NA SOLN: 2.0000 | Freq: Every evening | NASAL | 3 refills | Status: DC | PRN

## 2017-06-24 NOTE — Progress Notes (Signed)
Subjective:    Patient ID: Sierra Hodge, female    DOB: Mar 27, 1976, 41 y.o.   MRN: [id number]  DOS:  06/24/2017 Type of visit - description : cpx Interval history:Several concerns, see review of systems    Review of Systems   Continue with fatigue. Appetite has increased. Wt Readings from Last 3 Encounters:  06/24/17 140 lb 6 oz (63.7 kg)  06/07/17 139 lb (63 kg)  05/30/17 139 lb (63 kg)   Continue with  a "sinus headache", reports maxillary and frontal congestion, nuchal pain, occasionally she gets dizzy with head movements. Denies fever chills Admits to some sneezing but no itching eyes or nose. Occasional blurred vision and light intolerance. Admits to stress due to a number of issues but denies anxiety or depression per se, "I'm still very functional".  Other than above, a 14 point review of systems is negative   Past Medical History:  Diagnosis Date  . Chronic interstitial cystitis   . Endometriosis   . Hyperlipidemia   . IUD (intrauterine device) in place    Mirena, Dr. Pennie Rushing  . Other psoriasis     Past Surgical History:  Procedure Laterality Date  . CESAREAN SECTION     2008, 2010  . CYSTO WITH HYDRODISTENSION  03/29/2012   Procedure: CYSTOSCOPY/HYDRODISTENSION;  Surgeon: Martina Sinner, MD;  Location: [facility];  Service: Urology;  Laterality: N/A;  with Instillation of Marcaine and Pyridium   . LAPAROSCOPY  03/29/2012   Procedure: LAPAROSCOPY OPERATIVE;  Surgeon: Hal Morales, MD;  Location: [facility];  Service: Gynecology;  Laterality: N/A;  with Fulguration of Endometriosis and Peritoneal Biopsy  . TONSILLECTOMY    . TUBAL LIGATION  2010  . UMBILICAL HERNIA REPAIR  10/2010    Social History   Social History  . Marital status: Married    Spouse name: N/A  . Number of children: 2  . Years of education: N/A   Occupational History  . Medical interpreter     Spanish   Social History Main Topics  . Smoking status: Never Smoker  . Smokeless  tobacco: Never Used  . Alcohol use 0.6 oz/week    1 Glasses of wine per week     Comment: rarely   . Drug use: No  . Sexual activity: Yes    Partners: Male    Birth control/ protection: Surgical     Comment: BTL    Other Topics Concern  . Not on file   Social History Narrative   Married    Employed as a Research officer, trade union I think mainly medical   From [place] ; 2 daughters 2008, 2010             Family History  Problem Relation Age of Onset  . Hyperlipidemia Mother   . Heart disease Other        paternal family  . Diabetes Paternal Uncle   . Hyperlipidemia Sister   . Colon cancer Neg Hx   . Breast cancer Neg Hx      Allergies as of 06/24/2017      Reactions   Morphine And Related    Meloxicam Other (See Comments)      Medication List       Accurate as of 06/24/17 11:59 PM. Always use your most recent med list.          azelastine 0.1 % nasal spray Commonly known as:  ASTELIN Place 2 sprays into both nostrils at bedtime as needed  for rhinitis. Use in each nostril as directed   cetirizine 10 MG tablet Commonly known as:  ZYRTEC Take 10 mg by mouth daily.   clobetasol ointment 0.05 % Commonly known as:  TEMOVATE Apply topically daily as needed.   fluticasone 50 MCG/ACT nasal spray Commonly known as:  FLONASE Place 2 sprays into both nostrils daily.   ketorolac 10 MG tablet Commonly known as:  TORADOL Take 10 mg by mouth every 6 (six) hours as needed.   levonorgestrel 20 MCG/24HR IUD Commonly known as:  MIRENA 1 each by Intrauterine route once.   pentosan polysulfate 100 MG capsule Commonly known as:  ELMIRON Take 100 mg by mouth 2 (two) times daily. rx per gyn          Objective:   Physical Exam BP 124/78 (BP Location: Right Arm, Patient Position: Sitting, Cuff Size: Small)   Pulse 79   Temp 97.9 F (36.6 C) (Oral)   Resp 14   Ht  (1.499 m)   Wt 140 lb 6 oz (63.7 kg)   SpO2 98%   BMI 28.35 kg/m   General:   Well  developed, well nourished . NAD.  Neck: No  thyromegaly  HEENT:  Normocephalic . Face symmetric, atraumatic. Nose congested, TMs normal, sinuses: Slightly TTP throughout the frontal and maxillary areas. EOMI. Lungs:  CTA B Normal respiratory effort, no intercostal retractions, no accessory muscle use. Heart: RRR,  no murmur.  No pretibial edema bilaterally  Abdomen:  Not distended, mildly tender throughout, no mass or rebound (chronic finding per patient) Skin: Exposed areas without rash. Not pale. Not jaundice Neurologic:  alert & oriented X3.  Speech normal, gait appropriate for age and unassisted Strength symmetric and appropriate for age.  Psych: Cognition and judgment appear intact.  Cooperative with normal attention span and concentration.  Behavior appropriate. No anxious or depressed appearing.    Assessment & Plan:   Assessment Hyperlipidemia Chronic Interstitial Cystitis (on elmiron, rx per gyn, no recent urology visit as of 05-2016) H/o psoriasis - seen Dr Lendon Colonel before  Endometriosis -- has a Mirena BTL  PLAN Fatigue, h/o recent paresthesias: see last OV, sxs unchanged, denies snoring, blood work was completely normal. Observation for now Headaches: Ongoing problem, the patient called  "Sinus headaches". On exam her nose is slightly congested and she is a slightly TTP at the maxillary and   frontal areas. Recent sedimentation rate negative. Plan:  R/o a true sinus issue: CT sinuses, Astelin, Flonase, antihistaminics.  Neurology referral. Tensional headache? RTC 4 months

## 2017-06-24 NOTE — Progress Notes (Signed)
Pre visit review using our clinic review tool, if applicable. No additional management support is needed unless otherwise documented below in the visit note. 

## 2017-06-24 NOTE — Assessment & Plan Note (Addendum)
-  Td 2015, flu shot today -CCS: n/a, never had a cscope  -Sees gyn for female care , Dr Pennie Rushing  -Diet-exercise: Discussed -Labs: RTC fasting for a FLP, LFTs.

## 2017-06-24 NOTE — Patient Instructions (Signed)
  GO TO THE FRONT DESK Schedule labs to be done fasting next week Schedule your next appointment for a  checkup in 4 months  We are scheduling a CT of the sinuses We are referring you to neurology

## 2017-06-25 NOTE — Assessment & Plan Note (Signed)
Fatigue, h/o recent paresthesias: see last OV, sxs unchanged, denies snoring, blood work was completely normal. Observation for now Headaches: Ongoing problem, the patient called  "Sinus headaches". On exam her nose is slightly congested and she is a slightly TTP at the maxillary and   frontal areas. Recent sedimentation rate negative. Plan:  R/o a true sinus issue: CT sinuses, Astelin, Flonase, antihistaminics.  Neurology referral. Tensional headache? RTC 4 months

## 2017-06-27 ENCOUNTER — Ambulatory Visit (HOSPITAL_BASED_OUTPATIENT_CLINIC_OR_DEPARTMENT_OTHER)
Admission: RE | Admit: 2017-06-27 | Discharge: 2017-06-27 | Disposition: A | Discharge status: Home / Self Care | Payer: BLUE CROSS/BLUE SHIELD | Source: Ambulatory Visit | Attending: Internal Medicine | Admitting: Internal Medicine

## 2017-06-27 ENCOUNTER — Other Ambulatory Visit (INDEPENDENT_AMBULATORY_CARE_PROVIDER_SITE_OTHER): Payer: BLUE CROSS/BLUE SHIELD

## 2017-06-27 DIAGNOSIS — J3489 Other specified disorders of nose and nasal sinuses: Secondary | ICD-10-CM | POA: Diagnosis not present

## 2017-06-27 DIAGNOSIS — Z0000 Encounter for general adult medical examination without abnormal findings: Secondary | ICD-10-CM | POA: Diagnosis not present

## 2017-06-27 DIAGNOSIS — J329 Chronic sinusitis, unspecified: Secondary | ICD-10-CM | POA: Diagnosis not present

## 2017-06-27 DIAGNOSIS — R51 Headache: Secondary | ICD-10-CM | POA: Insufficient documentation

## 2017-06-27 LAB — HEPATIC FUNCTION PANEL
ALK PHOS: 48 U/L (ref 39–117)
ALT: 27 U/L (ref 0–35)
AST: 19 U/L (ref 0–37)
Albumin: 4.2 g/dL (ref 3.5–5.2)
Bilirubin, Direct: 0.1 mg/dL (ref 0.0–0.3)
Total Bilirubin: 0.9 mg/dL (ref 0.2–1.2)
Total Protein: 6.9 g/dL (ref 6.0–8.3)

## 2017-06-27 LAB — LIPID PANEL
CHOLESTEROL: 207 mg/dL — AB (ref 0–200)
HDL: 46.1 mg/dL (ref >39.00)
LDL Cholesterol: 143 mg/dL — ABNORMAL HIGH (ref 0–99)
NONHDL: 161.06
Total CHOL/HDL Ratio: 4
Triglycerides: 88 mg/dL (ref 0.0–149.0)
VLDL: 17.6 mg/dL (ref 0.0–40.0)

## 2017-06-29 ENCOUNTER — Encounter (HOSPITAL_BASED_OUTPATIENT_CLINIC_OR_DEPARTMENT_OTHER): Payer: Self-pay | Admitting: *Deleted

## 2017-07-06 NOTE — H&P (Signed)
Sierra HomansMarlen Hodge 06/22/2017 10:33 AM Location: [facility] Patient #: [id number] DOB: 14-Mar-1976 Married / Language: English / Race: Refused to Report/Unreported Female   History of Present Illness (Williams Dietrick A. Magnus IvanBlackman MD; 06/22/2017 10:54 AM) The patient is a 41 year old female who presents for evaluation of gall stones. This patient is referred to me by Dr. Stan Headarl Gessner for evaluation of symptomatic cholelithiasis. I have actually performed umbilical hernia repair surgery on her back in 2012. Since then, she has had surgery for endometriosis. She has since been having periumbilical abdominal pain with epigastric abdominal pain, bloating, and nausea with pain through to the back after fatty meals. She had a recent upper endoscopy which is unremarkable. An ultrasound showed gallstones with one stone measuring 1.3 cm. She has had no lower pelvic pain. The pain is sharp and moderate to severe. She is at 4 attacks in the past month. Again, it is always after fatty meals. She is otherwise without complaints.   Past Surgical History Ethlyn Gallery(Alisha Spillers, CMA; 06/22/2017 10:33 AM) Cesarean Section - 1  Tonsillectomy   Diagnostic Studies History Ethlyn Gallery(Alisha Spillers, CMA; 06/22/2017 10:33 AM) Colonoscopy  never Mammogram  within last year Pap Smear  1-5 years ago  Allergies Elease Hashimoto(Alisha Spillers, CMA; 06/22/2017 10:34 AM) Morphine Sulfate (PF) *ANALGESICS - OPIOID*  Meloxicam *CHEMICALS*   Medication History (Alisha Spillers, CMA; 06/22/2017 10:35 AM) Elmiron (100MG  Capsule, Oral) Active. ZyrTEC Allergy (10MG  Capsule, Oral) Active. Medications Reconciled  Pregnancy / Birth History Ethlyn Gallery(Alisha Spillers, CMA; 06/22/2017 10:33 AM) Age at menarche  11 years. Contraceptive History  Intrauterine device. Gravida  2 Length (months) of breastfeeding  12-24 Maternal age  43-35 Para  2  Other Problems Ethlyn Gallery(Alisha Spillers, CMA; 06/22/2017 10:33 AM) Bladder Problems  Hypercholesterolemia   Umbilical Hernia Repair     Review of Systems (Alisha Spillers CMA; 06/22/2017 10:33 AM) General Present- Fatigue. Not Present- Appetite Loss, Chills, Fever, Night Sweats, Weight Gain and Weight Loss. HEENT Present- Seasonal Allergies and Sinus Pain. Not Present- Earache, Hearing Loss, Hoarseness, Nose Bleed, Oral Ulcers, Ringing in the Ears, Sore Throat, Visual Disturbances, Wears glasses/contact lenses and Yellow Eyes. Gastrointestinal Present- Abdominal Pain and Change in Bowel Habits. Not Present- Bloating, Bloody Stool, Chronic diarrhea, Constipation, Difficulty Swallowing, Excessive gas, Gets full quickly at meals, Hemorrhoids, Indigestion, Nausea, Rectal Pain and Vomiting. Female Genitourinary Present- Pelvic Pain. Not Present- Frequency, Nocturia, Painful Urination and Urgency. Musculoskeletal Present- Back Pain. Not Present- Joint Pain, Joint Stiffness, Muscle Pain, Muscle Weakness and Swelling of Extremities. Neurological Present- Headaches. Not Present- Decreased Memory, Fainting, Numbness, Seizures, Tingling, Tremor, Trouble walking and Weakness.  Vitals (Alisha Spillers CMA; 06/22/2017 10:34 AM) 06/22/2017 10:34 AM Weight: 140 lb Height: 59in Body Surface Area: 1.58 m Body Mass Index: 28.28 kg/m  Pulse: 78 (Regular)  BP: 100/62 (Sitting, Left Arm, Standard)       Physical Exam (Jemari Hallum A. Magnus IvanBlackman MD; 06/22/2017 10:55 AM) General Mental Status-Alert. General Appearance-Consistent with stated age. Hydration-Well hydrated. Voice-Normal.  Head and Neck Head-normocephalic, atraumatic with no lesions or palpable masses.  Eye Eyeball - Bilateral-Extraocular movements intact. Sclera/Conjunctiva - Bilateral-No scleral icterus.  Chest and Lung Exam Chest and lung exam reveals -quiet, even and easy respiratory effort with no use of accessory muscles and on auscultation, normal breath sounds, no adventitious sounds and normal vocal  resonance. Inspection Chest Wall - Normal. Back - normal.  Cardiovascular Cardiovascular examination reveals -on palpation PMI is normal in location and amplitude, no palpable S3 or S4. Normal cardiac borders., normal heart  sounds, regular rate and rhythm with no murmurs, carotid auscultation reveals no bruits and normal pedal pulses bilaterally.  Abdomen Inspection Inspection of the abdomen reveals - No Hernias. Skin - Scar - no surgical scars. Palpation/Percussion Palpation and Percussion of the abdomen reveal - Soft, Non Tender, No Rebound tenderness, No Rigidity (guarding) and No hepatosplenomegaly. Auscultation Auscultation of the abdomen reveals - Bowel sounds normal. Note: I can feel no hernia at the umbilicus   Neurologic Neurologic evaluation reveals -alert and oriented x 3 with no impairment of recent or remote memory. Mental Status-Normal.  Musculoskeletal Normal Exam - Left-Upper Extremity Strength Normal and Lower Extremity Strength Normal. Normal Exam - Right-Upper Extremity Strength Normal, Lower Extremity Weakness.    Assessment & Plan (Desmund Elman A. Magnus Ivan MD; 06/22/2017 10:56 AM) SYMPTOMATIC CHOLELITHIASIS (K80.20) Impression: I discussed the diagnosis with her in detail and gave her literature regarding this. I believe she has symptomatic gallstones and that her symptoms are not related to endometriosis given the nature and her history. I am recommending a laparoscopic cholecystectomy and she is in complete agreement. We discussed the surgical procedure in detail. We discussed the risks which includes but is not limited to bleeding, infection, injury to surrounding structures, the need to convert to an open procedure, cardiopulmonary issues, the chance this may not resolve her symptoms, postoperative recovery, etc. She understands and agrees to proceed. Surgery will be scheduled

## 2017-07-07 ENCOUNTER — Encounter (HOSPITAL_BASED_OUTPATIENT_CLINIC_OR_DEPARTMENT_OTHER)
Admission: RE | Disposition: A | Discharge status: Home / Self Care | Payer: Self-pay | Source: Ambulatory Visit | Attending: Surgery

## 2017-07-07 ENCOUNTER — Ambulatory Visit (HOSPITAL_BASED_OUTPATIENT_CLINIC_OR_DEPARTMENT_OTHER): Payer: BLUE CROSS/BLUE SHIELD | Admitting: Certified Registered Nurse Anesthetist

## 2017-07-07 ENCOUNTER — Encounter (HOSPITAL_BASED_OUTPATIENT_CLINIC_OR_DEPARTMENT_OTHER): Payer: Self-pay | Admitting: *Deleted

## 2017-07-07 ENCOUNTER — Ambulatory Visit (HOSPITAL_BASED_OUTPATIENT_CLINIC_OR_DEPARTMENT_OTHER)
Admission: RE | Admit: 2017-07-07 | Discharge: 2017-07-07 | Disposition: A | Discharge status: Home / Self Care | Payer: BLUE CROSS/BLUE SHIELD | Source: Ambulatory Visit | Attending: Surgery | Admitting: Surgery

## 2017-07-07 DIAGNOSIS — N3010 Interstitial cystitis (chronic) without hematuria: Secondary | ICD-10-CM | POA: Diagnosis not present

## 2017-07-07 DIAGNOSIS — K8020 Calculus of gallbladder without cholecystitis without obstruction: Secondary | ICD-10-CM | POA: Diagnosis not present

## 2017-07-07 DIAGNOSIS — N809 Endometriosis, unspecified: Secondary | ICD-10-CM | POA: Diagnosis not present

## 2017-07-07 DIAGNOSIS — K8010 Calculus of gallbladder with chronic cholecystitis without obstruction: Secondary | ICD-10-CM | POA: Insufficient documentation

## 2017-07-07 DIAGNOSIS — J309 Allergic rhinitis, unspecified: Secondary | ICD-10-CM | POA: Diagnosis not present

## 2017-07-07 HISTORY — DX: Adverse effect of unspecified anesthetic, initial encounter: T41.45XA

## 2017-07-07 HISTORY — PX: CHOLECYSTECTOMY: SHX55

## 2017-07-07 HISTORY — DX: Other complications of anesthesia, initial encounter: T88.59XA

## 2017-07-07 SURGERY — LAPAROSCOPIC CHOLECYSTECTOMY
Anesthesia: General | Site: Abdomen

## 2017-07-07 MED ORDER — PROPOFOL 10 MG/ML IV BOLUS
INTRAVENOUS | Status: AC
  Filled 2017-07-07: qty 20

## 2017-07-07 MED ORDER — ONDANSETRON HCL 4 MG/2ML IJ SOLN
INTRAMUSCULAR | Status: AC
  Filled 2017-07-07: qty 2

## 2017-07-07 MED ORDER — CHLORHEXIDINE GLUCONATE CLOTH 2 % EX PADS: 6.0000 | MEDICATED_PAD | Freq: Once | CUTANEOUS | Status: DC

## 2017-07-07 MED ORDER — MIDAZOLAM HCL 2 MG/2ML IJ SOLN
INTRAMUSCULAR | Status: DC | PRN
  Administered 2017-07-07: 2 mg via INTRAVENOUS

## 2017-07-07 MED ORDER — CEFAZOLIN SODIUM-DEXTROSE 2-4 GM/100ML-% IV SOLN: 2.0000 g | INTRAVENOUS | Status: DC

## 2017-07-07 MED ORDER — SUGAMMADEX SODIUM 200 MG/2ML IV SOLN
INTRAVENOUS | Status: AC
  Filled 2017-07-07: qty 2

## 2017-07-07 MED ORDER — TRAMADOL HCL 50 MG PO TABS: 50.0000 mg | ORAL_TABLET | Freq: Four times a day (QID) | ORAL | 0 refills | Status: DC | PRN

## 2017-07-07 MED ORDER — ACETAMINOPHEN 650 MG RE SUPP: 650.0000 mg | RECTAL | Status: DC | PRN

## 2017-07-07 MED ORDER — DEXAMETHASONE SODIUM PHOSPHATE 10 MG/ML IJ SOLN
INTRAMUSCULAR | Status: AC
  Filled 2017-07-07: qty 1

## 2017-07-07 MED ORDER — BUPIVACAINE-EPINEPHRINE (PF) 0.5% -1:200000 IJ SOLN
INTRAMUSCULAR | Status: DC | PRN
  Administered 2017-07-07: 20 mL

## 2017-07-07 MED ORDER — METOCLOPRAMIDE HCL 5 MG/ML IJ SOLN: 10.0000 mg | Freq: Once | INTRAMUSCULAR | Status: DC | PRN

## 2017-07-07 MED ORDER — LACTATED RINGERS IV SOLN: INTRAVENOUS | Status: DC

## 2017-07-07 MED ORDER — ACETAMINOPHEN 325 MG PO TABS
ORAL_TABLET | ORAL | Status: AC
  Filled 2017-07-07: qty 2

## 2017-07-07 MED ORDER — TRAMADOL HCL 50 MG PO TABS
50.0000 mg | ORAL_TABLET | Freq: Once | ORAL | Status: AC | PRN
  Administered 2017-07-07: 50 mg via ORAL

## 2017-07-07 MED ORDER — SODIUM CHLORIDE 0.9% FLUSH: 3.0000 mL | Freq: Two times a day (BID) | INTRAVENOUS | Status: DC

## 2017-07-07 MED ORDER — MEPERIDINE HCL 25 MG/ML IJ SOLN: 6.2500 mg | INTRAMUSCULAR | Status: DC | PRN

## 2017-07-07 MED ORDER — FENTANYL CITRATE (PF) 100 MCG/2ML IJ SOLN: 25.0000 ug | INTRAMUSCULAR | Status: DC | PRN

## 2017-07-07 MED ORDER — CEFAZOLIN SODIUM-DEXTROSE 2-4 GM/100ML-% IV SOLN
INTRAVENOUS | Status: AC
  Filled 2017-07-07: qty 100

## 2017-07-07 MED ORDER — OXYCODONE HCL 5 MG PO TABS: 5.0000 mg | ORAL_TABLET | ORAL | Status: DC | PRN

## 2017-07-07 MED ORDER — MIDAZOLAM HCL 2 MG/2ML IJ SOLN
INTRAMUSCULAR | Status: AC
  Filled 2017-07-07: qty 2

## 2017-07-07 MED ORDER — SODIUM CHLORIDE 0.9% FLUSH: 3.0000 mL | INTRAVENOUS | Status: DC | PRN

## 2017-07-07 MED ORDER — KETOROLAC TROMETHAMINE 30 MG/ML IJ SOLN
INTRAMUSCULAR | Status: AC
  Filled 2017-07-07: qty 1

## 2017-07-07 MED ORDER — KETOROLAC TROMETHAMINE 30 MG/ML IJ SOLN
INTRAMUSCULAR | Status: DC | PRN
  Administered 2017-07-07: 30 mg via INTRAVENOUS

## 2017-07-07 MED ORDER — ACETAMINOPHEN 325 MG PO TABS: 650.0000 mg | ORAL_TABLET | ORAL | Status: DC | PRN

## 2017-07-07 MED ORDER — SCOPOLAMINE 1 MG/3DAYS TD PT72
1.0000 | MEDICATED_PATCH | Freq: Once | TRANSDERMAL | Status: AC | PRN
  Administered 2017-07-07: 1 via TRANSDERMAL

## 2017-07-07 MED ORDER — SODIUM CHLORIDE 0.9 % IV SOLN: 250.0000 mL | INTRAVENOUS | Status: DC | PRN

## 2017-07-07 MED ORDER — PHENYLEPHRINE 40 MCG/ML (10ML) SYRINGE FOR IV PUSH (FOR BLOOD PRESSURE SUPPORT)
PREFILLED_SYRINGE | INTRAVENOUS | Status: DC | PRN
  Administered 2017-07-07: 80 ug via INTRAVENOUS

## 2017-07-07 MED ORDER — LIDOCAINE HCL (PF) 1 % IJ SOLN
INTRAMUSCULAR | Status: AC
  Filled 2017-07-07: qty 30

## 2017-07-07 MED ORDER — TRAMADOL HCL 50 MG PO TABS
ORAL_TABLET | ORAL | Status: AC
  Filled 2017-07-07: qty 1

## 2017-07-07 MED ORDER — DEXAMETHASONE SODIUM PHOSPHATE 10 MG/ML IJ SOLN
INTRAMUSCULAR | Status: DC | PRN
  Administered 2017-07-07: 10 mg via INTRAVENOUS

## 2017-07-07 MED ORDER — CEFAZOLIN SODIUM-DEXTROSE 2-4 GM/100ML-% IV SOLN
2.0000 g | INTRAVENOUS | Status: AC
  Administered 2017-07-07: 2 g via INTRAVENOUS

## 2017-07-07 MED ORDER — LIDOCAINE 2% (20 MG/ML) 5 ML SYRINGE
INTRAMUSCULAR | Status: DC | PRN
  Administered 2017-07-07: 60 mg via INTRAVENOUS

## 2017-07-07 MED ORDER — LIDOCAINE 2% (20 MG/ML) 5 ML SYRINGE
INTRAMUSCULAR | Status: AC
  Filled 2017-07-07: qty 5

## 2017-07-07 MED ORDER — FENTANYL CITRATE (PF) 100 MCG/2ML IJ SOLN
INTRAMUSCULAR | Status: DC | PRN
  Administered 2017-07-07 (×2): 50 ug via INTRAVENOUS

## 2017-07-07 MED ORDER — SUGAMMADEX SODIUM 200 MG/2ML IV SOLN
INTRAVENOUS | Status: DC | PRN
  Administered 2017-07-07: 200 mg via INTRAVENOUS

## 2017-07-07 MED ORDER — ROCURONIUM BROMIDE 50 MG/5ML IV SOSY
PREFILLED_SYRINGE | INTRAVENOUS | Status: DC | PRN
  Administered 2017-07-07: 40 mg via INTRAVENOUS

## 2017-07-07 MED ORDER — SCOPOLAMINE 1 MG/3DAYS TD PT72
MEDICATED_PATCH | TRANSDERMAL | Status: AC
  Filled 2017-07-07: qty 1

## 2017-07-07 MED ORDER — LACTATED RINGERS IV SOLN
INTRAVENOUS | Status: DC | PRN
  Administered 2017-07-07 (×2): via INTRAVENOUS

## 2017-07-07 MED ORDER — BUPIVACAINE-EPINEPHRINE (PF) 0.5% -1:200000 IJ SOLN
INTRAMUSCULAR | Status: AC
  Filled 2017-07-07: qty 30

## 2017-07-07 MED ORDER — PROPOFOL 10 MG/ML IV BOLUS
INTRAVENOUS | Status: DC | PRN
  Administered 2017-07-07: 150 mg via INTRAVENOUS

## 2017-07-07 MED ORDER — FENTANYL CITRATE (PF) 100 MCG/2ML IJ SOLN
INTRAMUSCULAR | Status: AC
  Filled 2017-07-07: qty 2

## 2017-07-07 MED ORDER — PHENYLEPHRINE 40 MCG/ML (10ML) SYRINGE FOR IV PUSH (FOR BLOOD PRESSURE SUPPORT)
PREFILLED_SYRINGE | INTRAVENOUS | Status: AC
  Filled 2017-07-07: qty 10

## 2017-07-07 MED ORDER — EPINEPHRINE 30 MG/30ML IJ SOLN
INTRAMUSCULAR | Status: AC
  Filled 2017-07-07: qty 1

## 2017-07-07 MED ORDER — ONDANSETRON HCL 4 MG/2ML IJ SOLN
INTRAMUSCULAR | Status: DC | PRN
  Administered 2017-07-07: 4 mg via INTRAVENOUS

## 2017-07-07 MED ORDER — FENTANYL CITRATE (PF) 100 MCG/2ML IJ SOLN: 50.0000 ug | INTRAMUSCULAR | Status: DC | PRN

## 2017-07-07 MED ORDER — ROCURONIUM BROMIDE 10 MG/ML (PF) SYRINGE
PREFILLED_SYRINGE | INTRAVENOUS | Status: AC
  Filled 2017-07-07: qty 5

## 2017-07-07 MED ORDER — BUPIVACAINE-EPINEPHRINE (PF) 0.25% -1:200000 IJ SOLN
INTRAMUSCULAR | Status: AC
  Filled 2017-07-07: qty 30

## 2017-07-07 MED ORDER — MIDAZOLAM HCL 2 MG/2ML IJ SOLN: 1.0000 mg | INTRAMUSCULAR | Status: DC | PRN

## 2017-07-07 SURGICAL SUPPLY — 37 items
APPLIER CLIP 5 13 M/L LIGAMAX5 (MISCELLANEOUS) ×3
BLADE CLIPPER SURG (BLADE) IMPLANT
CHLORAPREP W/TINT 26ML (MISCELLANEOUS) ×3 IMPLANT
CLIP APPLIE 5 13 M/L LIGAMAX5 (MISCELLANEOUS) ×1 IMPLANT
COVER MAYO STAND STRL (DRAPES) IMPLANT
DECANTER SPIKE VIAL GLASS SM (MISCELLANEOUS) ×3 IMPLANT
DERMABOND ADVANCED (GAUZE/BANDAGES/DRESSINGS) ×2
DERMABOND ADVANCED .7 DNX12 (GAUZE/BANDAGES/DRESSINGS) ×1 IMPLANT
DRAPE C-ARM 42X72 X-RAY (DRAPES) IMPLANT
ELECT REM PT RETURN 9FT ADLT (ELECTROSURGICAL) ×3
ELECTRODE REM PT RTRN 9FT ADLT (ELECTROSURGICAL) ×1 IMPLANT
FILTER SMOKE EVAC LAPAROSHD (FILTER) IMPLANT
GLOVE BIO SURGEON STRL SZ 6.5 (GLOVE) ×2 IMPLANT
GLOVE BIO SURGEON STRL SZ7 (GLOVE) ×3 IMPLANT
GLOVE BIO SURGEONS STRL SZ 6.5 (GLOVE) ×1
GLOVE BIOGEL PI IND STRL 6.5 (GLOVE) ×1 IMPLANT
GLOVE BIOGEL PI INDICATOR 6.5 (GLOVE) ×2
GLOVE SURG SIGNA 7.5 PF LTX (GLOVE) ×3 IMPLANT
GOWN STRL REUS W/ TWL LRG LVL3 (GOWN DISPOSABLE) ×3 IMPLANT
GOWN STRL REUS W/TWL LRG LVL3 (GOWN DISPOSABLE) ×6
NS IRRIG 1000ML POUR BTL (IV SOLUTION) ×3 IMPLANT
PACK BASIN DAY SURGERY FS (CUSTOM PROCEDURE TRAY) ×3 IMPLANT
POUCH SPECIMEN RETRIEVAL 10MM (ENDOMECHANICALS) ×3 IMPLANT
SCISSORS LAP 5X35 DISP (ENDOMECHANICALS) IMPLANT
SET CHOLANGIOGRAPH 5 50 .035 (SET/KITS/TRAYS/PACK) IMPLANT
SET IRRIG TUBING LAPAROSCOPIC (IRRIGATION / IRRIGATOR) ×3 IMPLANT
SLEEVE ENDOPATH XCEL 5M (ENDOMECHANICALS) ×6 IMPLANT
SLEEVE SCD COMPRESS KNEE MED (MISCELLANEOUS) ×3 IMPLANT
SPECIMEN JAR SMALL (MISCELLANEOUS) ×3 IMPLANT
SUT MON AB 4-0 PC3 18 (SUTURE) ×3 IMPLANT
TOWEL OR 17X24 6PK STRL BLUE (TOWEL DISPOSABLE) ×3 IMPLANT
TRAY LAPAROSCOPIC (CUSTOM PROCEDURE TRAY) ×3 IMPLANT
TROCAR XCEL BLUNT TIP 100MML (ENDOMECHANICALS) ×3 IMPLANT
TROCAR XCEL NON-BLD 5MMX100MML (ENDOMECHANICALS) ×3 IMPLANT
TUBE CONNECTING 20'X1/4 (TUBING) ×1
TUBE CONNECTING 20X1/4 (TUBING) ×2 IMPLANT
TUBING INSUFFLATION (TUBING) ×3 IMPLANT

## 2017-07-07 NOTE — Op Note (Signed)
Laparoscopic Cholecystectomy Procedure Note  Indications: This patient presents with symptomatic gallbladder disease and will undergo laparoscopic cholecystectomy.  Pre-operative Diagnosis: symptomatic cholelithiasis  Post-operative Diagnosis: Same  Surgeon: Abigail MiyamotoBLACKMAN,Myan Locatelli A   Assistants: 0  Anesthesia: General endotracheal anesthesia  ASA Class: 1  Procedure Details  The patient was seen again in the Holding Room. The risks, benefits, complications, treatment options, and expected outcomes were discussed with the patient. The possibilities of reaction to medication, pulmonary aspiration, perforation of viscus, bleeding, recurrent infection, finding a normal gallbladder, the need for additional procedures, failure to diagnose a condition, the possible need to convert to an open procedure, and creating a complication requiring transfusion or operation were discussed with the patient. The likelihood of improving the patient's symptoms with return to their baseline status is good.  The patient and/or family concurred with the proposed plan, giving informed consent. The site of surgery properly noted. The patient was taken to Operating Room, identified as Sierra ChyleMarlen M Boggio and the procedure verified as Laparoscopic Cholecystectomy with Intraoperative Cholangiogram. A Time Out was held and the above information confirmed.  Prior to the induction of general anesthesia, antibiotic prophylaxis was administered. General endotracheal anesthesia was then administered and tolerated well. After the induction, the abdomen was prepped with Chloraprep and draped in sterile fashion. The patient was positioned in the supine position.  Local anesthetic agent was injected into the skin near the umbilicus and an incision made. We dissected down to the abdominal fascia with blunt dissection.  The fascia was incised vertically and we entered the peritoneal cavity bluntly.  A pursestring suture of 0-Vicryl was placed  around the fascial opening.  The Hasson cannula was inserted and secured with the stay suture.  Pneumoperitoneum was then created with CO2 and tolerated well without any adverse changes in the patient's vital signs. A 5-mm port was placed in the subxiphoid position.  Two 5-mm ports were placed in the right upper quadrant. All skin incisions were infiltrated with a local anesthetic agent before making the incision and placing the trocars.   We positioned the patient in reverse Trendelenburg, tilted slightly to the patient's left.  The gallbladder was identified, the fundus grasped and retracted cephalad. The gallbladder was chronically thick walled in appearance consistent with chronic cholecystitAdhesions were lysed bluntly and with the electrocautery where indicated, taking care not to injure any adjacent organs or viscus. The infundibulum was grasped and retracted laterally, exposing the peritoneum overlying the triangle of Calot. This was then divided and exposed in a blunt fashion. The cystic duct was clearly identified and bluntly dissected circumferentially. A critical view of the cystic duct and cystic artery was obtained.  The cystic duct was then ligated with clips and divided. The cystic artery was, dissected free, ligated with clips and divided as well.   The gallbladder was dissected from the liver bed in retrograde fashion with the electrocautery. The gallbladder was removed and placed in an Endocatch sac. The liver bed was irrigated and inspected. Hemostasis was achieved with the electrocautery. Copious irrigation was utilized and was repeatedly aspirated until clear.  The gallbladder and Endocatch sac were then removed through the umbilical port site.    I then evaluated the pelvis. I could see both ovaries and uterus. There appeared to be a couple of small cysts on the left fallopian tube but I saw no other gross findings of endometriosis. There was some omentum adhered to the lower midline  which I left in place. There were no adhesions to  the bowel.  I then removed the trocar the umbilicus.  The pursestring suture was used to close the umbilical fascia.    We again inspected the right upper quadrant for hemostasis.  Pneumoperitoneum was released as we removed the trocars.  4-0 Monocryl was used to close the skin.   Benzoin, steri-strips, and clean dressings were applied. The patient was then extubated and brought to the recovery room in stable condition. Instrument, sponge, and needle counts were correct at closure and at the conclusion of the case.   Findings: Cholecystitis with Cholelithiasis  Estimated Blood Loss: Minimal         Drains: 0         Specimens: Gallbladder           Complications: None; patient tolerated the procedure well.         Disposition: PACU - hemodynamically stable.         Condition: stable

## 2017-07-07 NOTE — Transfer of Care (Signed)
Immediate Anesthesia Transfer of Care Note  Patient: Sierra Hodge  Procedure(s) Performed: LAPAROSCOPIC CHOLECYSTECTOMY (N/A Abdomen)  Patient Location: PACU  Anesthesia Type:General  Level of Consciousness: awake, alert  and oriented  Airway & Oxygen Therapy: Patient Spontanous Breathing and Patient connected to face mask oxygen  Post-op Assessment: Report given to RN and Post -op Vital signs reviewed and stable  Post vital signs: Reviewed and stable  Last Vitals:  Vitals:   07/07/17 0634  BP: (!) 95/56  Pulse: 62  Resp: 16  Temp: 36.8 C  SpO2: 100%    Last Pain:  Vitals:   07/07/17 0634  TempSrc: Oral      Patients Stated Pain Goal: 0 (07/07/17 [id number])  Complications: No apparent anesthesia complications

## 2017-07-07 NOTE — Anesthesia Postprocedure Evaluation (Signed)
Anesthesia Post Note  Patient: Alita ChyleMarlen M Gassert  Procedure(s) Performed: LAPAROSCOPIC CHOLECYSTECTOMY (N/A Abdomen)     Patient location during evaluation: PACU Anesthesia Type: General Level of consciousness: awake and alert Pain management: pain level controlled Vital Signs Assessment: post-procedure vital signs reviewed and stable Respiratory status: spontaneous breathing, nonlabored ventilation, respiratory function stable and patient connected to nasal cannula oxygen Cardiovascular status: blood pressure returned to baseline and stable Postop Assessment: no apparent nausea or vomiting Anesthetic complications: no    Last Vitals:  Vitals:   07/07/17 0915 07/07/17 0945  BP: 111/64 (!) 107/54  Pulse: 71 64  Resp: 19 16  Temp:  36.8 C  SpO2: 98% 100%    Last Pain:  Vitals:   07/07/17 0945  TempSrc:   PainSc: 5                  Phillips Groutarignan, Codee Tutson

## 2017-07-07 NOTE — Anesthesia Preprocedure Evaluation (Signed)
Anesthesia Evaluation  Patient identified by MRN, date of birth, ID band Patient awake    Reviewed: Allergy & Precautions, NPO status , Patient's Chart, lab work & pertinent test results  Airway Mallampati: II  TM Distance: >3 FB Neck ROM: Full    Dental no notable dental hx.    Pulmonary neg pulmonary ROS,    Pulmonary exam normal breath sounds clear to auscultation       Cardiovascular negative cardio ROS Normal cardiovascular exam Rhythm:Regular Rate:Normal     Neuro/Psych negative neurological ROS  negative psych ROS   GI/Hepatic negative GI ROS, Neg liver ROS,   Endo/Other  negative endocrine ROS  Renal/GU negative Renal ROS  negative genitourinary   Musculoskeletal negative musculoskeletal ROS (+)   Abdominal   Peds negative pediatric ROS (+)  Hematology negative hematology ROS (+)   Anesthesia Other Findings   Reproductive/Obstetrics negative OB ROS                             Anesthesia Physical Anesthesia Plan  ASA: II  Anesthesia Plan: General   Post-op Pain Management:    Induction: Intravenous  PONV Risk Score and Plan: 3 and Ondansetron, Dexamethasone, Midazolam and Propofol infusion  Airway Management Planned: Oral ETT  Additional Equipment:   Intra-op Plan:   Post-operative Plan: Extubation in OR  Informed Consent: I have reviewed the patients History and Physical, chart, labs and discussed the procedure including the risks, benefits and alternatives for the proposed anesthesia with the patient or authorized representative who has indicated his/her understanding and acceptance.   Dental advisory given  Plan Discussed with: CRNA  Anesthesia Plan Comments:         Anesthesia Quick Evaluation

## 2017-07-07 NOTE — Interval H&P Note (Signed)
History and Physical Interval Note: no change in H and P  07/07/2017 6:58 AM  Sierra Hodge  has presented today for surgery, with the diagnosis of symptomatic gallstones  The various methods of treatment have been discussed with the patient and family. After consideration of risks, benefits and other options for treatment, the patient has consented to  Procedure(s): LAPAROSCOPIC CHOLECYSTECTOMY (N/A) as a surgical intervention .  The patient's history has been reviewed, patient examined, no change in status, stable for surgery.  I have reviewed the patient's chart and labs.  Questions were answered to the patient's satisfaction.     [name] A

## 2017-07-07 NOTE — Anesthesia Procedure Notes (Signed)
Procedure Name: Intubation Date/Time: 07/07/2017 7:37 AM Performed by: Pearson GrippeOBERTSON, Fronia Depass M Pre-anesthesia Checklist: Patient identified, Emergency Drugs available, Suction available and Patient being monitored Patient Re-evaluated:Patient Re-evaluated prior to induction Oxygen Delivery Method: Circle system utilized Preoxygenation: Pre-oxygenation with 100% oxygen Induction Type: IV induction Ventilation: Mask ventilation without difficulty Laryngoscope Size: Miller and 2 Grade View: Grade II Tube type: Oral Tube size: 7.0 mm Number of attempts: 1 Airway Equipment and Method: Stylet and Oral airway Placement Confirmation: ETT inserted through vocal cords under direct vision,  positive ETCO2 and breath sounds checked- equal and bilateral Secured at: 20 cm Tube secured with: Tape Dental Injury: Teeth and Oropharynx as per pre-operative assessment

## 2017-07-07 NOTE — Discharge Instructions (Signed)
[facility] ______[facility] LAPAROSCOPIC SURGERY: POST OP INSTRUCTIONS Always review your discharge instruction sheet given to you by the facility where your surgery was performed. IF YOU HAVE DISABILITY OR FAMILY LEAVE FORMS, YOU MUST BRING THEM TO THE OFFICE FOR PROCESSING.   DO NOT GIVE THEM TO YOUR DOCTOR.  1. A prescription for pain medication may be given to you upon discharge.  Take your pain medication as prescribed, if needed.  If narcotic pain medicine is not needed, then you may take acetaminophen (Tylenol) or ibuprofen (Advil) as needed. 2. Take your usually prescribed medications unless otherwise directed. 3. If you need a refill on your pain medication, please contact your pharmacy.  They will contact our office to request authorization. Prescriptions will not be filled after 5pm or on week-ends. 4. You should follow a light diet the first few days after arrival home, such as soup and crackers, etc.  Be sure to include lots of fluids daily. 5. Most patients will experience some swelling and bruising in the area of the incisions.  Ice packs will help.  Swelling and bruising can take several days to resolve.  6. It is common to experience some constipation if taking pain medication after surgery.  Increasing fluid intake and taking a stool softener (such as Colace) will usually help or prevent this problem from occurring.  A mild laxative (Milk of Magnesia or Miralax) should be taken according to package instructions if there are no bowel movements after 48 hours. 7. Unless discharge instructions indicate otherwise, you may remove your bandages 24-48 hours after surgery, and you may shower at that time.  You may have steri-strips (small skin tapes) in place directly over the incision.  These strips should be left on the skin for 7-10 days.  If your surgeon used skin glue on the incision, you may shower in 24 hours.  The glue will flake off over the next 2-3 weeks.  Any sutures or  staples will be removed at the office during your follow-up visit. 8. ACTIVITIES:  You may resume regular (light) daily activities beginning the next day--such as daily self-care, walking, climbing stairs--gradually increasing activities as tolerated.  You may have sexual intercourse when it is comfortable.  Refrain from any heavy lifting or straining until approved by your doctor. a. You may drive when you are no longer taking prescription pain medication, you can comfortably wear a seatbelt, and you can safely maneuver your car and apply brakes. b. RETURN TO WORK:  __________________________________________________________ 9. You should see your doctor in the office for a follow-up appointment approximately 2-3 weeks after your surgery.  Make sure that you call for this appointment within a day or two after you arrive home to insure a convenient appointment time. 10. OTHER INSTRUCTIONS:OK TO SHOWER STARTING TOMORROW 11. ICE PACK, IBUPROFEN, TYLENOL FOR PAIN 12. NO LIFTING OVER 15 POUNDS FOR 2 WEEKS __________________________________________________________________________________________________________________________ __________________________________________________________________________________________________________________________ WHEN TO CALL YOUR DOCTOR: 1. Fever over 101.0 2. Inability to urinate 3. Continued bleeding from incision. 4. Increased pain, redness, or drainage from the incision. 5. Increasing abdominal pain  The clinic staff is available to answer your questions during regular business hours.  Please dont hesitate to call and ask to speak to one of the nurses for clinical concerns.  If you have a medical emergency, go to the nearest emergency room or call 911.  [facility] is always on call at the hospital. [address], [address], [place], [place]  [address] ? [address] [address], [place], [place]   [address] [phone] ? [phone] ? FAX  [phone] Web site: [email or web address]    Post Anesthesia Home Care Instructions  Activity: Get plenty of rest for the remainder of the day. A responsible individual must stay with you for 24 hours following the procedure.  For the next 24 hours, DO NOT: -Drive a car -Advertising copywriter -Drink alcoholic beverages -Take any medication unless instructed by your physician -Make any legal decisions or sign important papers.  Meals: Start with liquid foods such as gelatin or soup. Progress to regular foods as tolerated. Avoid greasy, spicy, heavy foods. If nausea and/or vomiting occur, drink only clear liquids until the nausea and/or vomiting subsides. Call your physician if vomiting continues.  Special Instructions/Symptoms: Your throat may feel dry or sore from the anesthesia or the breathing tube placed in your throat during surgery. If this causes discomfort, gargle with warm salt water. The discomfort should disappear within 24 hours.  If you had a scopolamine patch placed behind your ear for the management of post- operative nausea and/or vomiting:  1. The medication in the patch is effective for 72 hours, after which it should be removed.  Wrap patch in a tissue and discard in the trash. Wash hands thoroughly with soap and water. 2. You may remove the patch earlier than 72 hours if you experience unpleasant side effects which may include dry mouth, dizziness or visual disturbances. 3. Avoid touching the patch. Wash your hands with soap and water after contact with the patch.

## 2017-07-08 ENCOUNTER — Encounter (HOSPITAL_BASED_OUTPATIENT_CLINIC_OR_DEPARTMENT_OTHER): Payer: Self-pay | Admitting: Surgery

## 2017-08-03 DIAGNOSIS — N803 Endometriosis of pelvic peritoneum: Secondary | ICD-10-CM | POA: Diagnosis not present

## 2017-08-03 DIAGNOSIS — R319 Hematuria, unspecified: Secondary | ICD-10-CM | POA: Diagnosis not present

## 2017-08-03 DIAGNOSIS — L409 Psoriasis, unspecified: Secondary | ICD-10-CM | POA: Diagnosis not present

## 2017-08-03 DIAGNOSIS — N898 Other specified noninflammatory disorders of vagina: Secondary | ICD-10-CM | POA: Diagnosis not present

## 2017-08-03 DIAGNOSIS — R895 Abnormal microbiological findings in specimens from other organs, systems and tissues: Secondary | ICD-10-CM | POA: Diagnosis not present

## 2017-08-03 DIAGNOSIS — R102 Pelvic and perineal pain: Secondary | ICD-10-CM | POA: Diagnosis not present

## 2017-08-08 DIAGNOSIS — R102 Pelvic and perineal pain: Secondary | ICD-10-CM | POA: Diagnosis not present

## 2017-08-09 ENCOUNTER — Other Ambulatory Visit: Payer: Self-pay | Admitting: Internal Medicine

## 2017-09-05 ENCOUNTER — Telehealth: Payer: Self-pay | Admitting: *Deleted

## 2017-09-05 ENCOUNTER — Ambulatory Visit: Payer: BLUE CROSS/BLUE SHIELD | Admitting: Neurology

## 2017-09-05 ENCOUNTER — Encounter: Payer: Self-pay | Admitting: Neurology

## 2017-09-05 VITALS — BP 97/57 | HR 72 | Ht 59.0 in | Wt 141.0 lb

## 2017-09-05 DIAGNOSIS — R51 Headache with orthostatic component, not elsewhere classified: Secondary | ICD-10-CM

## 2017-09-05 DIAGNOSIS — J328 Other chronic sinusitis: Secondary | ICD-10-CM | POA: Diagnosis not present

## 2017-09-05 DIAGNOSIS — H539 Unspecified visual disturbance: Secondary | ICD-10-CM | POA: Diagnosis not present

## 2017-09-05 DIAGNOSIS — R519 Headache, unspecified: Secondary | ICD-10-CM

## 2017-09-05 DIAGNOSIS — G43009 Migraine without aura, not intractable, without status migrainosus: Secondary | ICD-10-CM

## 2017-09-05 MED ORDER — ALPRAZOLAM 0.5 MG PO TABS: ORAL_TABLET | ORAL | 0 refills | Status: DC

## 2017-09-05 MED ORDER — SUMATRIPTAN SUCCINATE 100 MG PO TABS: 100.0000 mg | ORAL_TABLET | Freq: Once | ORAL | 12 refills | Status: DC | PRN

## 2017-09-05 NOTE — Patient Instructions (Addendum)
Sumatriptan: Please take one tablet at the onset of your headache. If it does not improve the symptoms please take one additional tablet. Do not take more then 2 tablets in 24hrs. Do not take use more then 2 to 3 times in a week.  MRI brain w/wo contrast   Migraine Headache A migraine headache is an intense, throbbing pain on one side or both sides of the head. Migraines may also cause other symptoms, such as nausea, vomiting, and sensitivity to light and noise. What are the causes? Doing or taking certain things may also trigger migraines, such as:  Alcohol.  Smoking.  Medicines, such as: ? Medicine used to treat chest pain (nitroglycerine). ? Birth control pills. ? Estrogen pills. ? Certain blood pressure medicines.  Aged cheeses, chocolate, or caffeine.  Foods or drinks that contain nitrates, glutamate, aspartame, or tyramine.  Physical activity.  Other things that may trigger a migraine include:  Menstruation.  Pregnancy.  Hunger.  Stress, lack of sleep, too much sleep, or fatigue.  Weather changes.  What increases the risk? The following factors may make you more likely to experience migraine headaches:  Age. Risk increases with age.  Family history of migraine headaches.  Being Caucasian.  Depression and anxiety.  Obesity.  Being a woman.  Having a hole in the heart (patent foramen ovale) or other heart problems.  What are the signs or symptoms? The main symptom of this condition is pulsating or throbbing pain. Pain may:  Happen in any area of the head, such as on one side or both sides.  Interfere with daily activities.  Get worse with physical activity.  Get worse with exposure to bright lights or loud noises.  Other symptoms may include:  Nausea.  Vomiting.  Dizziness.  General sensitivity to bright lights, loud noises, or smells.  Before you get a migraine, you may get warning signs that a migraine is developing (aura). An aura may  include:  Seeing flashing lights or having blind spots.  Seeing bright spots, halos, or zigzag lines.  Having tunnel vision or blurred vision.  Having numbness or a tingling feeling.  Having trouble talking.  Having muscle weakness.  How is this diagnosed? A migraine headache can be diagnosed based on:  Your symptoms.  A physical exam.  Tests, such as CT scan or MRI of the head. These imaging tests can help rule out other causes of headaches.  Taking fluid from the spine (lumbar puncture) and analyzing it (cerebrospinal fluid analysis, or CSF analysis).  How is this treated? A migraine headache is usually treated with medicines that:  Relieve pain.  Relieve nausea.  Prevent migraines from coming back.  Treatment may also include:  Acupuncture.  Lifestyle changes like avoiding foods that trigger migraines.  Follow these instructions at home: Medicines  Take over-the-counter and prescription medicines only as told by your health care provider.  Do not drive or use heavy machinery while taking prescription pain medicine.  To prevent or treat constipation while you are taking prescription pain medicine, your health care provider may recommend that you: ? Drink enough fluid to keep your urine clear or pale yellow. ? Take over-the-counter or prescription medicines. ? Eat foods that are high in fiber, such as fresh fruits and vegetables, whole grains, and beans. ? Limit foods that are high in fat and processed sugars, such as fried and sweet foods. Lifestyle  Avoid alcohol use.  Do not use any products that contain nicotine or tobacco, such as  cigarettes and e-cigarettes. If you need help quitting, ask your health care provider.  Get at least 8 hours of sleep every night.  Limit your stress. General instructions   Keep a journal to find out what may trigger your migraine headaches. For example, write down: ? What you eat and drink. ? How much sleep you  get. ? Any change to your diet or medicines.  If you have a migraine: ? Avoid things that make your symptoms worse, such as bright lights. ? It may help to lie down in a dark, quiet room. ? Do not drive or use heavy machinery. ? Ask your health care provider what activities are safe for you while you are experiencing symptoms.  Keep all follow-up visits as told by your health care provider. This is important. Contact a health care provider if:  You develop symptoms that are different or more severe than your usual migraine symptoms. Get help right away if:  Your migraine becomes severe.  You have a fever.  You have a stiff neck.  You have vision loss.  Your muscles feel weak or like you cannot control them.  You start to lose your balance often.  You develop trouble walking.  You faint. This information is not intended to replace advice given to you by your health care provider. Make sure you discuss any questions you have with your health care provider. Document Released: 09/06/2005 Document Revised: 03/26/2016 Document Reviewed: 02/23/2016 Elsevier Interactive Patient Education  2017 [employer]   Sumatriptan tablets What is this medicine? SUMATRIPTAN (soo ma TRIP tan) is used to treat migraines with or without aura. An aura is a strange feeling or visual disturbance that warns you of an attack. It is not used to prevent migraines. This medicine may be used for other purposes; ask your health care provider or pharmacist if you have questions. COMMON BRAND NAME(S): Imitrex, Migraine Pack What should I tell my health care provider before I take this medicine? They need to know if you have any of these conditions: -circulation problems in fingers and toes -diabetes -heart disease -high blood pressure -high cholesterol -history of irregular heartbeat -history of stroke -kidney disease -liver disease -postmenopausal or surgical removal of uterus and  ovaries -seizures -smoke tobacco -stomach or intestine problems -an unusual or allergic reaction to sumatriptan, other medicines, foods, dyes, or preservatives -pregnant or trying to get pregnant -breast-feeding How should I use this medicine? Take this medicine by mouth with a glass of water. Follow the directions on the prescription label. This medicine is taken at the first symptoms of a migraine. It is not for everyday use. If your migraine headache returns after one dose, you can take another dose as directed. You must leave at least 2 hours between doses, and do not take more than 100 mg as a single dose. Do not take more than 200 mg total in any 24 hour period. If there is no improvement at all after the first dose, do not take a second dose without talking to your doctor or health care professional. Do not take your medicine more often than directed. Talk to your pediatrician regarding the use of this medicine in children. Special care may be needed. Overdosage: If you think you have taken too much of this medicine contact a poison control center or emergency room at once. NOTE: This medicine is only for you. Do not share this medicine with others. What if I miss a dose? This does not apply; this  medicine is not for regular use. What may interact with this medicine? Do not take this medicine with any of the following medicines: -cocaine -ergot alkaloids like dihydroergotamine, ergonovine, ergotamine, methylergonovine -feverfew -MAOIs like Carbex, Eldepryl, Marplan, Nardil, and Parnate -other medicines for migraine headache like almotriptan, eletriptan, frovatriptan, naratriptan, rizatriptan, zolmitriptan -tryptophan This medicine may also interact with the following medications: -certain medicines for depression, anxiety, or psychotic disturbances This list may not describe all possible interactions. Give your health care provider a list of all the medicines, herbs, non-prescription  drugs, or dietary supplements you use. Also tell them if you smoke, drink alcohol, or use illegal drugs. Some items may interact with your medicine. What should I watch for while using this medicine? Only take this medicine for a migraine headache. Take it if you get warning symptoms or at the start of a migraine attack. It is not for regular use to prevent migraine attacks. You may get drowsy or dizzy. Do not drive, use machinery, or do anything that needs mental alertness until you know how this medicine affects you. To reduce dizzy or fainting spells, do not sit or stand up quickly, especially if you are an older patient. Alcohol can increase drowsiness, dizziness and flushing. Avoid alcoholic drinks. Smoking cigarettes may increase the risk of heart-related side effects from using this medicine. If you take migraine medicines for 10 or more days a month, your migraines may get worse. Keep a diary of headache days and medicine use. Contact your healthcare professional if your migraine attacks occur more frequently. What side effects may I notice from receiving this medicine? Side effects that you should report to your doctor or health care professional as soon as possible: -allergic reactions like skin rash, itching or hives, swelling of the face, lips, or tongue -bloody or watery diarrhea -hallucination, loss of contact with reality -pain, tingling, numbness in the face, hands, or feet -seizures -signs and symptoms of a blood clot such as breathing problems; changes in vision; chest pain; severe, sudden headache; pain, swelling, warmth in the leg; trouble speaking; sudden numbness or weakness of the face, arm, or leg -signs and symptoms of a dangerous change in heartbeat or heart rhythm like chest pain; dizziness; fast or irregular heartbeat; palpitations, feeling faint or lightheaded; falls; breathing problems -signs and symptoms of a stroke like changes in vision; confusion; trouble speaking or  understanding; severe headaches; sudden numbness or weakness of the face, arm, or leg; trouble walking; dizziness; loss of balance or coordination -stomach pain Side effects that usually do not require medical attention (report to your doctor or health care professional if they continue or are bothersome): -changes in taste -facial flushing -headache -muscle cramps -muscle pain -nausea, vomiting -weak or tired This list may not describe all possible side effects. Call your doctor for medical advice about side effects. You may report side effects to FDA at 1-800-FDA-1088. Where should I keep my medicine? Keep out of the reach of children. Store at room temperature between 2 and 30 degrees C (36 and 86 degrees F). Throw away any unused medicine after the expiration date. NOTE: This sheet is a summary. It may not cover all possible information. If you have questions about this medicine, talk to your doctor, pharmacist, or health care provider.  2018 Elsevier/Gold Standard (2015-10-09 12:38:23)

## 2017-09-05 NOTE — Telephone Encounter (Signed)
Xanax prescription faxed to pharmacy. Received a receipt of confirmation. Called and let patient know. She stated that she had changed her mind because she did not have anyone to take her to her MRI. She wanted the prescription cancelled. She verbalized appreciation and apologized for any trouble.    Called and LVM at the pharmacy to cancel prescription.

## 2017-09-05 NOTE — Progress Notes (Signed)
[facility]    Provider:  Dr Lucia GaskinsAhern Referring Provider: Wanda PlumpPaz, Jose E, MD Primary Care Physician:  Wanda PlumpPaz, Jose E, MD  CC:  headaches  HPI:  Sierra Hodge is a 41 y.o. female here as a referral from Dr. Drue NovelPaz for headaches. She feels like she has headaches and thought it was sinus related. She has had multiple sinus infections and was treated with multiple rounds of antibitoics. Headaches worsening for the last 2 years. They "come and go". Ibuprofen helps. The headache is on the top of the head. She takes ibuprofen 8x a month. Headaches can be severe, she has temple pain pulsating, eye twitches, vision worsens, she has light sensitivity, she has had 2 severe headaches and one lasted up to a week. She had associated dizziness. Headache is positional. She has ear pain. Mohter has migraines.  Sister has migraines too. She currently has sinus pain and congestion. +vision changes. No aura. No medication overuse. No other focal neurologic deficits, associated symptoms, inciting events or modifiable factors.  Reviewed notes, labs and imaging from outside physicians, which showed:  TSH nml  CT maxillofacial 06/27/2017: personally reviewed and agree with the following:  1. Symmetric nasal cavity mucosal thickening raising the possibility of Rhinitis. Only mild bilateral maxillary sinus mucosal thickening, and the remaining paranasal sinuses, the middle ears and mastoids are clear.  2. No significant dental disease identified. No acute osseous abnormality.  3. Negative visualized noncontrast face soft tissues and brain parenchyma.  Review of Systems: Patient complains of symptoms per HPI as well as the following symptoms: blurred vision, headache, dizziness, joint pain, allergies. Pertinent negatives and positives per HPI. All others negative.   Social History   Socioeconomic History  . Marital status: Married    Spouse name: Not on file  . Number of children: 2  . Years of  education: 9216  . Highest education level: Not on file  Social Needs  . Financial resource strain: Not on file  . Food insecurity - worry: Not on file  . Food insecurity - inability: Not on file  . Transportation needs - medical: Not on file  . Transportation needs - non-medical: Not on file  Occupational History  . Occupation: Medical interpreter    Comment: Spanish  Tobacco Use  . Smoking status: Never Smoker  . Smokeless tobacco: Never Used  Substance and Sexual Activity  . Alcohol use: Yes    Alcohol/week: 0.6 oz    Types: 1 Glasses of wine per week    Comment: rarely   . Drug use: No  . Sexual activity: Yes    Partners: Male    Birth control/protection: Surgical    Comment: BTL   Other Topics Concern  . Not on file  Social History Narrative   Married    Employed as a Research officer, trade unionpanish interpreter,  mainly medical   From [place] ; 2 daughters 2008, 2010         Family History  Problem Relation Age of Onset  . Hyperlipidemia Mother   . Migraines Mother   . Heart disease Other        paternal family  . Diabetes Paternal Uncle   . Hyperlipidemia Sister   . Colon cancer Neg Hx   . Breast cancer Neg Hx     Past Medical History:  Diagnosis Date  . Chronic interstitial cystitis   . Complication of anesthesia    hard to wake after anesthesia  . Endometriosis   . Headache   .  Hyperlipidemia   . IUD (intrauterine device) in place    Mirena, Dr. Pennie Rushing  . Other psoriasis     Past Surgical History:  Procedure Laterality Date  . CESAREAN SECTION     2008, 2010  . CHOLECYSTECTOMY N/A 07/07/2017   Procedure: LAPAROSCOPIC CHOLECYSTECTOMY;  Surgeon: Abigail Miyamoto, MD;  Location: [facility];  Service: General;  Laterality: N/A;  . CYSTO WITH HYDRODISTENSION  03/29/2012   Procedure: CYSTOSCOPY/HYDRODISTENSION;  Surgeon: Martina Sinner, MD;  Location: [facility];  Service: Urology;  Laterality: N/A;  with Instillation of Marcaine and Pyridium   .  LAPAROSCOPY  03/29/2012   Procedure: LAPAROSCOPY OPERATIVE;  Surgeon: Hal Morales, MD;  Location: [facility];  Service: Gynecology;  Laterality: N/A;  with Fulguration of Endometriosis and Peritoneal Biopsy  . TONSILLECTOMY    . TUBAL LIGATION  2010  . UMBILICAL HERNIA REPAIR  10/2010    Current Outpatient Medications  Medication Sig Dispense Refill  . cetirizine (ZYRTEC) 10 MG tablet Take 10 mg by mouth daily.    . clobetasol ointment (TEMOVATE) 0.05 % Apply topically daily as needed. 60 g 5  . fluticasone (FLONASE) 50 MCG/ACT nasal spray Place 2 sprays into both nostrils daily as needed for allergies or rhinitis. 16 g 5  . ketorolac (TORADOL) 10 MG tablet Take 10 mg by mouth every 6 (six) hours as needed.    Marland Kitchen levonorgestrel (MIRENA) 20 MCG/24HR IUD 1 each by Intrauterine route once.    . pentosan polysulfate (ELMIRON) 100 MG capsule Take 100 mg by mouth 2 (two) times daily. rx per gyn    . ALPRAZolam (XANAX) 0.5 MG tablet Take 1-2 tabs 30-60 minutes before MRI. May take one additional if needed right before MRI. Do not drive. 3 tablet 0  . SUMAtriptan (IMITREX) 100 MG tablet Take 1 tablet (100 mg total) by mouth once as needed for up to 1 dose. May repeat in 2 hours if headache persists or recurs. 10 tablet 12  . traMADol (ULTRAM) 50 MG tablet Take 1-2 tablets (50-100 mg total) by mouth every 6 (six) hours as needed. (Patient not taking: Reported on 09/05/2017) 30 tablet 0   No current facility-administered medications for this visit.     Allergies as of 09/05/2017 - Review Complete 09/05/2017  Allergen Reaction Noted  . Morphine and related Itching 10/18/2011  . Meloxicam Other (See Comments)     Vitals: BP (!) 97/57   Pulse 72   Ht 4\' 11"  (1.499 m)   Wt 141 lb (64 kg)   BMI 28.48 kg/m  Last Weight:  Wt Readings from Last 1 Encounters:  09/05/17 141 lb (64 kg)   Last Height:   Ht Readings from Last 1 Encounters:  09/05/17 4\' 11"  (1.499 m)    Physical  exam: Exam: Gen: NAD, conversant, well nourised, well groomed                     CV: RRR, no MRG. No Carotid Bruits. No peripheral edema, warm, nontender Eyes: Conjunctivae clear without exudates or hemorrhage  Neuro: Detailed Neurologic Exam  Speech:    Speech is normal; fluent and spontaneous with normal comprehension.  Cognition:    The patient is oriented to person, place, and time;     recent and remote memory intact;     language fluent;     normal attention, concentration,     fund of knowledge Cranial Nerves:    The pupils are equal, round,  and reactive to light. The fundi are normal and spontaneous venous pulsations are present. Visual fields are full to finger confrontation. Extraocular movements are intact. Trigeminal sensation is intact and the muscles of mastication are normal. The face is symmetric. The palate elevates in the midline. Hearing intact. Voice is normal. Shoulder shrug is normal. The tongue has normal motion without fasciculations.   Coordination:    Normal finger to nose and heel to shin. Normal rapid alternating movements.   Gait:    Heel-toe and tandem gait are normal.   Motor Observation:    No asymmetry, no atrophy, and no involuntary movements noted. Tone:    Normal muscle tone.    Posture:    Posture is normal. normal erect    Strength:    Strength is V/V in the upper and lower limbs.      Sensation: intact to LT     Reflex Exam:  DTR's:    Deep tendon reflexes in the upper and lower extremities are normal bilaterally.   Toes:    The toes are downgoing bilaterally.   Clonus:    Clonus is absent.      Assessment/Plan:  41 year old patient with migraines. However the headaches are positional with vision changes so need to have an MRI of the brain to evaluate for other intracranial causes.  Acute management: imitrex Recommend ENT:  Dr. Haroldine Lawsrossley MRI brain w/wo contrast as above  Orders Placed This Encounter  Procedures  . MR  BRAIN W WO CONTRAST  . Ambulatory referral to ENT     Discussed: To prevent or relieve headaches, try the following: Cool Compress. Lie down and place a cool compress on your head.  Avoid headache triggers. If certain foods or odors seem to have triggered your migraines in the past, avoid them. A headache diary might help you identify triggers.  Include physical activity in your daily routine. Try a daily walk or other moderate aerobic exercise.  Manage stress. Find healthy ways to cope with the stressors, such as delegating tasks on your to-do list.  Practice relaxation techniques. Try deep breathing, yoga, massage and visualization.  Eat regularly. Eating regularly scheduled meals and maintaining a healthy diet might help prevent headaches. Also, drink plenty of fluids.  Follow a regular sleep schedule. Sleep deprivation might contribute to headaches Consider biofeedback. With this mind-body technique, you learn to control certain bodily functions - such as muscle tension, heart rate and blood pressure - to prevent headaches or reduce headache pain.    Proceed to emergency room if you experience new or worsening symptoms or symptoms do not resolve, if you have new neurologic symptoms or if headache is severe, or for any concerning symptom.   Provided education and documentation from American headache Society toolbox including articles on: chronic migraine medication overuse headache, chronic migraines, prevention of migraines, behavioral and other nonpharmacologic treatments for headache.     Naomie DeanAntonia Christopher Glasscock, MD  [facility] [address] [place], [place] [id number]  Phone [phone] Fax [phone]

## 2017-09-07 ENCOUNTER — Other Ambulatory Visit: Payer: BLUE CROSS/BLUE SHIELD

## 2017-09-08 DIAGNOSIS — J301 Allergic rhinitis due to pollen: Secondary | ICD-10-CM | POA: Diagnosis not present

## 2017-09-08 DIAGNOSIS — J040 Acute laryngitis: Secondary | ICD-10-CM | POA: Diagnosis not present

## 2017-09-08 DIAGNOSIS — J320 Chronic maxillary sinusitis: Secondary | ICD-10-CM | POA: Diagnosis not present

## 2017-09-08 DIAGNOSIS — J322 Chronic ethmoidal sinusitis: Secondary | ICD-10-CM | POA: Diagnosis not present

## 2017-09-14 ENCOUNTER — Ambulatory Visit (INDEPENDENT_AMBULATORY_CARE_PROVIDER_SITE_OTHER): Payer: BLUE CROSS/BLUE SHIELD

## 2017-09-14 DIAGNOSIS — R51 Headache with orthostatic component, not elsewhere classified: Secondary | ICD-10-CM

## 2017-09-14 DIAGNOSIS — H539 Unspecified visual disturbance: Secondary | ICD-10-CM | POA: Diagnosis not present

## 2017-09-14 DIAGNOSIS — R519 Headache, unspecified: Secondary | ICD-10-CM

## 2017-09-14 MED ORDER — GADOPENTETATE DIMEGLUMINE 469.01 MG/ML IV SOLN: 13.0000 mL | Freq: Once | INTRAVENOUS | Status: AC | PRN

## 2017-09-16 ENCOUNTER — Telehealth: Payer: Self-pay | Admitting: *Deleted

## 2017-09-16 NOTE — Telephone Encounter (Addendum)
Called and LVM (ok per DPR) informing patient that her MRI brain is normal. This message does not require a call back but if she has any questions, I left the office number. Also included that the office is closed for the new year until Wed Jan 2nd.  ----- Message from Anson FretAntonia B Ahern, MD sent at 09/16/2017  9:15 AM EST ----- Mri brain normal

## 2017-09-28 DIAGNOSIS — J322 Chronic ethmoidal sinusitis: Secondary | ICD-10-CM | POA: Diagnosis not present

## 2017-09-28 DIAGNOSIS — J3081 Allergic rhinitis due to animal (cat) (dog) hair and dander: Secondary | ICD-10-CM | POA: Diagnosis not present

## 2017-09-28 DIAGNOSIS — J320 Chronic maxillary sinusitis: Secondary | ICD-10-CM | POA: Diagnosis not present

## 2017-09-28 DIAGNOSIS — J3089 Other allergic rhinitis: Secondary | ICD-10-CM | POA: Diagnosis not present

## 2017-10-03 ENCOUNTER — Ambulatory Visit: Payer: BLUE CROSS/BLUE SHIELD | Admitting: Medical

## 2017-10-03 ENCOUNTER — Encounter: Payer: Self-pay | Admitting: Medical

## 2017-10-03 VITALS — BP 106/66 | HR 73 | Temp 97.6°F | Resp 16 | Ht 59.0 in | Wt 137.8 lb

## 2017-10-03 DIAGNOSIS — N3011 Interstitial cystitis (chronic) with hematuria: Secondary | ICD-10-CM

## 2017-10-03 DIAGNOSIS — R350 Frequency of micturition: Secondary | ICD-10-CM | POA: Diagnosis not present

## 2017-10-03 DIAGNOSIS — R319 Hematuria, unspecified: Secondary | ICD-10-CM | POA: Diagnosis not present

## 2017-10-03 DIAGNOSIS — M549 Dorsalgia, unspecified: Secondary | ICD-10-CM

## 2017-10-03 LAB — COMPREHENSIVE METABOLIC PANEL
ALBUMIN: 4.5 g/dL (ref 3.5–5.2)
ALK PHOS: 55 U/L (ref 39–117)
ALT: 21 U/L (ref 0–35)
AST: 17 U/L (ref 0–37)
BILIRUBIN TOTAL: 1 mg/dL (ref 0.2–1.2)
BUN: 10 mg/dL (ref 6–23)
CO2: 29 mEq/L (ref 19–32)
Calcium: 9 mg/dL (ref 8.4–10.5)
Chloride: 103 mEq/L (ref 96–112)
Creatinine, Ser: 0.51 mg/dL (ref 0.40–1.20)
GFR: 139.83 mL/min (ref >60.00)
Glucose, Bld: 95 mg/dL (ref 70–99)
Potassium: 3.6 mEq/L (ref 3.5–5.1)
Sodium: 137 mEq/L (ref 135–145)
TOTAL PROTEIN: 7.2 g/dL (ref 6.0–8.3)

## 2017-10-03 LAB — POC URINALSYSI DIPSTICK (AUTOMATED)
BILIRUBIN UA: NEGATIVE
GLUCOSE UA: NEGATIVE
KETONES UA: NEGATIVE
LEUKOCYTES UA: NEGATIVE
Nitrite, UA: NEGATIVE
Protein, UA: NEGATIVE
Spec Grav, UA: >=1.03 — AB (ref 1.010–1.025)
Urobilinogen, UA: NEGATIVE E.U./dL — AB
pH, UA: 6 (ref 5.0–8.0)

## 2017-10-03 MED ORDER — CEFTRIAXONE SODIUM 1 G IJ SOLR
1.0000 g | Freq: Once | INTRAMUSCULAR | Status: AC
  Administered 2017-10-03: 1 g via INTRAMUSCULAR

## 2017-10-03 NOTE — Patient Instructions (Addendum)
You do have recent sinus infection(treated by ENT) and most recently some increased frequency of urination with some pain over your kidneys.  Your urine only shows 1+ blood.  I do want you to continue the doxycycline but we gave you 1 g Rocephin in the office pending the urine culture.  I do want you to continue to take the Diflucan 3 tablet regimen that ENT prescribed.  Make sure you hydrate well.  If you get any back pain radiating to your flank areas then let me know and I would get CT abdomen pelvis to look for kidney stones.  I do not think this is the case presently.  Blood that you have appears to be chronic and likely related to IC or may be urinary tract infection.  No mid lumbar or thoracic pain on exam today.  If you develope mid lumbar or thoracic region pain let me know and would consider getting x-rays.  After discussion, decided to go ahead and refer you to another urologist.  You requested a female urologist and I will try to find one in the area.  Referral would be for IC.  You will need to check your kidney function today and I will put in metabolic panel.  Follow-up in 7 days or as needed.

## 2017-10-03 NOTE — Addendum Note (Signed)
Addended by: Orlene OchRENCE, Stellarose Cerny N on: 10/03/2017 02:35 PM   Modules accepted: Orders

## 2017-10-03 NOTE — Progress Notes (Signed)
Subjective:    Patient ID: Sierra Hodge, female    DOB: 1976-02-28, 42 y.o.   MRN: [id number]  HPI  Pt in some pain in her lower back for 2 weeks ago. She has bilateral cva area pain but also some pain in middle lumbar area pain but on and off.(no lumbar pain presently). She does have IC.(no pain on urination but does have some increased frequency).She reports some faint chills. No diffuse body aches.   She she has mild transient lower back pain standing up her pain feels little better in lower back. But also some upper t-spine area pain as.(however presently no mid lspine or tspine pain)  Pt is being treated for sinus infection recently by ENT. She has been on  Doxycycline since last Thursday. She has 10 days course of doxy.  Hx of some low back pain mid lumbar on review 08-2015.  LMP- pt has mirena.     Review of Systems  HENT: Negative for congestion, dental problem, ear pain, sinus pressure, sinus pain and sore throat.        Less sinus pressure.  Respiratory: Negative for cough, chest tightness, shortness of breath and wheezing.   Cardiovascular: Negative for chest pain and palpitations.  Genitourinary: Positive for frequency. Negative for decreased urine volume, difficulty urinating, dysuria, pelvic pain, urgency, vaginal discharge and vaginal pain.       Pt speculates she might have yeast infection. Maybe vaginal itch.  Musculoskeletal: Negative for back pain, joint swelling and neck stiffness.  Skin: Negative for rash.  Neurological: Negative for dizziness, speech difficulty, weakness, numbness and headaches.  Hematological: Negative for adenopathy. Does not bruise/bleed easily.  Psychiatric/Behavioral: Negative for behavioral problems, confusion and dysphoric mood. The patient is not nervous/anxious.     Past Medical History:  Diagnosis Date  . Chronic interstitial cystitis   . Complication of anesthesia    hard to wake after anesthesia  . Endometriosis   .  Headache   . Hyperlipidemia   . IUD (intrauterine device) in place    Mirena, Dr. Pennie Rushing  . Other psoriasis      Social History   Socioeconomic History  . Marital status: Married    Spouse name: Not on file  . Number of children: 2  . Years of education: 61  . Highest education level: Not on file  Social Needs  . Financial resource strain: Not on file  . Food insecurity - worry: Not on file  . Food insecurity - inability: Not on file  . Transportation needs - medical: Not on file  . Transportation needs - non-medical: Not on file  Occupational History  . Occupation: Medical interpreter    Comment: Spanish  Tobacco Use  . Smoking status: Never Smoker  . Smokeless tobacco: Never Used  Substance and Sexual Activity  . Alcohol use: Yes    Alcohol/week: 0.6 oz    Types: 1 Glasses of wine per week    Comment: rarely   . Drug use: No  . Sexual activity: Yes    Partners: Male    Birth control/protection: Surgical    Comment: BTL   Other Topics Concern  . Not on file  Social History Narrative   Married    Employed as a Research officer, trade union,  mainly medical   From [place] ; 2 daughters 2008, 2010         Past Surgical History:  Procedure Laterality Date  . CESAREAN SECTION     2008, 2010  .  CHOLECYSTECTOMY N/A 07/07/2017   Procedure: LAPAROSCOPIC CHOLECYSTECTOMY;  Surgeon: Abigail MiyamotoBlackman, Douglas, MD;  Location: [facility];  Service: General;  Laterality: N/A;  . CYSTO WITH HYDRODISTENSION  03/29/2012   Procedure: CYSTOSCOPY/HYDRODISTENSION;  Surgeon: Martina SinnerScott A MacDiarmid, MD;  Location: [facility];  Service: Urology;  Laterality: N/A;  with Instillation of Marcaine and Pyridium   . LAPAROSCOPY  03/29/2012   Procedure: LAPAROSCOPY OPERATIVE;  Surgeon: Hal MoralesVanessa P Haygood, MD;  Location: [facility];  Service: Gynecology;  Laterality: N/A;  with Fulguration of Endometriosis and Peritoneal Biopsy  . TONSILLECTOMY    . TUBAL LIGATION  2010  . UMBILICAL HERNIA REPAIR   10/2010    Family History  Problem Relation Age of Onset  . Hyperlipidemia Mother   . Migraines Mother   . Heart disease Other        paternal family  . Diabetes Paternal Uncle   . Hyperlipidemia Sister   . Colon cancer Neg Hx   . Breast cancer Neg Hx     Allergies  Allergen Reactions  . Morphine And Related Itching  . Meloxicam Other (See Comments)    bloating    Current Outpatient Medications on File Prior to Visit  Medication Sig Dispense Refill  . ALPRAZolam (XANAX) 0.5 MG tablet Take 1-2 tabs 30-60 minutes before MRI. May take one additional if needed right before MRI. Do not drive. 3 tablet 0  . cetirizine (ZYRTEC) 10 MG tablet Take 10 mg by mouth daily.    . clobetasol ointment (TEMOVATE) 0.05 % Apply topically daily as needed. 60 g 5  . doxycycline (VIBRAMYCIN) 100 MG capsule Take 100 mg by mouth 2 (two) times daily.  0  . fluticasone (FLONASE) 50 MCG/ACT nasal spray Place 2 sprays into both nostrils daily as needed for allergies or rhinitis. 16 g 5  . ketorolac (TORADOL) 10 MG tablet Take 10 mg by mouth every 6 (six) hours as needed.    Marland Kitchen. levonorgestrel (MIRENA) 20 MCG/24HR IUD 1 each by Intrauterine route once.    . pentosan polysulfate (ELMIRON) 100 MG capsule Take 100 mg by mouth 2 (two) times daily. rx per gyn    . SUMAtriptan (IMITREX) 100 MG tablet Take 1 tablet (100 mg total) by mouth once as needed for up to 1 dose. May repeat in 2 hours if headache persists or recurs. 10 tablet 12  . traMADol (ULTRAM) 50 MG tablet Take 1-2 tablets (50-100 mg total) by mouth every 6 (six) hours as needed. 30 tablet 0   Current Facility-Administered Medications on File Prior to Visit  Medication Dose Route Frequency Provider Last Rate Last Dose  . gadopentetate dimeglumine (MAGNEVIST) injection 13 mL  13 mL Intravenous Once PRN Anson FretAhern, Antonia B, MD        BP 106/66   Pulse 73   Temp 97.6 F (36.4 C) (Oral)   Resp 16   Ht 4\' 11"  (1.499 m)   Wt 137 lb 12.8 oz (62.5 kg)    SpO2 99%   BMI 27.83 kg/m       Objective:   Physical Exam   General  Mental Status - Alert. General Appearance - Well groomed. Not in acute distress.  Skin Rashes- No Rashes.  HEENT Head- Normal. Ear Auditory Canal - Left- Normal. Right - Normal.Tympanic Membrane- Left- Normal. Right- Normal. Eye Sclera/Conjunctiva- Left- Normal. Right- Normal. Nose & Sinuses Nasal Mucosa- Left-  Boggy and Congested. Right-  Boggy and  Congested.Bilateral maxillary and frontal sinus pressure. Mouth & Throat  Lips: Upper Lip- Normal: no dryness, cracking, pallor, cyanosis, or vesicular eruption. Lower Lip-Normal: no dryness, cracking, pallor, cyanosis or vesicular eruption. Buccal Mucosa- Bilateral- No Aphthous ulcers. Oropharynx- No Discharge or Erythema. Tonsils: Characteristics- Bilateral- No Erythema or Congestion. Size/Enlargement- Bilateral- No enlargement. Discharge- bilateral-None.  Neck Neck- Supple. No Masses.   Chest and Lung Exam Auscultation: Breath Sounds:-Clear even and unlabored.  Cardiovascular Auscultation:Rythm- Regular, rate and rhythm. Murmurs & Other Heart Sounds:Ausculatation of the heart reveal- No Murmurs.  Lymphatic Head & Neck General Head & Neck Lymphatics: Bilateral: Description- No Localized lymphadenopathy.  Back- faint bilateral cva area pain.      Assessment & Plan:  You do have recent sinus infection and most recently some increased frequency of urination with some pain over your kidneys.  Your urine only shows 1+ blood.  I do want you to continue the doxycycline but we gave you 1 g Rocephin in the office pending the urine culture.  I do want you to continue to take the Diflucan 3 tablet regimen that ENT prescribed.  Make sure you hydrate well.  If you get any back pain radiating to your flank areas then let me know and I would get CT abdomen pelvis to look for kidney stones.  I do not think this is the case presently.  Blood that you have appears  to be chronic and likely related to IC or may be urinary tract infection.  No mid lumbar or thoracic pain on exam today.  If you develope mid lumbar or thoracic region pain let me know and would consider getting x-rays.  After discussion, decided to go ahead and refer you to another urologist.  You requested a female urologist and I will try to find one in the area.  Referral would be for IC.  You will need to check your kidney function today and I will put in metabolic panel.  Follow-up in 7 days or as needed.  Monicia Tse, Ramon Dredge, PA-C

## 2017-10-04 LAB — URINE CULTURE
MICRO NUMBER:: 90053922
Result:: NO GROWTH
SPECIMEN QUALITY:: ADEQUATE

## 2017-10-05 ENCOUNTER — Telehealth: Payer: Self-pay | Admitting: Internal Medicine

## 2017-10-05 NOTE — Telephone Encounter (Signed)
Pt. Given lab results and instructions.Verbalizes understanding. 

## 2017-10-07 ENCOUNTER — Ambulatory Visit: Payer: Self-pay | Admitting: *Deleted

## 2017-10-07 NOTE — Telephone Encounter (Signed)
Pt was seen by Ramon DredgeEdward and referral placed on 10/03/2017- Gwen- can you check on urology referral please?

## 2017-10-07 NOTE — Telephone Encounter (Signed)
   Reason for Disposition . [1] MILD-MODERATE pain AND [2] constant and [3] present < 2 hours  Answer Assessment - Initial Assessment Questions 1. LOCATION: "Where does it hurt?"      Lower abdominal pain 2. RADIATION: "Does the pain shoot anywhere else?" (e.g., chest, back)     Pain at belly button 3. ONSET: "When did the pain begin?" (e.g., minutes, hours or days ago)      Symptoms never went away 4. SUDDEN: "Gradual or sudden onset?"     Gradual 5. PATTERN "Does the pain come and go, or is it constant?"    - If constant: "Is it getting better, staying the same, or worsening?"      (Note: Constant means the pain never goes away completely; most serious pain is constant and it progresses)     - If intermittent: "How long does it last?" "Do you have pain now?"     (Note: Intermittent means the pain goes away completely between bouts)     Constant 6. SEVERITY: "How bad is the pain?"  (e.g., Scale 1-10; mild, moderate, or severe)   - MILD (1-3): doesn't interfere with normal activities, abdomen soft and not tender to touch    - MODERATE (4-7): interferes with normal activities or awakens from sleep, tender to touch    - SEVERE (8-10): excruciating pain, doubled over, unable to do any normal activities      8 7. RECURRENT SYMPTOM: "Have you ever had this type of abdominal pain before?" If so, ask: "When was the last time?" and "What happened that time?"      Yes 8. CAUSE: "What do you think is causing the abdominal pain?"     I don't know 9. RELIEVING/AGGRAVATING FACTORS: "What makes it better or worse?" (e.g., movement, antacids, bowel movement)     Nothing 10. OTHER SYMPTOMS: "Has there been any vomiting, diarrhea, constipation, or urine problems?"       No 11. PREGNANCY: "Is there any chance you are pregnant?" "When was your last menstrual period?"       No  Protocols used: ABDOMINAL PAIN - FEMALE-A-AH  Pt. Will wait to see Urology.

## 2017-10-12 DIAGNOSIS — J3089 Other allergic rhinitis: Secondary | ICD-10-CM | POA: Diagnosis not present

## 2017-10-12 DIAGNOSIS — J301 Allergic rhinitis due to pollen: Secondary | ICD-10-CM | POA: Diagnosis not present

## 2017-10-12 DIAGNOSIS — J3081 Allergic rhinitis due to animal (cat) (dog) hair and dander: Secondary | ICD-10-CM | POA: Diagnosis not present

## 2017-10-17 DIAGNOSIS — R319 Hematuria, unspecified: Secondary | ICD-10-CM | POA: Diagnosis not present

## 2017-10-17 DIAGNOSIS — Z01419 Encounter for gynecological examination (general) (routine) without abnormal findings: Secondary | ICD-10-CM | POA: Diagnosis not present

## 2017-10-17 DIAGNOSIS — R102 Pelvic and perineal pain: Secondary | ICD-10-CM | POA: Diagnosis not present

## 2017-10-17 DIAGNOSIS — N3010 Interstitial cystitis (chronic) without hematuria: Secondary | ICD-10-CM | POA: Diagnosis not present

## 2017-10-17 DIAGNOSIS — N803 Endometriosis of pelvic peritoneum: Secondary | ICD-10-CM | POA: Diagnosis not present

## 2017-10-17 DIAGNOSIS — Z113 Encounter for screening for infections with a predominantly sexual mode of transmission: Secondary | ICD-10-CM | POA: Diagnosis not present

## 2017-10-17 DIAGNOSIS — N898 Other specified noninflammatory disorders of vagina: Secondary | ICD-10-CM | POA: Diagnosis not present

## 2017-10-25 DIAGNOSIS — N3010 Interstitial cystitis (chronic) without hematuria: Secondary | ICD-10-CM | POA: Diagnosis not present

## 2017-10-25 DIAGNOSIS — N941 Unspecified dyspareunia: Secondary | ICD-10-CM | POA: Diagnosis not present

## 2017-10-25 DIAGNOSIS — R102 Pelvic and perineal pain: Secondary | ICD-10-CM | POA: Insufficient documentation

## 2017-10-28 ENCOUNTER — Ambulatory Visit: Payer: BLUE CROSS/BLUE SHIELD | Admitting: Internal Medicine

## 2017-11-08 ENCOUNTER — Other Ambulatory Visit: Payer: Self-pay

## 2017-11-08 ENCOUNTER — Ambulatory Visit: Payer: BLUE CROSS/BLUE SHIELD | Admitting: Allergy and Immunology

## 2017-11-08 ENCOUNTER — Encounter: Payer: Self-pay | Admitting: Allergy and Immunology

## 2017-11-08 VITALS — BP 110/60 | HR 60 | Temp 98.2°F | Resp 18 | Ht 59.0 in | Wt 138.4 lb

## 2017-11-08 DIAGNOSIS — J3089 Other allergic rhinitis: Secondary | ICD-10-CM

## 2017-11-08 DIAGNOSIS — J0101 Acute recurrent maxillary sinusitis: Secondary | ICD-10-CM | POA: Diagnosis not present

## 2017-11-08 DIAGNOSIS — J4520 Mild intermittent asthma, uncomplicated: Secondary | ICD-10-CM

## 2017-11-08 MED ORDER — CARBINOXAMINE MALEATE 6 MG PO TABS: 6.0000 mg | ORAL_TABLET | Freq: Four times a day (QID) | ORAL | 5 refills | Status: DC | PRN

## 2017-11-08 MED ORDER — FLUTICASONE PROPIONATE 93 MCG/ACT NA EXHU: 2.0000 | INHALANT_SUSPENSION | Freq: Two times a day (BID) | NASAL | 5 refills | Status: DC

## 2017-11-08 MED ORDER — ALBUTEROL SULFATE HFA 108 (90 BASE) MCG/ACT IN AERS
2.0000 | INHALATION_SPRAY | RESPIRATORY_TRACT | 3 refills | Status: DC | PRN
Start: 2017-11-08 — End: 2018-03-06

## 2017-11-08 NOTE — Assessment & Plan Note (Signed)
   Treatment plan as outlined above for allergic rhinitis. 

## 2017-11-08 NOTE — Patient Instructions (Addendum)
Perennial allergic rhinitis Perennial allergic rhinitis with a possible nonallergic component.  Aeroallergen avoidance measures have been discussed and provided in written form.  A prescription has been provided for [facility], 2 actuations per nostril twice a day. Proper technique has been discussed and demonstrated.  Nasal saline lavage (NeilMed) has been recommended as needed and prior to medicated nasal sprays along with instructions for proper administration.  A prescription has been provided for RyVent (carbinoxamine maleate) 6mg  every 6-8 hours as needed.  For sinus pressure, add pseudoephedrine as needed. Pseudoephedrine is only to be used for short-term relief of nasal/sinus congestion. Long-term use is discouraged due to potential side effects.  The risks and benefits of aeroallergen immunotherapy have been discussed. The patient is motivated to initiate immunotherapy if insurance coverage is favorable. She will let us know how she would like to proceed.  Recurrent maxillary sinusitis  Treatment plan as outlined above for allergic rhinitis.  Mild intermittent asthma/exercise-induced bronchospasm  A prescription has been provided for albuterol HFA, 1-2 inhalations every 6 hours if needed and 10-15 minutes prior to exercise.  Subjective and objective measures of pulmonary function will be followed and the treatment plan will be adjusted accordingly.   Return in about 3 months (around 02/05/2018), or if symptoms worsen or fail to improve.  Control of House Dust Mite Allergen  House dust mites play a major role in allergic asthma and rhinitis.  They occur in environments with high humidity wherever human skin, the food for dust mites is found. High levels have been detected in dust obtained from mattresses, pillows, carpets, upholstered furniture, bed covers, clothes and soft toys.  The principal allergen of the house dust mite is found in its feces.  A gram of dust may contain 1,000  mites and 250,000 fecal particles.  Mite antigen is easily measured in the air during house cleaning activities.    1. Encase mattresses, including the box spring, and pillow, in an air tight cover.  Seal the zipper end of the encased mattresses with wide adhesive tape. 2. Wash the bedding in water of 130 degrees Farenheit weekly.  Avoid cotton comforters/quilts and flannel bedding: the most ideal bed covering is the dacron comforter. 3. Remove all upholstered furniture from the bedroom. 4. Remove carpets, carpet padding, rugs, and non-washable window drapes from the bedroom.  Wash drapes weekly or use plastic window coverings. 5. Remove all non-washable stuffed toys from the bedroom.  Wash stuffed toys weekly. 6. Have the room cleaned frequently with a vacuum cleaner and a damp dust-mop.  The patient should not be in a room which is being cleaned and should wait 1 hour after cleaning before going into the room. 7. Close and seal all heating outlets in the bedroom.  Otherwise, the room will become filled with dust-laden air.  An electric heater can be used to heat the room. 8. Reduce indoor humidity to less than 50%.  Do not use a humidifier.  Control of Mold Allergen  Mold and fungi can grow on a variety of surfaces provided certain temperature and moisture conditions exist.  Outdoor molds grow on plants, decaying vegetation and soil.  The major outdoor mold, Alternaria and Cladosporium, are found in very high numbers during hot and dry conditions.  Generally, a late Summer - Fall peak is seen for common outdoor fungal spores.  Rain will temporarily lower outdoor mold spore count, but counts rise rapidly when the rainy period ends.  The most important indoor molds are Aspergillus and  Penicillium.  Dark, humid and poorly ventilated basements are ideal sites for mold growth.  The next most common sites of mold growth are the bathroom and the kitchen.  Outdoor MicrosoftMold Control 1. Use air conditioning and  keep windows closed 2. Avoid exposure to decaying vegetation. 3. Avoid leaf raking. 4. Avoid grain handling. 5. Consider wearing a face mask if working in moldy areas.  Indoor Mold Control 1. Maintain humidity below 50%. 2. Clean washable surfaces with 5% bleach solution. 3. Remove sources e.g. Contaminated carpets.

## 2017-11-08 NOTE — Progress Notes (Signed)
New Patient Note  RE: Sierra ChyleMarlen M Hodge MRN: [id number] DOB: [birth year] Date of Office Visit: 11/08/2017  Referring provider: Keturah Barrerossley, James J, MD Primary care provider: Wanda PlumpPaz, Jose E, MD  Chief Complaint: Sinus Problem and Nasal Congestion   History of present illness: Sierra HomansMarlen Hodge is a 42 y.o. female seen today in consultation requested by Hermelinda MedicusJames Crossley, MD.  She reports that over the past 3 years she has had "really bad" headaches.  Headaches are located over the forehead and cheekbones, with occasional involvement of the occiput area.  She also experiences nasal congestion, sneezing, and ear pressure.  These symptoms occur year around.  She does note that the symptoms may be triggered by strong aromas, such as chemicals and detergents.  Cetirizine and fluticasone nasal spray have failed to adequately alleviate the symptoms.  Over the past 3 years she has been on multiple rounds of antibiotics which seemed to relieve the headaches.  She was evaluated by a neurologist in December 2018 and was prescribed medication for migraines without benefit.  The neurologist noted severe nasal congestion on examination and so the patient went to go see an otolaryngologist, Dr. Haroldine Lawsrossley.  She was successfully treated for a sinus infection with 2 rounds of antibiotics. She notes that she had exercise induced asthma as a teenager and was prescribed and used albuterol rescue during those years.  She notes that recently she has experienced dyspnea and wheezing which limits her from running or climbing a flight of stairs quickly.  She does not experience lower respiratory symptoms at rest and denies nocturnal awakenings due to lower respiratory symptoms.   Assessment and plan: Perennial allergic rhinitis Perennial allergic rhinitis with a possible nonallergic component.  Aeroallergen avoidance measures have been discussed and provided in written form.  A prescription has been provided for [facility], 2  actuations per nostril twice a day. Proper technique has been discussed and demonstrated.  Nasal saline lavage (NeilMed) has been recommended as needed and prior to medicated nasal sprays along with instructions for proper administration.  A prescription has been provided for RyVent (carbinoxamine maleate) 6mg  every 6-8 hours as needed.  For sinus pressure, add pseudoephedrine as needed. Pseudoephedrine is only to be used for short-term relief of nasal/sinus congestion. Long-term use is discouraged due to potential side effects.  The risks and benefits of aeroallergen immunotherapy have been discussed. The patient is motivated to initiate immunotherapy if insurance coverage is favorable. She will let us know how she would like to proceed.  Recurrent maxillary sinusitis  Treatment plan as outlined above for allergic rhinitis.  Mild intermittent asthma/exercise-induced bronchospasm  A prescription has been provided for albuterol HFA, 1-2 inhalations every 6 hours if needed and 10-15 minutes prior to exercise.  Subjective and objective measures of pulmonary function will be followed and the treatment plan will be adjusted accordingly.   Meds ordered this encounter  Medications  . Fluticasone Propionate (XHANCE) 93 MCG/ACT EXHU    Sig: Place 2 puffs into the nose 2 (two) times daily.    Dispense:  32 mL    Refill:  5  . DISCONTD: Carbinoxamine Maleate (RYVENT) 6 MG TABS    Sig: Take 6 mg by mouth every 6 (six) hours as needed.    Dispense:  30 tablet    Refill:  5    RUN AS CASH PAY ONLY! BIN  [id number]  RXPCN  [id number]  Group  [id number]  CardholderID  [id number]  . albuterol (PROAIR HFA) 108 (90 Base) MCG/ACT  inhaler    Sig: Inhale 2 puffs into the lungs every 4 (four) hours as needed for wheezing or shortness of breath.    Dispense:  1 Inhaler    Refill:  3    Diagnostics: Spirometry: FVC was 3.19 L and FEV1 was 2.69 L (109% predicted) with an FEV1 ratio of 101%.  Please see scanned  spirometry results for details. Environmental skin testing: Positive to molds and dust mite antigen.   Physical examination: Blood pressure 110/60, pulse 60, temperature 98.2 F (36.8 C), temperature source Oral, resp. rate 18, height 4\' 11"  (1.499 m), weight 138 lb 6.4 oz (62.8 kg), SpO2 95 %.  General: Alert, interactive, in no acute distress. HEENT: TMs pearly gray, turbinates edematous with clear discharge, post-pharynx moderately erythematous. Neck: Supple without lymphadenopathy. Lungs: Clear to auscultation without wheezing, rhonchi or rales. CV: Normal S1, S2 without murmurs. Abdomen: Nondistended, nontender. Skin: Warm and dry, without lesions or rashes. Extremities:  No clubbing, cyanosis or edema. Neuro:   Grossly intact.  Review of systems:  Review of systems negative except as noted in HPI / PMHx or noted below: Review of Systems  Constitutional: Negative.   HENT: Negative.   Eyes: Negative.   Respiratory: Negative.   Cardiovascular: Negative.   Gastrointestinal: Negative.   Genitourinary: Negative.   Musculoskeletal: Negative.   Skin: Negative.   Neurological: Negative.   Endo/Heme/Allergies: Negative.   Psychiatric/Behavioral: Negative.     Past medical history:  Past Medical History:  Diagnosis Date  . Chronic interstitial cystitis   . Complication of anesthesia    hard to wake after anesthesia  . Endometriosis   . Headache   . Hyperlipidemia   . IUD (intrauterine device) in place    Mirena, Dr. Pennie Rushing  . Other psoriasis     Past surgical history:  Past Surgical History:  Procedure Laterality Date  . CESAREAN SECTION     2008, 2010  . CHOLECYSTECTOMY N/A 07/07/2017   Procedure: LAPAROSCOPIC CHOLECYSTECTOMY;  Surgeon: Abigail Miyamoto, MD;  Location: [facility];  Service: General;  Laterality: N/A;  . CYSTO WITH HYDRODISTENSION  03/29/2012   Procedure: CYSTOSCOPY/HYDRODISTENSION;  Surgeon: Martina Sinner, MD;  Location: [facility];  Service: Urology;  Laterality: N/A;  with Instillation of Marcaine and Pyridium   . LAPAROSCOPY  03/29/2012   Procedure: LAPAROSCOPY OPERATIVE;  Surgeon: Hal Morales, MD;  Location: [facility];  Service: Gynecology;  Laterality: N/A;  with Fulguration of Endometriosis and Peritoneal Biopsy  . TONSILLECTOMY    . TUBAL LIGATION  2010  . UMBILICAL HERNIA REPAIR  10/2010    Family history: Family History  Problem Relation Age of Onset  . Hyperlipidemia Mother   . Migraines Mother   . Heart disease Other        paternal family  . Diabetes Paternal Uncle   . Hyperlipidemia Sister   . Colon cancer Neg Hx   . Breast cancer Neg Hx     Social history: Social History   Socioeconomic History  . Marital status: Married    Spouse name: Not on file  . Number of children: 2  . Years of education: 37  . Highest education level: Not on file  Social Needs  . Financial resource strain: Not on file  . Food insecurity - worry: Not on file  . Food insecurity - inability: Not on file  . Transportation needs - medical: Not on file  . Transportation needs - non-medical: Not on file  Occupational  History  . Occupation: Medical interpreter    Comment: Spanish  Tobacco Use  . Smoking status: Never Smoker  . Smokeless tobacco: Never Used  Substance and Sexual Activity  . Alcohol use: Yes    Alcohol/week: 0.6 oz    Types: 1 Glasses of wine per week    Comment: rarely   . Drug use: No  . Sexual activity: Yes    Partners: Male    Birth control/protection: Surgical    Comment: BTL   Other Topics Concern  . Not on file  Social History Narrative   Married    Employed as a Research officer, trade union,  mainly medical   From [place] ; 2 daughters 2008, 2010        Environmental History: The patient lives in a house with carpeting in the bedroom gas heat, and central air.  There is no known mold/water damage in the home.  There is a dog in the home which has access to her  bedroom.  Allergies as of 11/08/2017      Reactions   Morphine And Related Itching   Meloxicam Other (See Comments)   bloating      Medication List        Accurate as of 11/08/17  1:36 PM. Always use your most recent med list.          albuterol 108 (90 Base) MCG/ACT inhaler Commonly known as:  PROAIR HFA Inhale 2 puffs into the lungs every 4 (four) hours as needed for wheezing or shortness of breath.   ALPRAZolam 0.5 MG tablet Commonly known as:  XANAX Take 1-2 tabs 30-60 minutes before MRI. May take one additional if needed right before MRI. Do not drive.   Carbinoxamine Maleate 6 MG Tabs Commonly known as:  RYVENT Take 6 mg by mouth every 6 (six) hours as needed.   cetirizine 10 MG tablet Commonly known as:  ZYRTEC Take 10 mg by mouth daily.   clobetasol ointment 0.05 % Commonly known as:  TEMOVATE Apply topically daily as needed.   doxycycline 100 MG capsule Commonly known as:  VIBRAMYCIN Take 100 mg by mouth 2 (two) times daily.   fluticasone 50 MCG/ACT nasal spray Commonly known as:  FLONASE Place 2 sprays into both nostrils daily as needed for allergies or rhinitis.   Fluticasone Propionate 93 MCG/ACT Exhu Commonly known as:  XHANCE Place 2 puffs into the nose 2 (two) times daily.   ketorolac 10 MG tablet Commonly known as:  TORADOL Take 10 mg by mouth every 6 (six) hours as needed.   levonorgestrel 20 MCG/24HR IUD Commonly known as:  MIRENA 1 each by Intrauterine route once.   Omega-3 1000 MG Caps Take by mouth.   pentosan polysulfate 100 MG capsule Commonly known as:  ELMIRON Take 100 mg by mouth 2 (two) times daily. rx per gyn   SUMAtriptan 100 MG tablet Commonly known as:  IMITREX Take 1 tablet (100 mg total) by mouth once as needed for up to 1 dose. May repeat in 2 hours if headache persists or recurs.   traMADol 50 MG tablet Commonly known as:  ULTRAM Take 1-2 tablets (50-100 mg total) by mouth every 6 (six) hours as needed.   Vitamin  E 100 units Tabs Take by mouth.       Known medication allergies: Allergies  Allergen Reactions  . Morphine And Related Itching  . Meloxicam Other (See Comments)    bloating    I appreciate the opportunity to  take part in Joniece's care. Please do not hesitate to contact me with questions.  Sincerely,   R. Jorene Guest, MD

## 2017-11-08 NOTE — Assessment & Plan Note (Addendum)
   A prescription has been provided for albuterol HFA, 1-2 inhalations every 6 hours if needed and 10-15 minutes prior to exercise.  Subjective and objective measures of pulmonary function will be followed and the treatment plan will be adjusted accordingly.

## 2017-11-08 NOTE — Assessment & Plan Note (Addendum)
Perennial allergic rhinitis with a possible nonallergic component.  Aeroallergen avoidance measures have been discussed and provided in written form.  A prescription has been provided for [facility], 2 actuations per nostril twice a day. Proper technique has been discussed and demonstrated.  Nasal saline lavage (NeilMed) has been recommended as needed and prior to medicated nasal sprays along with instructions for proper administration.  A prescription has been provided for RyVent (carbinoxamine maleate) 6mg  every 6-8 hours as needed.  For sinus pressure, add pseudoephedrine as needed. Pseudoephedrine is only to be used for short-term relief of nasal/sinus congestion. Long-term use is discouraged due to potential side effects.  The risks and benefits of aeroallergen immunotherapy have been discussed. The patient is motivated to initiate immunotherapy if insurance coverage is favorable. She will let us know how she would like to proceed.

## 2017-11-28 ENCOUNTER — Telehealth: Payer: Self-pay

## 2017-11-28 ENCOUNTER — Other Ambulatory Visit: Payer: Self-pay

## 2017-11-28 DIAGNOSIS — R102 Pelvic and perineal pain: Secondary | ICD-10-CM | POA: Diagnosis not present

## 2017-11-28 DIAGNOSIS — N803 Endometriosis of pelvic peritoneum: Secondary | ICD-10-CM | POA: Diagnosis not present

## 2017-11-28 DIAGNOSIS — J3089 Other allergic rhinitis: Secondary | ICD-10-CM

## 2017-11-28 DIAGNOSIS — N3010 Interstitial cystitis (chronic) without hematuria: Secondary | ICD-10-CM | POA: Diagnosis not present

## 2017-11-28 MED ORDER — CARBINOXAMINE MALEATE 6 MG PO TABS: 6.0000 mg | ORAL_TABLET | Freq: Four times a day (QID) | ORAL | 5 refills | Status: DC | PRN

## 2017-11-28 NOTE — Telephone Encounter (Signed)
Patient has not received her prescription Ryvent.  She is wondering can it be sent to the same pharmacy as the xhance.  Please Advise

## 2017-11-28 NOTE — Telephone Encounter (Signed)
Spoke with pt and she stated the ryvent was $45 I called pharmacy and they stated that they had no order for the ryvent. I send back in and told them to run it as cash pay

## 2017-11-29 ENCOUNTER — Other Ambulatory Visit: Payer: Self-pay

## 2017-11-29 DIAGNOSIS — J3089 Other allergic rhinitis: Secondary | ICD-10-CM

## 2017-11-29 MED ORDER — CARBINOXAMINE MALEATE 6 MG PO TABS: 6.0000 mg | ORAL_TABLET | Freq: Four times a day (QID) | ORAL | 5 refills | Status: DC | PRN

## 2017-11-29 NOTE — Telephone Encounter (Signed)
Left message for patient to call. Needs RX for Ryvent sent to alternate pharmacy as [facility] not [facility] for free med.

## 2017-11-30 ENCOUNTER — Ambulatory Visit: Payer: Self-pay | Admitting: *Deleted

## 2017-11-30 ENCOUNTER — Other Ambulatory Visit: Payer: Self-pay | Admitting: Internal Medicine

## 2017-11-30 MED ORDER — OSELTAMIVIR PHOSPHATE 75 MG PO CAPS: 75.0000 mg | ORAL_CAPSULE | Freq: Two times a day (BID) | ORAL | 0 refills | Status: DC

## 2017-11-30 NOTE — Addendum Note (Signed)
Addended byConrad [place]: Jamire Shabazz D on: 11/30/2017 04:53 PM   Modules accepted: Orders

## 2017-11-30 NOTE — Telephone Encounter (Signed)
Called in c/o runny nose, sore throat, and a headache that just started.   My daughter was just diagnosed with the flu.  Her doctor suggested I call my doctor and get started on medication since I am having the same symptoms as my daughter.  I routed a high priority note to Dr. Leta JunglingPaz's nurse pool making them aware.   I let the pt know someone would be in touch with her regarding Dr. Leta JunglingPaz's decision.  [facility] [address], [place], [place] Reason for Disposition . [1] Influenza EXPOSURE within last 72 hours (3 days) AND [2] NOT HIGH RISK AND [3] strongly requests antiviral medication  Answer Assessment - Initial Assessment Questions 1. TYPE of EXPOSURE: "How were you exposed?" (e.g., close contact, not a close contact)     My daughter has the flu.   I have headache, runny nose, sore throat.    2. DATE of EXPOSURE: "When did the exposure occur?" (e.g., hour, days, weeks)     My daughter started day before yesterday with her symptoms. 3. PREGNANCY: "Is there any chance you are pregnant?" "When was your last menstrual period?"     No way 4. HIGH RISK for COMPLICATIONS: "Do you have any heart or lung problems? Do you have a weakened immune system?" (e.g., CHF, COPD, asthma, HIV positive, chemotherapy, renal failure, diabetes mellitus, sickle cell anemia)     No.   I have mild asthma.   I've never had to use my inhaler. 5. SYMPTOMS: "Do you have any symptoms?" (e.g., cough, fever, sore throat, difficulty breathing).     Earache no.   I took a Cold and Flu pill at 11:00am so it has helped.  Protocols used: INFLUENZA EXPOSURE-A-AH

## 2017-11-30 NOTE — Telephone Encounter (Signed)
Copied from CRM [phone]#[id number]. Topic: Quick Communication - Rx Refill/Question >> Nov 30, 2017  6:35 PM Zada GirtLander, Lumin L wrote: Medication: oseltamivir (TAMIFLU) 75 MG capsule  Has the patient contacted their pharmacy? Yes.    (Agent: If no, request that the patient contact the pharmacy for the refill.)  Preferred Pharmacy (with phone number or street name): [id number], [place] - [address] [place] [id number] Phone: [phone] Fax: [phone]  Agent: Please be advised that RX refills may take up to 3 business days. We ask that you follow-up with your pharmacy.

## 2017-11-30 NOTE — Telephone Encounter (Addendum)
Rx sent. Informed Pt of recommendations and that Rx has been sent. Pt verbalized understanding.

## 2017-11-30 NOTE — Telephone Encounter (Signed)
Pt needs OV if she is having symptoms of flu.

## 2017-11-30 NOTE — Telephone Encounter (Signed)
Called pt to advise her to come in for an appt regarding her request for tamiflu. Pt stated that with her daughter being sick she has no way to get here for an appt and no one to watch her child.

## 2017-11-30 NOTE — Telephone Encounter (Signed)
Please advise 

## 2017-11-30 NOTE — Telephone Encounter (Signed)
Since she has been exposed to the flu and now has symptoms needs treatment. Advised patient: Tamiflu 75 mg 1 p.o. twice daily #10 no refills Rest, fluids, Tylenol. Office visit or urgent care if symptoms severe or not better in a few days

## 2017-12-01 NOTE — Telephone Encounter (Signed)
This has been taken care of in another encounter.

## 2017-12-02 NOTE — Telephone Encounter (Signed)
Pt called- she says that rx was not received by [facility] [address]  [phone]

## 2017-12-02 NOTE — Telephone Encounter (Signed)
[facility] [place] [facility] 11/30/17 phone note. Notified pt.

## 2017-12-05 ENCOUNTER — Ambulatory Visit: Payer: BLUE CROSS/BLUE SHIELD | Admitting: Neurology

## 2017-12-20 ENCOUNTER — Other Ambulatory Visit: Payer: Self-pay | Admitting: *Deleted

## 2017-12-20 DIAGNOSIS — N3010 Interstitial cystitis (chronic) without hematuria: Secondary | ICD-10-CM | POA: Diagnosis not present

## 2017-12-20 DIAGNOSIS — N941 Unspecified dyspareunia: Secondary | ICD-10-CM | POA: Diagnosis not present

## 2017-12-20 DIAGNOSIS — R102 Pelvic and perineal pain: Secondary | ICD-10-CM | POA: Diagnosis not present

## 2017-12-20 DIAGNOSIS — J3089 Other allergic rhinitis: Secondary | ICD-10-CM

## 2017-12-20 MED ORDER — CARBINOXAMINE MALEATE 6 MG PO TABS: 6.0000 mg | ORAL_TABLET | Freq: Four times a day (QID) | ORAL | 5 refills | Status: DC | PRN

## 2017-12-20 NOTE — Telephone Encounter (Signed)
Patient called states she wanted to make sure that she was on an antihistamine. States she went to see her urology and was advised she needs to be on a antihistamine to help her bladder. Advised patient she is on Ryvent which is an antihistamine and Clinical research associatewriter would resend it.

## 2017-12-20 NOTE — Telephone Encounter (Signed)
[facility] [address]

## 2017-12-20 NOTE — Telephone Encounter (Signed)
[facility]

## 2017-12-22 ENCOUNTER — Encounter: Payer: Self-pay | Admitting: Internal Medicine

## 2017-12-22 ENCOUNTER — Telehealth: Payer: Self-pay | Admitting: Allergy and Immunology

## 2017-12-22 ENCOUNTER — Ambulatory Visit: Payer: BLUE CROSS/BLUE SHIELD | Admitting: Internal Medicine

## 2017-12-22 VITALS — BP 118/68 | HR 79 | Temp 97.9°F | Resp 14 | Ht 59.0 in | Wt 138.4 lb

## 2017-12-22 DIAGNOSIS — R51 Headache: Secondary | ICD-10-CM

## 2017-12-22 DIAGNOSIS — J029 Acute pharyngitis, unspecified: Secondary | ICD-10-CM | POA: Diagnosis not present

## 2017-12-22 DIAGNOSIS — R519 Headache, unspecified: Secondary | ICD-10-CM

## 2017-12-22 DIAGNOSIS — J0101 Acute recurrent maxillary sinusitis: Secondary | ICD-10-CM

## 2017-12-22 DIAGNOSIS — J309 Allergic rhinitis, unspecified: Secondary | ICD-10-CM

## 2017-12-22 LAB — POCT RAPID STREP A (OFFICE): Rapid Strep A Screen: NEGATIVE

## 2017-12-22 MED ORDER — CARBINOXAMINE MALEATE 6 MG PO TABS: 1.0000 | ORAL_TABLET | ORAL | 5 refills | Status: DC

## 2017-12-22 MED ORDER — PREDNISONE 10 MG PO TABS: ORAL_TABLET | ORAL | 0 refills | Status: DC

## 2017-12-22 MED ORDER — AZELASTINE HCL 0.1 % NA SOLN: 2.0000 | Freq: Two times a day (BID) | NASAL | 5 refills | Status: DC | PRN

## 2017-12-22 NOTE — Telephone Encounter (Signed)
Ryvent is no longer cash pay of 10.00. I was just informed of this information. Please advise on an alternative. Thank you.

## 2017-12-22 NOTE — Telephone Encounter (Signed)
Patient should only be paying 10.00 for the 30 tablets without insurance.

## 2017-12-22 NOTE — Patient Instructions (Signed)
Continue routine medicines  Take prednisone for a few days  Add another nose spray called Astelin twice a day  Call if  not gradually better or call your allergist  Robitussin DM or Mucinex DM as needed for cough

## 2017-12-22 NOTE — Telephone Encounter (Signed)
Called and could not leave message advising of change.

## 2017-12-22 NOTE — Progress Notes (Signed)
Subjective:    Patient ID: Sierra Hodge, female    DOB: 05-30-1976, 42 y.o.   MRN: [id number]  DOS:  12/22/2017 Type of visit - description : acute Interval history: Symptoms of started 4 days ago: Sore throat, "headache" located at the forehead, eyes irritated. Since the last visit, saw ENT, neurology, allergist, notes reviewed   Review of Systems  Denies fever chills No nausea or vomiting No myalgias Mild cough without chest congestion or wheezing.  Past Medical History:  Diagnosis Date  . Chronic interstitial cystitis   . Complication of anesthesia    hard to wake after anesthesia  . Endometriosis   . Headache   . Hyperlipidemia   . IUD (intrauterine device) in place    Mirena, Dr. Pennie Rushing  . Other psoriasis     Past Surgical History:  Procedure Laterality Date  . CESAREAN SECTION     2008, 2010  . CHOLECYSTECTOMY N/A 07/07/2017   Procedure: LAPAROSCOPIC CHOLECYSTECTOMY;  Surgeon: Abigail Miyamoto, MD;  Location: [facility];  Service: General;  Laterality: N/A;  . CYSTO WITH HYDRODISTENSION  03/29/2012   Procedure: CYSTOSCOPY/HYDRODISTENSION;  Surgeon: Martina Sinner, MD;  Location: [facility];  Service: Urology;  Laterality: N/A;  with Instillation of Marcaine and Pyridium   . LAPAROSCOPY  03/29/2012   Procedure: LAPAROSCOPY OPERATIVE;  Surgeon: Hal Morales, MD;  Location: [facility];  Service: Gynecology;  Laterality: N/A;  with Fulguration of Endometriosis and Peritoneal Biopsy  . TONSILLECTOMY    . TUBAL LIGATION  2010  . UMBILICAL HERNIA REPAIR  10/2010    Social History   Socioeconomic History  . Marital status: Married    Spouse name: Not on file  . Number of children: 2  . Years of education: 88  . Highest education level: Not on file  Occupational History  . Occupation: Medical interpreter    Comment: Spanish  Social Needs  . Financial resource strain: Not on file  . Food insecurity:    Worry: Not on file    Inability: Not  on file  . Transportation needs:    Medical: Not on file    Non-medical: Not on file  Tobacco Use  . Smoking status: Never Smoker  . Smokeless tobacco: Never Used  Substance and Sexual Activity  . Alcohol use: Yes    Alcohol/week: 0.6 oz    Types: 1 Glasses of wine per week    Comment: rarely   . Drug use: No  . Sexual activity: Yes    Partners: Male    Birth control/protection: Surgical    Comment: BTL   Lifestyle  . Physical activity:    Days per week: Not on file    Minutes per session: Not on file  . Stress: Not on file  Relationships  . Social connections:    Talks on phone: Not on file    Gets together: Not on file    Attends religious service: Not on file    Active member of club or organization: Not on file    Attends meetings of clubs or organizations: Not on file    Relationship status: Not on file  . Intimate partner violence:    Fear of current or ex partner: Not on file    Emotionally abused: Not on file    Physically abused: Not on file    Forced sexual activity: Not on file  Other Topics Concern  . Not on file  Social History Narrative  Married    Employed as a Research officer, trade unionpanish interpreter,  mainly medical   From [place] ; 2 daughters 2008, 2010           Allergies as of 12/22/2017      Reactions   Morphine And Related Itching   Meloxicam Other (See Comments)   bloating      Medication List        Accurate as of 12/22/17 10:30 AM. Always use your most recent med list.          albuterol 108 (90 Base) MCG/ACT inhaler Commonly known as:  PROAIR HFA Inhale 2 puffs into the lungs every 4 (four) hours as needed for wheezing or shortness of breath.   Carbinoxamine Maleate 6 MG Tabs Commonly known as:  RYVENT Take 6 mg by mouth every 6 (six) hours as needed.   cetirizine 10 MG tablet Commonly known as:  ZYRTEC Take 10 mg by mouth daily.   clobetasol ointment 0.05 % Commonly known as:  TEMOVATE Apply topically daily as needed.   fluticasone 50  MCG/ACT nasal spray Commonly known as:  FLONASE Place 2 sprays into both nostrils daily as needed for allergies or rhinitis.   Fluticasone Propionate 93 MCG/ACT Exhu Commonly known as:  XHANCE Place 2 puffs into the nose 2 (two) times daily.   levonorgestrel 20 MCG/24HR IUD Commonly known as:  MIRENA 1 each by Intrauterine route once.   Omega-3 1000 MG Caps Take by mouth.   pentosan polysulfate 100 MG capsule Commonly known as:  ELMIRON Take 100 mg by mouth 2 (two) times daily. rx per gyn   SUMAtriptan 100 MG tablet Commonly known as:  IMITREX Take 1 tablet (100 mg total) by mouth once as needed for up to 1 dose. May repeat in 2 hours if headache persists or recurs.   Vitamin E 100 units Tabs Take by mouth.          Objective:   Physical Exam BP 118/68 (BP Location: Left Arm, Patient Position: Sitting, Cuff Size: Small)   Pulse 79   Temp 97.9 F (36.6 C) (Oral)   Resp 14   Ht 4\' 11"  (1.499 m)   Wt 138 lb 6 oz (62.8 kg)   SpO2 96%   BMI 27.95 kg/m   General:   Well developed, well nourished . NAD.  HEENT:  Normocephalic . Face symmetric, atraumatic.  TMs normal, nose is slightly congested, throat symmetric, no red. Sinuses slt TTP throughout Lungs:.  CTA B Normal respiratory effort, no intercostal retractions, no accessory muscle use. Heart: RRR,  no murmur.  No pretibial edema bilaterally  Skin: Not pale. Not jaundice Neurologic:  alert & oriented X3.  Speech normal, gait appropriate for age and unassisted Psych--  Cognition and judgment appear intact.  Cooperative with normal attention span and concentration.  Behavior appropriate. No anxious or depressed appearing.      Assessment & Plan:    Assessment Hyperlipidemia Saw Dr Rod CanBobbit 10/2017: -Allergies -asthma   Chronic Interstitial Cystitis (on elmiron, rx per gyn, no recent urology visit as of 05-2016) H/o psoriasis - seen Dr Lendon ColonelHawks before  Endometriosis -- has a Mirena Headaches: Chronic, saw  neurology (08/2018, normal MRI, migraines?), saw ENT(09-2017), saw the allergist (10-2017) BTL  PLAN HAs: Several things happen since the last visit, chart reviewed: Sinus CT negative Saw neurology,felt to have a migraine, MRI of the brain ~08-2017 normal. She was referred to ENT, diagnosed with sinusitis, treated with doxycycline which she could not  tolerate. ENT recommend to see the allergist, she was diagnosed with perineal allergic rhinitis and asthma. Acute sinusitis: Presents today with acute on chronic symptoms.  Suspect symptoms are allergy related. Plan: Low-dose prednisone for a few days, and Astelin, Mucinex DM.

## 2017-12-22 NOTE — Assessment & Plan Note (Signed)
HAs: Several things happen since the last visit, chart reviewed: Sinus CT negative Saw neurology,felt to have a migraine, MRI of the brain ~08-2017 normal. She was referred to ENT, diagnosed with sinusitis, treated with doxycycline which she could not tolerate. ENT recommend to see the allergist, she was diagnosed with perineal allergic rhinitis and asthma. Acute sinusitis: Presents today with acute on chronic symptoms.  Suspect symptoms are allergy related. Plan: Low-dose prednisone for a few days, and Astelin, Mucinex DM.

## 2017-12-22 NOTE — Progress Notes (Signed)
Pre visit review using our clinic review tool, if applicable. No additional management support is needed unless otherwise documented below in the visit note. 

## 2017-12-22 NOTE — Telephone Encounter (Signed)
Generic carbinoxamine 6 mg every 8 hours if needed.  Thanks.

## 2017-12-22 NOTE — Telephone Encounter (Signed)
Pt called and said that she can not afford  To pay $50.00 a month for the Ryvent .she need to have the coupon sent to [facility] on [address]. [phone].

## 2018-01-18 ENCOUNTER — Ambulatory Visit: Payer: BLUE CROSS/BLUE SHIELD | Admitting: Neurology

## 2018-02-07 ENCOUNTER — Ambulatory Visit: Payer: BLUE CROSS/BLUE SHIELD | Admitting: Allergy and Immunology

## 2018-02-16 ENCOUNTER — Telehealth: Payer: Self-pay | Admitting: Internal Medicine

## 2018-02-16 NOTE — Telephone Encounter (Signed)
Left message for patient to call back  

## 2018-02-20 NOTE — Telephone Encounter (Signed)
Attempted to return call again this am.  Message says the person you are trying to reach is not accepting calls. I will await a return call from her,

## 2018-02-27 DIAGNOSIS — Z1231 Encounter for screening mammogram for malignant neoplasm of breast: Secondary | ICD-10-CM | POA: Diagnosis not present

## 2018-02-27 LAB — HM MAMMOGRAPHY

## 2018-03-06 ENCOUNTER — Ambulatory Visit: Payer: BLUE CROSS/BLUE SHIELD | Admitting: Physician Assistant

## 2018-03-06 ENCOUNTER — Encounter: Payer: Self-pay | Admitting: Physician Assistant

## 2018-03-06 ENCOUNTER — Other Ambulatory Visit: Payer: Self-pay

## 2018-03-06 ENCOUNTER — Encounter (INDEPENDENT_AMBULATORY_CARE_PROVIDER_SITE_OTHER): Payer: Self-pay

## 2018-03-06 ENCOUNTER — Encounter

## 2018-03-06 VITALS — BP 90/66 | HR 84 | Ht 58.5 in | Wt 138.0 lb

## 2018-03-06 DIAGNOSIS — R142 Eructation: Secondary | ICD-10-CM | POA: Diagnosis not present

## 2018-03-06 DIAGNOSIS — R1013 Epigastric pain: Secondary | ICD-10-CM

## 2018-03-06 DIAGNOSIS — Z9049 Acquired absence of other specified parts of digestive tract: Secondary | ICD-10-CM

## 2018-03-06 DIAGNOSIS — R634 Abnormal weight loss: Secondary | ICD-10-CM

## 2018-03-06 MED ORDER — OMEPRAZOLE 40 MG PO CPDR: 40.0000 mg | DELAYED_RELEASE_CAPSULE | Freq: Every day | ORAL | 2 refills | Status: DC

## 2018-03-06 NOTE — Patient Instructions (Addendum)
If you are age 44 or older, your body mass index should be between 23-30. Your Body mass index is 28.35 kg/m. If this is out of the aforementioned range listed, please consider follow up with your Primary Care Provider.  If you are age 264 or younger, your body mass index should be between 19-25. Your Body mass index is 28.35 kg/m. If this is out of the aformentioned range listed, please consider follow up with your Primary Care Provider.   We have sent the following medications to your pharmacy for you to pick up at your convenience: Omeprazole  [facility].  We will contact you with an appointment once the referral has been received.  Follow up with me on April 17, 2018 at 9:45 a.m.  Thank you for choosing me and [facility].   Hyacinth MeekerJennifer Lemmon, PA-C

## 2018-03-06 NOTE — Progress Notes (Addendum)
Chief Complaint: Abdominal pain  HPI:    Sierra Hodge is a 42 year old African-American female from [place], who follows with Dr. Leone Payor and presents to clinic today for complaint of abdominal pain.     05/30/2017 office visit Dr. Leone Payor described upper abdominal pain with bloating and distention with radiation into the back about 20 minutes after eating.  At that time an EGD and ultrasound were ordered.  Was also noted she had interstitial cystitis and there is a higher incidence of functional GI disturbances with this.    06/06/2017 ultrasound revealed cholelithiasis with no evidence of acute cholecystitis.    06/07/2017 EGD which was normal.  Ultrasound had revealed cholelithiasis and patient was referred to [facility] surgery for this as well as pain around her umbilicus status post hernia repair.    07/07/2017 lap choley.    Today, presents with her two young daughters, explains that after her gallbladder surgery she did okay for a period of time but then started with symptoms immediately after eating again.  Currently describes trapped gas and epigastric discomfort about 20 to 30 minutes after eating meals.  Tends to be worse with a big meal or fatty food.  Typically pain will be worse around the area of her bellybutton and can last for 60 minutes or so.  Occasionally uses Ketorolac which helps with this pain.  Sometimes has a bowel movement after this severe cramping pain.    Medical history positive for interstitial cystitis.  Patient describes pain below her bellybutton which she feels is related to this.  Social history positive for traveling throughout the summer, she has limited days when she can come in for follow-up.  Also requests that we try to help her over the next 2 weeks before she leaves on a trip to [place].    Denies fever, chills, blood in her stool, melena, weight loss, vomiting or symptoms that awaken her from sleep.  Past Medical History:  Diagnosis Date  .  Chronic interstitial cystitis   . Complication of anesthesia    hard to wake after anesthesia  . Endometriosis   . Headache   . Hyperlipidemia   . IUD (intrauterine device) in place    Mirena, Dr. Pennie Rushing  . Other psoriasis     Past Surgical History:  Procedure Laterality Date  . CESAREAN SECTION     2008, 2010  . CHOLECYSTECTOMY N/A 07/07/2017   Procedure: LAPAROSCOPIC CHOLECYSTECTOMY;  Surgeon: Abigail Miyamoto, MD;  Location: [facility];  Service: General;  Laterality: N/A;  . CYSTO WITH HYDRODISTENSION  03/29/2012   Procedure: CYSTOSCOPY/HYDRODISTENSION;  Surgeon: Martina Sinner, MD;  Location: [facility];  Service: Urology;  Laterality: N/A;  with Instillation of Marcaine and Pyridium   . LAPAROSCOPY  03/29/2012   Procedure: LAPAROSCOPY OPERATIVE;  Surgeon: Hal Morales, MD;  Location: [facility];  Service: Gynecology;  Laterality: N/A;  with Fulguration of Endometriosis and Peritoneal Biopsy  . TONSILLECTOMY    . TUBAL LIGATION  2010  . UMBILICAL HERNIA REPAIR  10/2010    Current Outpatient Medications  Medication Sig Dispense Refill  . albuterol (PROAIR HFA) 108 (90 Base) MCG/ACT inhaler Inhale 2 puffs into the lungs every 4 (four) hours as needed for wheezing or shortness of breath. (Patient not taking: Reported on 12/22/2017) 1 Inhaler 3  . azelastine (ASTELIN) 0.1 % nasal spray Place 2 sprays into both nostrils 2 (two) times daily as needed for rhinitis or allergies. Use in each nostril  as directed 30 mL 5  . Carbinoxamine Maleate 6 MG TABS Take 1 tablet by mouth See admin instructions. Every 6-8 hours as needed. 120 tablet 5  . cetirizine (ZYRTEC) 10 MG tablet Take 10 mg by mouth daily.    . clobetasol ointment (TEMOVATE) 0.05 % Apply topically daily as needed. 60 g 5  . fluticasone (FLONASE) 50 MCG/ACT nasal spray Place 2 sprays into both nostrils daily as needed for allergies or rhinitis. (Patient not taking: Reported on 11/08/2017) 16 g 5  . Fluticasone  Propionate (XHANCE) 93 MCG/ACT EXHU Place 2 puffs into the nose 2 (two) times daily. 32 mL 5  . levonorgestrel (MIRENA) 20 MCG/24HR IUD 1 each by Intrauterine route once.    . Omega-3 1000 MG CAPS Take by mouth.    . pentosan polysulfate (ELMIRON) 100 MG capsule Take 100 mg by mouth 2 (two) times daily. rx per gyn    . predniSONE (DELTASONE) 10 MG tablet 2 tabs a day x 5 days 10 tablet 0  . SUMAtriptan (IMITREX) 100 MG tablet Take 1 tablet (100 mg total) by mouth once as needed for up to 1 dose. May repeat in 2 hours if headache persists or recurs. (Patient not taking: Reported on 11/08/2017) 10 tablet 12  . Vitamin E 100 units TABS Take by mouth.     No current facility-administered medications for this visit.    Facility-Administered Medications Ordered in Other Visits  Medication Dose Route Frequency Provider Last Rate Last Dose  . gadopentetate dimeglumine (MAGNEVIST) injection 13 mL  13 mL Intravenous Once PRN Anson Fret, MD        Allergies as of 03/06/2018 - Review Complete 12/22/2017  Allergen Reaction Noted  . Morphine and related Itching 10/18/2011  . Meloxicam Other (See Comments)     Family History  Problem Relation Age of Onset  . Hyperlipidemia Mother   . Migraines Mother   . Heart disease Other        paternal family  . Diabetes Paternal Uncle   . Hyperlipidemia Sister   . Colon cancer Neg Hx   . Breast cancer Neg Hx     Social History   Socioeconomic History  . Marital status: Married    Spouse name: Not on file  . Number of children: 2  . Years of education: 53  . Highest education level: Not on file  Occupational History  . Occupation: Medical interpreter    Comment: Spanish  Social Needs  . Financial resource strain: Not on file  . Food insecurity:    Worry: Not on file    Inability: Not on file  . Transportation needs:    Medical: Not on file    Non-medical: Not on file  Tobacco Use  . Smoking status: Never Smoker  . Smokeless tobacco:  Never Used  Substance and Sexual Activity  . Alcohol use: Yes    Alcohol/week: 0.6 oz    Types: 1 Glasses of wine per week    Comment: rarely   . Drug use: No  . Sexual activity: Yes    Partners: Male    Birth control/protection: Surgical    Comment: BTL   Lifestyle  . Physical activity:    Days per week: Not on file    Minutes per session: Not on file  . Stress: Not on file  Relationships  . Social connections:    Talks on phone: Not on file    Gets together: Not on file  Attends religious service: Not on file    Active member of club or organization: Not on file    Attends meetings of clubs or organizations: Not on file    Relationship status: Not on file  . Intimate partner violence:    Fear of current or ex partner: Not on file    Emotionally abused: Not on file    Physically abused: Not on file    Forced sexual activity: Not on file  Other Topics Concern  . Not on file  Social History Narrative   Married    Employed as a Research officer, trade union,  mainly medical   From [place] ; 2 daughters 2008, 2010         Review of Systems:    Constitutional: No weight loss, fever or chills Cardiovascular: No chest pain  Respiratory: No SOB  Gastrointestinal: See HPI and otherwise negative   Physical Exam:  Vital signs: BP 90/66 (BP Location: Left Arm, Patient Position: Sitting, Cuff Size: Normal)   Pulse 84   Ht 4' 10.5" (1.486 m)   Wt 138 lb (62.6 kg)   BMI 28.35 kg/m    Constitutional:   Pleasant female appears to be in NAD, Well developed, Well nourished, alert and cooperative Respiratory: Respirations even and unlabored. Lungs clear to auscultation bilaterally.   No wheezes, crackles, or rhonchi.  Cardiovascular: Normal S1, S2. No MRG. Regular rate and rhythm. No peripheral edema, cyanosis or pallor.  Gastrointestinal:  Soft, nondistended, mild epigastric and suprapubic pain. No rebound or guarding. Normal bowel sounds. No appreciable masses or  hepatomegaly. Psychiatric:  Demonstrates good judgement and reason without abnormal affect or behaviors.  MOST RECENT LABS AND IMAGING: CBC    Component Value Date/Time   WBC 8.0 04/18/2017 1440   RBC 4.58 04/18/2017 1440   HGB 13.6 04/18/2017 1440   HCT 41.0 04/18/2017 1440   PLT 230.0 04/18/2017 1440   MCV 89.5 04/18/2017 1440   MCH 28.9 03/20/2012 1600   MCHC 33.1 04/18/2017 1440   RDW 13.8 04/18/2017 1440   LYMPHSABS 2.1 04/18/2017 1440   MONOABS 0.6 04/18/2017 1440   EOSABS 0.1 04/18/2017 1440   BASOSABS 0.0 04/18/2017 1440    CMP     Component Value Date/Time   NA 137 10/03/2017 1228   K 3.6 10/03/2017 1228   CL 103 10/03/2017 1228   CO2 29 10/03/2017 1228   GLUCOSE 95 10/03/2017 1228   BUN 10 10/03/2017 1228   CREATININE 0.51 10/03/2017 1228   CREATININE 0.78 02/01/2014 1511   CALCIUM 9.0 10/03/2017 1228   PROT 7.2 10/03/2017 1228   ALBUMIN 4.5 10/03/2017 1228   AST 17 10/03/2017 1228   ALT 21 10/03/2017 1228   ALKPHOS 55 10/03/2017 1228   BILITOT 1.0 10/03/2017 1228    Assessment: 1.  Epigastric pain: Onset of relation to gastritis +/- bile reflux versus IBS/functional dyspepsia 2. Eructations  Plan: 1.  Prescribed Omeprazole 40 mg daily, 30-60 minutes before lunch as patient takes her interstitial cystitis medication in the morning.  #30 with 3 refills 2.  Discussed smaller meals, low in fat to help prevent symptoms.  Patient requests referral to nutritionist. 3.  My nurse will call the patient in 1 week, if she is not any better with Omeprazole would recommend a trial of Hyoscyamine 3-4 times a day 20-30 minutes before meals and at bedtime. 4.  Patient to follow in clinic on July 29 as this is the only day she has available in the  next few months for follow-up.  Hyacinth MeekerJennifer Lemmon, PA-C [facility] 03/06/2018, 9:53 AM   Agree with Ms. Lenard SimmerLemmon's evaluation and management.  Would also consider trying FD Delene RuffiniGard vs IB Delene RuffiniGard - would try FD Delene RuffiniGard  first  Iva Booparl E. Gessner, MD, Clementeen GrahamFACG    Cc: Wanda PlumpPaz, Jose E, MD

## 2018-03-13 ENCOUNTER — Encounter: Payer: Self-pay | Admitting: Dietician

## 2018-03-13 ENCOUNTER — Encounter: Payer: BLUE CROSS/BLUE SHIELD | Attending: Physician Assistant | Admitting: Dietician

## 2018-03-13 DIAGNOSIS — Z9049 Acquired absence of other specified parts of digestive tract: Secondary | ICD-10-CM | POA: Insufficient documentation

## 2018-03-13 DIAGNOSIS — R634 Abnormal weight loss: Secondary | ICD-10-CM | POA: Diagnosis not present

## 2018-03-13 DIAGNOSIS — Z713 Dietary counseling and surveillance: Secondary | ICD-10-CM | POA: Diagnosis not present

## 2018-03-13 DIAGNOSIS — N3010 Interstitial cystitis (chronic) without hematuria: Secondary | ICD-10-CM

## 2018-03-13 NOTE — Progress Notes (Signed)
  Medical Nutrition Therapy:  Appt start time: 1530 end time:  1620.   Assessment:  Primary concerns today: Patient is here today alone.  She has lost 4 lbs since cholecystectomy surgery  10/18.  She is eating well and wants to lose weight.  She does not like or agree with diets but has been drinking a "slim tea" that has caused diarrhea.  She also complains of abdominal pain at times after eating.  History include high cholesterol and interstitial cystitis.   Labs:  Cholesterol:  207, HDL 46, LDL 143, Triglycerides 88 (06/27/18) Weight hx:  Less than 100 lbs at age 42 when she moved here from [place].  She gained weight when she was given birth control pills to help with her endometriosis to a high of 146 lbs.  Patient lives with her husband and 549 and 44 yo daughters.  She works part time as a Teacher, English as a foreign languagepanish interpretor.    Preferred Learning Style:   No preference indicated   Learning Readiness:   Ready  Change in progress   MEDICATIONS: see list   DIETARY INTAKE:  Usual eating pattern includes 3 meals and 0 snacks per day. Avoided foods include Coffee, soda, OJ and other juice, citrus fruit, berries, tomatoes (except occasional 1 slice fresh), tea, green tea.    24-hr recall:  B ( AM): 2 eggs, 2 slices [employer] bread, fruit Snk ( AM): none  L (1:30-2 PM): hamburgers (homemade with lettuce, tomato, mayo, cheese) OR apple only today as she was so full from breakfast OR rice, tuna, regular mayo, cilantro OR chicken, rice, black beans, cilantro Snk ( PM): none D ( PM): rice, pork or chicken or ground beef OR tacos AND cucumbers, carrots or other vegetables Snk ( PM): none Beverages: water, mint tea  Usual physical activity: walks the dog for 20 minutes each day  Estimated energy needs: 1400 calories 105 g protein 39 g fat  Progress Towards Goal(s):  In progress.   Nutritional Diagnosis:  NB-1.1 Food and nutrition-related knowledge deficit As related to fat modified diet for  reduction of cholesterol and abdominal pain.  As evidenced by diet hx and patient report.    Intervention:  Nutrition counseling/education related to low fat eating, mindfulness to help with quantity consumed and choices, and foods provided a new list of foods for interstitial cystitis as she states that she lost her book.  She states that coffee causes her pain so should avoid.  Discussed benefits of activity.  Choose foods low in fat  Baked, boiled, roasted and not fried  Choose lean meat and choose fish and beans more often as protein.  Remove the skin from the chicken and remove fat from the meat.  Only small amounts of cheese  Avoid added oil, butter, mayo  Avoid high fat dairy productswalk Avoid coffee Eat very slowly and chew your food well.  Stop eating when you are satisfied. Find ways to be more active.  Teaching Method Utilized:  Visual Auditory  Handouts given during visit include:  What to eat with IC   Low fat nutrition therapy  Cholesterol and triglycerides  Barriers to learning/adherence to lifestyle change: none  Demonstrated degree of understanding via:  Teach Back   Monitoring/Evaluation:  Dietary intake, exercise, and body weight 2-3 months.

## 2018-03-13 NOTE — Patient Instructions (Addendum)
Choose foods low in fat  Baked, boiled, roasted and not fried  Choose lean meat and choose fish and beans more often as protein.  Remove the skin from the chicken and remove fat from the meat.  Only small amounts of cheese  Avoid added oil, butter, mayo  Avoid high fat dairy productswalk Avoid coffee Eat very slowly and chew your food well.  Stop eating when you are satisfied. Find ways to be more active.

## 2018-03-26 ENCOUNTER — Encounter: Payer: Self-pay | Admitting: Physician Assistant

## 2018-04-17 ENCOUNTER — Ambulatory Visit: Payer: BLUE CROSS/BLUE SHIELD | Admitting: Physician Assistant

## 2018-05-01 ENCOUNTER — Ambulatory Visit: Payer: BLUE CROSS/BLUE SHIELD | Admitting: Neurology

## 2018-05-01 ENCOUNTER — Encounter: Payer: Self-pay | Admitting: Neurology

## 2018-05-01 VITALS — BP 92/58 | HR 77 | Ht 58.5 in | Wt 141.0 lb

## 2018-05-01 DIAGNOSIS — R0981 Nasal congestion: Secondary | ICD-10-CM

## 2018-05-01 DIAGNOSIS — G43009 Migraine without aura, not intractable, without status migrainosus: Secondary | ICD-10-CM | POA: Diagnosis not present

## 2018-05-01 MED ORDER — RIZATRIPTAN BENZOATE 10 MG PO TBDP: 10.0000 mg | ORAL_TABLET | ORAL | 11 refills | Status: DC | PRN

## 2018-05-01 NOTE — Progress Notes (Signed)
[facility]    Provider:  Dr Lucia GaskinsAhern Referring Provider: Wanda PlumpPaz, Jose E, MD Primary Care Physician:  Wanda PlumpPaz, Jose E, MD  CC:  Headaches  Interval history 05/01/2018: reviewed MRI brain images with patient, was normal.  She is still having a headache and she is sick. She saw Dr. Haroldine Lawsrossley and he sent her to allergist.  She had a few migraines in the summer, ibuprofen works. Headaches are in the setting of congestion and allergies and sinus infections. She declines a migraine preventative. She declines trying anything else except ibuprofen acutely, then agrees to try maxalt.   Migraine medications tried: ibuprofen, tramadol, zofran, ketorolac, prednisone taper, phenergan, imitrex(did not work)   HPI:  Sierra Hodge is a 42 y.o. female here as a referral from Dr. Drue NovelPaz for headaches. She feels like she has headaches and thought it was sinus related. She has had multiple sinus infections and was treated with multiple rounds of antibitoics. Headaches worsening for the last 2 years. They "come and go". Ibuprofen helps. The headache is on the top of the head. She takes ibuprofen 8x a month. Headaches can be severe, she has temple pain pulsating, eye twitches, vision worsens, she has light sensitivity, she has had 2 severe headaches and one lasted up to a week. She had associated dizziness. Headache is positional. She has ear pain. Mohter has migraines.  Sister has migraines too. She currently has sinus pain and congestion. +vision changes. No aura. No medication overuse. No other focal neurologic deficits, associated symptoms, inciting events or modifiable factors.  Reviewed notes, labs and imaging from outside physicians, which showed:  TSH nml  CT maxillofacial 06/27/2017: personally reviewed and agree with the following:  1. Symmetric nasal cavity mucosal thickening raising the possibility of Rhinitis. Only mild bilateral maxillary sinus mucosal thickening, and the remaining paranasal  sinuses, the middle ears and mastoids are clear.  2. No significant dental disease identified. No acute osseous abnormality.  3. Negative visualized noncontrast face soft tissues and brain parenchyma.  Review of Systems: Patient complains of symptoms per HPI as well as the following symptoms: blurred vision, headache, dizziness, joint pain, allergies. Pertinent negatives and positives per HPI. All others negative.   Social History   Socioeconomic History  . Marital status: Married    Spouse name: Not on file  . Number of children: 2  . Years of education: 7916  . Highest education level: Not on file  Occupational History  . Occupation: Medical interpreter    Comment: Spanish  Social Needs  . Financial resource strain: Not on file  . Food insecurity:    Worry: Not on file    Inability: Not on file  . Transportation needs:    Medical: Not on file    Non-medical: Not on file  Tobacco Use  . Smoking status: Never Smoker  . Smokeless tobacco: Never Used  Substance and Sexual Activity  . Alcohol use: Yes    Comment: rarely   . Drug use: No  . Sexual activity: Yes    Partners: Male    Birth control/protection: IUD, Surgical    Comment: BTL   Lifestyle  . Physical activity:    Days per week: Not on file    Minutes per session: Not on file  . Stress: Not on file  Relationships  . Social connections:    Talks on phone: Not on file    Gets together: Not on file    Attends religious service: Not on file  Active member of club or organization: Not on file    Attends meetings of clubs or organizations: Not on file    Relationship status: Not on file  . Intimate partner violence:    Fear of current or ex partner: Not on file    Emotionally abused: Not on file    Physically abused: Not on file    Forced sexual activity: Not on file  Other Topics Concern  . Not on file  Social History Narrative   Married    Employed as a Research officer, trade union,  mainly medical   From [place] ; 2 daughters 2008, 2010        Right handed   Caffeine: 1 or 2 cups a week    Family History  Problem Relation Age of Onset  . Hyperlipidemia Mother   . Migraines Mother   . Heart disease Other        paternal family  . Diabetes Paternal Uncle   . Hyperlipidemia Sister   . Colon cancer Neg Hx   . Breast cancer Neg Hx     Past Medical History:  Diagnosis Date  . Chronic interstitial cystitis   . Complication of anesthesia    hard to wake after anesthesia  . Endometriosis   . Headache   . Hyperlipidemia   . IUD (intrauterine device) in place    Mirena, Dr. Pennie Rushing  . Other psoriasis     Past Surgical History:  Procedure Laterality Date  . CESAREAN SECTION     2008, 2010  . CHOLECYSTECTOMY N/A 07/07/2017   Procedure: LAPAROSCOPIC CHOLECYSTECTOMY;  Surgeon: Abigail Miyamoto, MD;  Location: [facility];  Service: General;  Laterality: N/A;  . CYSTO WITH HYDRODISTENSION  03/29/2012   Procedure: CYSTOSCOPY/HYDRODISTENSION;  Surgeon: Martina Sinner, MD;  Location: [facility];  Service: Urology;  Laterality: N/A;  with Instillation of Marcaine and Pyridium   . ESOPHAGOGASTRODUODENOSCOPY  2018  . LAPAROSCOPY  03/29/2012   Procedure: LAPAROSCOPY OPERATIVE;  Surgeon: Hal Morales, MD;  Location: [facility];  Service: Gynecology;  Laterality: N/A;  with Fulguration of Endometriosis and Peritoneal Biopsy  . TONSILLECTOMY    . TUBAL LIGATION  2010  . UMBILICAL HERNIA REPAIR  10/2010    Current Outpatient Medications  Medication Sig Dispense Refill  . Carbinoxamine Maleate 6 MG TABS Take 1 tablet by mouth See admin instructions. Every 6-8 hours as needed. (Patient taking differently: Take 1.5 tablets by mouth See admin instructions. Every 6-8 hours as needed.) 120 tablet 5  . clobetasol ointment (TEMOVATE) 0.05 % Apply topically daily as needed. 60 g 5  . ibuprofen (ADVIL,MOTRIN) 200 MG tablet Take 400-600 mg by mouth as needed.    Marland Kitchen levonorgestrel (MIRENA) 20  MCG/24HR IUD 1 each by Intrauterine route once.    . Omega-3 1000 MG CAPS Take by mouth.    . pentosan polysulfate (ELMIRON) 100 MG capsule Take 100 mg by mouth 2 (two) times daily. rx per gyn    . Vitamin E 100 units TABS Take by mouth.    Marland Kitchen omeprazole (PRILOSEC) 40 MG capsule Take 1 capsule (40 mg total) by mouth daily. Take 30-60 minutes before lunch. (Patient not taking: Reported on 03/13/2018) 30 capsule 2   No current facility-administered medications for this visit.    Facility-Administered Medications Ordered in Other Visits  Medication Dose Route Frequency Provider Last Rate Last Dose  . gadopentetate dimeglumine (MAGNEVIST) injection 13 mL  13 mL Intravenous Once PRN Naomie Dean  B, MD        Allergies as of 05/01/2018 - Review Complete 05/01/2018  Allergen Reaction Noted  . Morphine and related Itching 10/18/2011  . Mobic [meloxicam] Other (See Comments)     Vitals: Ht 4' 10.5" (1.486 m)   Wt 141 lb (64 kg)   BMI 28.97 kg/m  Last Weight:  Wt Readings from Last 1 Encounters:  05/01/18 141 lb (64 kg)   Last Height:   Ht Readings from Last 1 Encounters:  05/01/18 4' 10.5" (1.486 m)    Physical exam: Exam: Gen: NAD, conversant, well nourised, well groomed                     CV: RRR, no MRG. No Carotid Bruits. No peripheral edema, warm, nontender Eyes: Conjunctivae clear without exudates or hemorrhage  Neuro: Detailed Neurologic Exam  Speech:    Speech is normal; fluent and spontaneous with normal comprehension.  Cognition:    The patient is oriented to person, place, and time;     recent and remote memory intact;     language fluent;     normal attention, concentration,     fund of knowledge Cranial Nerves:    The pupils are equal, round, and reactive to light. The fundi are normal and spontaneous venous pulsations are present. Visual fields are full to finger confrontation. Extraocular movements are intact. Trigeminal sensation is intact and the muscles of  mastication are normal. The face is symmetric. The palate elevates in the midline. Hearing intact. Voice is normal. Shoulder shrug is normal. The tongue has normal motion without fasciculations.   Coordination:    Normal finger to nose and heel to shin. Normal rapid alternating movements.   Gait:    Heel-toe and tandem gait are normal.   Motor Observation:    No asymmetry, no atrophy, and no involuntary movements noted. Tone:    Normal muscle tone.    Posture:    Posture is normal. normal erect    Strength:    Strength is V/V in the upper and lower limbs.      Sensation: intact to LT     Reflex Exam:  DTR's:    Deep tendon reflexes in the upper and lower extremities are normal bilaterally.   Toes:    The toes are downgoing bilaterally.   Clonus:    Clonus is absent.      Assessment/Plan:  42 year old patient with migraines. However the headaches are positional with vision changes so need to have an MRI of the brain to evaluate for other intracranial causes.  She usually has headaches in the setting of sinus congestion. She is here today with congestion and ear drainage. I have already referred her to Dr. Haroldine Laws ENT, she has an allergist and a pcp. I really don;t treat sinus congestion and infections not sure how to help her.  She declines a migraine preventative. She declines trying anything else except ibuprofen acutely. I am not sure I have anything more to offer, I can;t treat her allergies and sinus infections. She did agree to try Maxalt at end of appointment, will prescribe for acute management  F/u with Dr. Haroldine Laws tomorrow.   Sinus congestion: try nettie pot, worsening  Migraines: stable  MRI: normal   Discussed: To prevent or relieve headaches, try the following: Cool Compress. Lie down and place a cool compress on your head.  Avoid headache triggers. If certain foods or odors seem to have triggered  your migraines in the past, avoid them. A headache diary  might help you identify triggers.  Include physical activity in your daily routine. Try a daily walk or other moderate aerobic exercise.  Manage stress. Find healthy ways to cope with the stressors, such as delegating tasks on your to-do list.  Practice relaxation techniques. Try deep breathing, yoga, massage and visualization.  Eat regularly. Eating regularly scheduled meals and maintaining a healthy diet might help prevent headaches. Also, drink plenty of fluids.  Follow a regular sleep schedule. Sleep deprivation might contribute to headaches Consider biofeedback. With this mind-body technique, you learn to control certain bodily functions - such as muscle tension, heart rate and blood pressure - to prevent headaches or reduce headache pain.    Proceed to emergency room if you experience new or worsening symptoms or symptoms do not resolve, if you have new neurologic symptoms or if headache is severe, or for any concerning symptom.   Provided education and documentation from American headache Society toolbox including articles on: chronic migraine medication overuse headache, chronic migraines, prevention of migraines, behavioral and other nonpharmacologic treatments for headache.  Meds ordered this encounter  Medications  . rizatriptan (MAXALT-MLT) 10 MG disintegrating tablet    Sig: Take 1 tablet (10 mg total) by mouth as needed for migraine. May repeat in 2 hours if needed. Max 2x a day.    Dispense:  9 tablet    Refill:  11    Naomie DeanAntonia Aurelio Mccamy, MD   A total of 25 minutes was spent face-to-face with this patient. Over half this time was spent on counseling patient on the  1. Migraine without aura and without status migrainosus, not intractable   2. Sinus congestion     diagnosis and different diagnostic and therapeutic options, counseling and coordination of care, risks ans benefits of management, compliance, or risk factor reduction and education.    [facility] [address] [place], [place] [id number]  Phone [phone] Fax [phone]

## 2018-05-01 NOTE — Patient Instructions (Signed)
Rizatriptan(Maxalt): Please take one tablet at the onset of your headache. If it does not improve the symptoms please take one additional tablet. Do not take more then 2 tablets in 24hrs. Do not take use more then 2 to 3 times in a week.  Rizatriptan disintegrating tablets What is this medicine? RIZATRIPTAN (rye za TRIP tan) is used to treat migraines with or without aura. An aura is a strange feeling or visual disturbance that warns you of an attack. It is not used to prevent migraines. This medicine may be used for other purposes; ask your health care provider or pharmacist if you have questions. COMMON BRAND NAME(S): Maxalt-MLT What should I tell my health care provider before I take this medicine? They need to know if you have any of these conditions: -bowel disease or colitis -diabetes -family history of heart disease -fast or irregular heart beat -heart or blood vessel disease, angina (chest pain), or previous heart attack -high blood pressure -high cholesterol -history of stroke, transient ischemic attacks (TIAs or mini-strokes), or intracranial bleeding -kidney or liver disease -overweight -poor circulation -postmenopausal or surgical removal of uterus and ovaries -an unusual or allergic reaction to rizatriptan, other medicines, foods, dyes, or preservatives -pregnant or trying to get pregnant -breast-feeding How should I use this medicine? Take this medicine by mouth. Follow the directions on the prescription label. This medicine is taken at the first symptoms of a migraine. It is not for everyday use. Leave the tablet in the foil package until you are ready to take it. Do not push the tablet through the blister pack. Peel open the blister pack with dry hands and place the tablet on your tongue. The tablet will dissolve rapidly and be swallowed in your saliva. It is not necessary to drink any water to take this medicine. If your migraine headache returns after one dose, you can take  another dose as directed. You must leave at least 2 hours between doses, and do not take more than 30 mg total in 24 hours. If there is no improvement at all after the first dose, do not take a second dose without talking to your doctor or health care professional. Do not take your medicine more often than directed. Talk to your pediatrician regarding the use of this medicine in children. While this drug may be prescribed for children as young as 6 years for selected conditions, precautions do apply. Overdosage: If you think you have taken too much of this medicine contact a poison control center or emergency room at once. NOTE: This medicine is only for you. Do not share this medicine with others. What if I miss a dose? This does not apply; this medicine is not for regular use. What may interact with this medicine? Do not take this medicine with any of the following medicines: -amphetamine, dextroamphetamine or cocaine -dihydroergotamine, ergotamine, ergoloid mesylates, methysergide, or ergot-type medication - do not take within 24 hours of taking rizatriptan -feverfew -MAOIs like Carbex, Eldepryl, Marplan, Nardil, and Parnate - do not take rizatriptan within 2 weeks of stopping MAOI therapy. -other migraine medicines like almotriptan, eletriptan, naratriptan, sumatriptan, zolmitriptan - do not take within 24 hours of taking rizatriptan -tryptophan This medicine may also interact with the following medications: -medicines for mental depression, anxiety or mood problems -propranolol This list may not describe all possible interactions. Give your health care provider a list of all the medicines, herbs, non-prescription drugs, or dietary supplements you use. Also tell them if you smoke, drink alcohol,  or use illegal drugs. Some items may interact with your medicine. What should I watch for while using this medicine? Only take this medicine for a migraine headache. Take it if you get warning symptoms  or at the start of a migraine attack. It is not for regular use to prevent migraine attacks. You may get drowsy or dizzy. Do not drive, use machinery, or do anything that needs mental alertness until you know how this medicine affects you. To reduce dizzy or fainting spells, do not sit or stand up quickly, especially if you are an older patient. Alcohol can increase drowsiness, dizziness and flushing. Avoid alcoholic drinks. Smoking cigarettes may increase the risk of heart-related side effects from using this medicine. If you take migraine medicines for 10 or more days a month, your migraines may get worse. Keep a diary of headache days and medicine use. Contact your healthcare professional if your migraine attacks occur more frequently. What side effects may I notice from receiving this medicine? Side effects that you should report to your doctor or health care professional as soon as possible: -allergic reactions like skin rash, itching or hives, swelling of the face, lips, or tongue -fast, slow, or irregular heart beat -increased or decreased blood pressure -seizures -severe stomach pain and cramping, bloody diarrhea -signs and symptoms of a blood clot such as breathing problems; changes in vision; chest pain; severe, sudden headache; pain, swelling, warmth in the leg; trouble speaking; sudden numbness or weakness of the face, arm or leg -tingling, pain, or numbness in the face, hands, or feet Side effects that usually do not require medical attention (report to your doctor or health care professional if they continue or are bothersome): -drowsiness -dry mouth -feeling warm, flushing, or redness of the face -headache -muscle cramps, pain -nausea, vomiting -unusually weak or tired This list may not describe all possible side effects. Call your doctor for medical advice about side effects. You may report side effects to FDA at 1-800-FDA-1088. Where should I keep my medicine? Keep out of the  reach of children. Store at room temperature between 15 and 30 degrees C (59 and 86 degrees F). Protect from light and moisture. Throw away any unused medicine after the expiration date. NOTE: This sheet is a summary. It may not cover all possible information. If you have questions about this medicine, talk to your doctor, pharmacist, or health care provider.  2018 Elsevier/Gold Standard (2013-05-08 10:17:42)   Sinus Rinse What is a sinus rinse? A sinus rinse is a simple home treatment that is used to rinse your sinuses with a sterile mixture of salt and water (saline solution). Sinuses are air-filled spaces in your skull behind the bones of your face and forehead that open into your nasal cavity. You will use the following:  Saline solution.  Neti pot or spray bottle. This releases the saline solution into your nose and through your sinuses. Neti pots and spray bottles can be purchased at [employer], a health food store, or online.  When would I do a sinus rinse? A sinus rinse can help to clear mucus, dirt, dust, or pollen from the nasal cavity. You may do a sinus rinse when you have a cold, a virus, nasal allergy symptoms, a sinus infection, or stuffiness in the nose or sinuses. If you are considering a sinus rinse:  Ask your child's health care provider before performing a sinus rinse on your child.  Do not do a sinus rinse if you have had  ear or nasal surgery, ear infection, or blocked ears.  How do I do a sinus rinse?  Wash your hands.  Disinfect your device according to the directions provided and then dry it.  Use the solution that comes with your device or one that is sold separately in stores. Follow the mixing directions on the package.  Fill your device with the amount of saline solution as directed by the device instructions.  Stand over a sink and tilt your head sideways over the sink.  Place the spout of the device in your upper nostril (the one closer to  the ceiling).  Gently pour or squeeze the saline solution into the nasal cavity. The liquid should drain to the lower nostril if you are not overly congested.  Gently blow your nose. Blowing too hard may cause ear pain.  Repeat in the other nostril.  Clean and rinse your device with clean water and then air-dry it. Are there risks of a sinus rinse? Sinus rinse is generally very safe and effective. However, there are a few risks, which include:  A burning sensation in the sinuses. This may happen if you do not make the saline solution as directed. Make sure to follow all directions when making the saline solution.  Infection from contaminated water. This is rare, but possible.  Nasal irritation.  This information is not intended to replace advice given to you by your health care provider. Make sure you discuss any questions you have with your health care provider. Document Released: 04/03/2014 Document Revised: 08/03/2016 Document Reviewed: 01/22/2014 Elsevier Interactive Patient Education  2017 [employer]

## 2018-05-17 ENCOUNTER — Ambulatory Visit: Payer: BLUE CROSS/BLUE SHIELD | Admitting: Internal Medicine

## 2018-05-29 ENCOUNTER — Ambulatory Visit: Payer: BLUE CROSS/BLUE SHIELD | Admitting: Dietician

## 2018-05-30 ENCOUNTER — Other Ambulatory Visit: Payer: Self-pay | Admitting: Allergy and Immunology

## 2018-05-30 MED ORDER — CARBINOXAMINE MALEATE 6 MG PO TABS: 1.0000 | ORAL_TABLET | ORAL | 5 refills | Status: DC

## 2018-05-30 NOTE — Telephone Encounter (Signed)
[facility]

## 2018-05-30 NOTE — Telephone Encounter (Signed)
Patient is requesting a prescription for carbinoxamine, to be sent to [facility]; says it is cheaper.

## 2018-05-31 ENCOUNTER — Other Ambulatory Visit: Payer: Self-pay | Admitting: *Deleted

## 2018-05-31 MED ORDER — CARBINOXAMINE MALEATE 6 MG PO TABS: 1.0000 | ORAL_TABLET | ORAL | 0 refills | Status: DC

## 2018-06-06 ENCOUNTER — Encounter: Payer: Self-pay | Admitting: Allergy and Immunology

## 2018-06-06 ENCOUNTER — Ambulatory Visit (INDEPENDENT_AMBULATORY_CARE_PROVIDER_SITE_OTHER): Payer: BLUE CROSS/BLUE SHIELD | Admitting: Allergy and Immunology

## 2018-06-06 VITALS — BP 98/66 | HR 80 | Ht 62.0 in | Wt 140.0 lb

## 2018-06-06 DIAGNOSIS — J4520 Mild intermittent asthma, uncomplicated: Secondary | ICD-10-CM | POA: Diagnosis not present

## 2018-06-06 DIAGNOSIS — J3089 Other allergic rhinitis: Secondary | ICD-10-CM | POA: Diagnosis not present

## 2018-06-06 DIAGNOSIS — J0101 Acute recurrent maxillary sinusitis: Secondary | ICD-10-CM

## 2018-06-06 MED ORDER — FLUTICASONE PROPIONATE 93 MCG/ACT NA EXHU: 2.0000 | INHALANT_SUSPENSION | Freq: Two times a day (BID) | NASAL | 5 refills | Status: DC

## 2018-06-06 MED ORDER — ALBUTEROL SULFATE HFA 108 (90 BASE) MCG/ACT IN AERS: 2.0000 | INHALATION_SPRAY | Freq: Four times a day (QID) | RESPIRATORY_TRACT | 2 refills | Status: DC | PRN

## 2018-06-06 MED ORDER — CARBINOXAMINE MALEATE 4 MG PO TABS: ORAL_TABLET | ORAL | 5 refills | Status: DC

## 2018-06-06 MED ORDER — AZELASTINE HCL 0.15 % NA SOLN: 2.0000 | Freq: Two times a day (BID) | NASAL | 5 refills | Status: DC

## 2018-06-06 NOTE — Assessment & Plan Note (Addendum)
   Start albuterol HFA, 1-2 inhalations every 6 hours if needed and 10-15 minutes prior to exercise.  Subjective and objective measures of pulmonary function will be followed and the treatment plan will be adjusted accordingly.

## 2018-06-06 NOTE — Assessment & Plan Note (Signed)
Perennial allergic rhinitis with a possible nonallergic component.  Continue appropriate allergen avoidance measures, carbinoxamine every as needed, Xhance, 2 actuations per nostril twice a day.   If needed, add azelastine nasal spray, 2 sprays per nostril 1-2 times daily as needed. A prescription has been provided.  Nasal saline lavage (NeilMed) has been recommended as needed and prior to medicated nasal sprays along with instructions for proper administration.  For sinus pressure, add pseudoephedrine as needed. Pseudoephedrine is only to be used for short-term relief of nasal/sinus congestion. Long-term use is discouraged due to potential side effects.  If allergen avoidance measures and medications fail to adequately relieve symptoms, aeroallergen immunotherapy will be considered.

## 2018-06-06 NOTE — Patient Instructions (Addendum)
Mild intermittent asthma/exercise-induced bronchospasm  Start albuterol HFA, 1-2 inhalations every 6 hours if needed and 10-15 minutes prior to exercise.  Subjective and objective measures of pulmonary function will be followed and the treatment plan will be adjusted accordingly.  Perennial allergic rhinitis Perennial allergic rhinitis with a possible nonallergic component.  Continue appropriate allergen avoidance measures, carbinoxamine every as needed, Xhance, 2 actuations per nostril twice a day.   If needed, add azelastine nasal spray, 2 sprays per nostril 1-2 times daily as needed. A prescription has been provided.  Nasal saline lavage (NeilMed) has been recommended as needed and prior to medicated nasal sprays along with instructions for proper administration.  For sinus pressure, add pseudoephedrine as needed. Pseudoephedrine is only to be used for short-term relief of nasal/sinus congestion. Long-term use is discouraged due to potential side effects.  If allergen avoidance measures and medications fail to adequately relieve symptoms, aeroallergen immunotherapy will be considered.  Recurrent maxillary sinusitis  Treatment plan as outlined above for allergic rhinitis.   Return in about 5 months, or if symptoms worsen or fail to improve.

## 2018-06-06 NOTE — Progress Notes (Signed)
Follow-up Note  RE: Sierra Hodge MRN: [id number] DOB: [birth year] Date of Office Visit: 06/06/2018  Primary care provider: Wanda Plump, MD Referring provider: Keturah Barre, MD  History of present illness: Sierra Hodge is a 42 y.o. female with asthma and rhinosinusitis presenting today for follow-up.  She was previously seen in this clinic for her initial evaluation on November 08, 2017. She reports that she is still experiencing occasional nasal congestion sinus pressure despite compliance with Xhance nasal spray and carbinoxamine as needed.  Has her sinus symptoms tend to be worse during the fall she is concerned about potential progression of symptoms in the upcoming month or two. She notes that she has been experiencing some shortness of breath in association with rhinosinusitis symptoms, however admits that she did not pick up albuterol from the pharmacy.  Assessment and plan: Mild intermittent asthma/exercise-induced bronchospasm  Start albuterol HFA, 1-2 inhalations every 6 hours if needed and 10-15 minutes prior to exercise.  Subjective and objective measures of pulmonary function will be followed and the treatment plan will be adjusted accordingly.  Perennial allergic rhinitis Perennial allergic rhinitis with a possible nonallergic component.  Continue appropriate allergen avoidance measures, carbinoxamine every as needed, Xhance, 2 actuations per nostril twice a day.   If needed, add azelastine nasal spray, 2 sprays per nostril 1-2 times daily as needed. A prescription has been provided.  Nasal saline lavage (NeilMed) has been recommended as needed and prior to medicated nasal sprays along with instructions for proper administration.  For sinus pressure, add pseudoephedrine as needed. Pseudoephedrine is only to be used for short-term relief of nasal/sinus congestion. Long-term use is discouraged due to potential side effects.  If allergen avoidance measures and  medications fail to adequately relieve symptoms, aeroallergen immunotherapy will be considered.  Recurrent maxillary sinusitis  Treatment plan as outlined above for allergic rhinitis.   Meds ordered this encounter  Medications  . albuterol (PROVENTIL HFA;VENTOLIN HFA) 108 (90 Base) MCG/ACT inhaler    Sig: Inhale 2 puffs into the lungs every 6 (six) hours as needed for wheezing or shortness of breath.    Dispense:  1 Inhaler    Refill:  2  . Fluticasone Propionate (XHANCE) 93 MCG/ACT EXHU    Sig: Place 2 sprays into the nose 2 (two) times daily.    Dispense:  32 mL    Refill:  5    [phone] (M)  . Carbinoxamine Maleate 4 MG TABS    Sig: TAKE 1 & 1/2 TABLET BY MOUTH EVERY 6 HOURS AS NEEDED    Dispense:  28 each    Refill:  5  . Azelastine HCl 0.15 % SOLN    Sig: Place 2 sprays into both nostrils 2 (two) times daily.    Dispense:  30 mL    Refill:  5    Diagnostics: Spirometry:  Normal with an FEV1 of 124% predicted. This study was performed while the patient was asymptomatic.  Please see scanned spirometry results for details.    Physical examination: Blood pressure 98/66, pulse 80, height 5\' 2"  (1.575 m), weight 140 lb (63.5 kg), SpO2 96 %.  General: Alert, interactive, in no acute distress. HEENT: TMs pearly gray, turbinates moderately edematous without discharge, post-pharynx mildly erythematous. Neck: Supple without lymphadenopathy. Lungs: Clear to auscultation without wheezing, rhonchi or rales. CV: Normal S1, S2 without murmurs. Skin: Warm and dry, without lesions or rashes.  The following portions of the patient's history were reviewed and updated as appropriate:  allergies, current medications, past family history, past medical history, past social history, past surgical history and problem list.  Allergies as of 06/06/2018      Reactions   Morphine And Related Itching   Mobic [meloxicam] Other (See Comments)   bloating      Medication List         Accurate as of 06/06/18 12:35 PM. Always use your most recent med list.          albuterol 108 (90 Base) MCG/ACT inhaler Commonly known as:  PROVENTIL HFA;VENTOLIN HFA Inhale 2 puffs into the lungs every 6 (six) hours as needed for wheezing or shortness of breath.   Azelastine HCl 0.15 % Soln Place 2 sprays into both nostrils 2 (two) times daily.   Carbinoxamine Maleate 4 MG Tabs TAKE 1 & 1/2 TABLET BY MOUTH EVERY 6 HOURS AS NEEDED   clobetasol ointment 0.05 % Commonly known as:  TEMOVATE Apply topically daily as needed.   Fluticasone Propionate 93 MCG/ACT Exhu Place 2 sprays into the nose 2 (two) times daily.   ibuprofen 200 MG tablet Commonly known as:  ADVIL,MOTRIN Take 400-600 mg by mouth as needed.   levonorgestrel 20 MCG/24HR IUD Commonly known as:  MIRENA 1 each by Intrauterine route once.   Omega-3 1000 MG Caps Take by mouth.   pentosan polysulfate 100 MG capsule Commonly known as:  ELMIRON Take 100 mg by mouth 2 (two) times daily. rx per gyn   rizatriptan 10 MG disintegrating tablet Commonly known as:  MAXALT-MLT Take 1 tablet (10 mg total) by mouth as needed for migraine. May repeat in 2 hours if needed. Max 2x a day.   Vitamin E 100 units Tabs Take by mouth.       Allergies  Allergen Reactions  . Morphine And Related Itching  . Mobic [Meloxicam] Other (See Comments)    bloating   Review of systems: Review of systems negative except as noted in HPI / PMHx or noted below: Constitutional: Negative.  HENT: Negative.   Eyes: Negative.  Respiratory: Negative.   Cardiovascular: Negative.  Gastrointestinal: Negative.  Genitourinary: Negative.  Musculoskeletal: Negative.  Neurological: Negative.  Endo/Heme/Allergies: Negative.  Cutaneous: Negative.  Past Medical History:  Diagnosis Date  . Chronic interstitial cystitis   . Complication of anesthesia    hard to wake after anesthesia  . Endometriosis   . Headache   . Hyperlipidemia   . IUD  (intrauterine device) in place    Mirena, Dr. Pennie RushingHaygood  . Other psoriasis     Family History  Problem Relation Age of Onset  . Hyperlipidemia Mother   . Migraines Mother   . Allergic rhinitis Mother   . Heart disease Other        paternal family  . Diabetes Paternal Uncle   . Hyperlipidemia Sister   . Colon cancer Neg Hx   . Breast cancer Neg Hx     Social History   Socioeconomic History  . Marital status: Married    Spouse name: Not on file  . Number of children: 2  . Years of education: 1216  . Highest education level: Not on file  Occupational History  . Occupation: Medical interpreter    Comment: Spanish  Social Needs  . Financial resource strain: Not on file  . Food insecurity:    Worry: Not on file    Inability: Not on file  . Transportation needs:    Medical: Not on file    Non-medical: Not on file  Tobacco Use  . Smoking status: Never Smoker  . Smokeless tobacco: Never Used  Substance and Sexual Activity  . Alcohol use: Yes    Comment: rarely   . Drug use: No  . Sexual activity: Yes    Partners: Male    Birth control/protection: IUD, Surgical    Comment: BTL   Lifestyle  . Physical activity:    Days per week: Not on file    Minutes per session: Not on file  . Stress: Not on file  Relationships  . Social connections:    Talks on phone: Not on file    Gets together: Not on file    Attends religious service: Not on file    Active member of club or organization: Not on file    Attends meetings of clubs or organizations: Not on file    Relationship status: Not on file  . Intimate partner violence:    Fear of current or ex partner: Not on file    Emotionally abused: Not on file    Physically abused: Not on file    Forced sexual activity: Not on file  Other Topics Concern  . Not on file  Social History Narrative   Married    Employed as a Research officer, trade union,  mainly medical   From [place] ; 2 daughters 2008, 2010        Right handed    Caffeine: 1 or 2 cups a week    I appreciate the opportunity to take part in [place] care. Please do not hesitate to contact me with questions.  Sincerely,   R. Jorene Guest, MD

## 2018-06-06 NOTE — Assessment & Plan Note (Signed)
   Treatment plan as outlined above for allergic rhinitis. 

## 2018-06-08 ENCOUNTER — Other Ambulatory Visit: Payer: Self-pay | Admitting: *Deleted

## 2018-06-08 MED ORDER — CARBINOXAMINE MALEATE 4 MG PO TABS: ORAL_TABLET | ORAL | 1 refills | Status: DC

## 2018-06-08 NOTE — Telephone Encounter (Signed)
[facility] called needing a 90 day for carbinoxamine 4 mg tab Clinical research associatewriter sent script electronically

## 2018-06-12 ENCOUNTER — Telehealth: Payer: Self-pay

## 2018-06-12 NOTE — Telephone Encounter (Signed)
error 

## 2018-06-20 DIAGNOSIS — R102 Pelvic and perineal pain: Secondary | ICD-10-CM | POA: Diagnosis not present

## 2018-06-20 DIAGNOSIS — N3010 Interstitial cystitis (chronic) without hematuria: Secondary | ICD-10-CM | POA: Diagnosis not present

## 2018-06-20 DIAGNOSIS — R82998 Other abnormal findings in urine: Secondary | ICD-10-CM | POA: Diagnosis not present

## 2018-06-26 ENCOUNTER — Ambulatory Visit: Payer: BLUE CROSS/BLUE SHIELD | Admitting: Family Medicine

## 2018-06-26 ENCOUNTER — Encounter: Payer: Self-pay | Admitting: Family Medicine

## 2018-06-26 VITALS — BP 102/68 | HR 69 | Temp 98.2°F | Ht 62.0 in | Wt 138.6 lb

## 2018-06-26 DIAGNOSIS — Z0000 Encounter for general adult medical examination without abnormal findings: Secondary | ICD-10-CM

## 2018-06-26 DIAGNOSIS — Z23 Encounter for immunization: Secondary | ICD-10-CM | POA: Diagnosis not present

## 2018-06-26 LAB — CBC WITH DIFFERENTIAL/PLATELET
BASOS ABS: 0 10*3/uL (ref 0.0–0.1)
Basophils Relative: 0.2 % (ref 0.0–3.0)
EOS ABS: 0.1 10*3/uL (ref 0.0–0.7)
Eosinophils Relative: 1.4 % (ref 0.0–5.0)
HEMATOCRIT: 37.4 % (ref 36.0–46.0)
HEMOGLOBIN: 12.7 g/dL (ref 12.0–15.0)
LYMPHS PCT: 29.1 % (ref 12.0–46.0)
Lymphs Abs: 2.3 10*3/uL (ref 0.7–4.0)
MCHC: 34 g/dL (ref 30.0–36.0)
MCV: 86.8 fl (ref 78.0–100.0)
Monocytes Absolute: 0.5 10*3/uL (ref 0.1–1.0)
Monocytes Relative: 6.2 % (ref 3.0–12.0)
Neutro Abs: 4.9 10*3/uL (ref 1.4–7.7)
Neutrophils Relative %: 63.1 % (ref 43.0–77.0)
Platelets: 247 10*3/uL (ref 150.0–400.0)
RBC: 4.31 Mil/uL (ref 3.87–5.11)
RDW: 14 % (ref 11.5–15.5)
WBC: 7.8 10*3/uL (ref 4.0–10.5)

## 2018-06-26 LAB — LIPID PANEL
Cholesterol: 205 mg/dL — ABNORMAL HIGH (ref 0–200)
HDL: 45.6 mg/dL (ref >39.00)
LDL Cholesterol: 146 mg/dL — ABNORMAL HIGH (ref 0–99)
NONHDL: 159.82
TRIGLYCERIDES: 67 mg/dL (ref 0.0–149.0)
Total CHOL/HDL Ratio: 5
VLDL: 13.4 mg/dL (ref 0.0–40.0)

## 2018-06-26 LAB — COMPREHENSIVE METABOLIC PANEL
ALK PHOS: 47 U/L (ref 39–117)
ALT: 17 U/L (ref 0–35)
AST: 15 U/L (ref 0–37)
Albumin: 4.5 g/dL (ref 3.5–5.2)
BILIRUBIN TOTAL: 0.8 mg/dL (ref 0.2–1.2)
BUN: 9 mg/dL (ref 6–23)
CHLORIDE: 104 meq/L (ref 96–112)
CO2: 27 meq/L (ref 19–32)
CREATININE: 0.57 mg/dL (ref 0.40–1.20)
Calcium: 9.5 mg/dL (ref 8.4–10.5)
GFR: 122.57 mL/min (ref >60.00)
GLUCOSE: 97 mg/dL (ref 70–99)
POTASSIUM: 4.4 meq/L (ref 3.5–5.1)
Sodium: 138 mEq/L (ref 135–145)
Total Protein: 7 g/dL (ref 6.0–8.3)

## 2018-06-26 LAB — VITAMIN D 25 HYDROXY (VIT D DEFICIENCY, FRACTURES): VITD: 31 ng/mL (ref 30.00–100.00)

## 2018-06-26 LAB — TSH: TSH: 1.65 u[IU]/mL (ref 0.35–4.50)

## 2018-06-26 NOTE — Progress Notes (Signed)
Patient: Sierra Hodge MRN: [id number] DOB: [birth year] PCP: Colon Branch, MD     Subjective:  Chief Complaint  Patient presents with  . Transitions Of Care    DR. Paz    HPI: The patient is a 42 y.o. female who presents today for annual exam. She denies any changes to past medical history. There have been no recent hospitalizations. They are following a well balanced diet and exercise plan. She walks her dog daily, but no other cardio exercise. Weight has been stable. She would like to lose some weight, but does not like exercising.   Immunization History  Administered Date(s) Administered  . Influenza,inj,Quad PF,6+ Mos 06/22/2016, 06/24/2017, 06/26/2018  . MMR 04/29/1975, 02/08/2001  . OPV 03/03/1988  . Td 12/06/1979, 08/28/2001  . Tdap 02/27/2014   Colonoscopy: age 42. Routine screening. No risk factors.  Mammogram: 02/2018. Wnl.  Pap smear: this year.  Tdap: utd Flu shot: today    Review of Systems  Constitutional: Positive for fatigue. Negative for chills and fever.  HENT: Negative for dental problem, ear pain, hearing loss and trouble swallowing.   Eyes: Negative for visual disturbance.  Respiratory: Negative for cough, chest tightness and shortness of breath.   Cardiovascular: Negative for chest pain, palpitations and leg swelling.  Gastrointestinal: Negative for abdominal pain, blood in stool, diarrhea and nausea.  Endocrine: Negative for cold intolerance, polydipsia, polyphagia and polyuria.  Genitourinary: Negative for dysuria and hematuria.  Musculoskeletal: Negative for arthralgias, back pain and neck pain.  Skin: Negative for rash.  Neurological: Negative for dizziness and headaches.  Psychiatric/Behavioral: Negative for dysphoric mood and sleep disturbance. The patient is not nervous/anxious.     Allergies Patient is allergic to morphine and related and mobic [meloxicam].  Past Medical History Patient  has a past medical history of Chronic  interstitial cystitis, Complication of anesthesia, Endometriosis, Headache, Hyperlipidemia, IUD (intrauterine device) in place, and Other psoriasis.  Surgical History Patient  has a past surgical history that includes Cesarean section; Tubal ligation (2010); Tonsillectomy; laparoscopy (03/29/2012); cysto with hydrodistension (03/29/2012); Umbilical hernia repair (10/2010); Cholecystectomy (N/A, 07/07/2017); and Esophagogastroduodenoscopy (2018).  Family History Pateint's family history includes Allergic rhinitis in her mother; Diabetes in her paternal uncle; Heart disease in her other; Hyperlipidemia in her mother and sister; Migraines in her mother.  Social History Patient  reports that she has never smoked. She has never used smokeless tobacco. She reports that she drinks alcohol. She reports that she does not use drugs.    Objective: Vitals:   06/26/18 0928  BP: 102/68  Pulse: 69  Temp: 98.2 F (36.8 C)  TempSrc: Oral  SpO2: 98%  Weight: 138 lb 9.6 oz (62.9 kg)  Height: _0  (1.575 m)    Body mass index is 25.35 kg/m.  Physical Exam  Constitutional: She is oriented to person, place, and time. She appears well-developed and well-nourished.  HENT:  Right Ear: External ear normal.  Left Ear: External ear normal.  Mouth/Throat: Oropharynx is clear and moist.  Eyes: Pupils are equal, round, and reactive to light. Conjunctivae and EOM are normal.  Neck: Normal range of motion. Neck supple. No thyromegaly present.  Cardiovascular: Normal rate, regular rhythm, normal heart sounds and intact distal pulses.  No murmur heard. Pulmonary/Chest: Effort normal and breath sounds normal.  Abdominal: Soft. Bowel sounds are normal. She exhibits no distension. There is no tenderness.  Lymphadenopathy:    She has no cervical adenopathy.  Neurological: She is alert and oriented to person, place,  and time. She displays normal reflexes. No cranial nerve deficit. Coordination normal.  Skin: Skin  is warm and dry. No rash noted.  Psychiatric: She has a normal mood and affect. Her behavior is normal.  Vitals reviewed.      Assessment/plan: 1. Annual physical exam Routine lab work today. Flu shot today. utd on health maintenance and requesting her pap/mmg results. F/u in one year or as needed.  Patient counseling _0    Nutrition: Stressed importance of moderation in sodium/caffeine intake, saturated fat and cholesterol, caloric balance, sufficient intake of fresh fruits, vegetables, fiber, calcium, iron, and 1 mg of folate supplement per day (for females capable of pregnancy).  _1    Stressed the importance of regular exercise.   _2    Substance Abuse: Discussed cessation/primary prevention of tobacco, alcohol, or other drug use; driving or other dangerous activities under the influence; availability of treatment for abuse.   _3    Injury prevention: Discussed safety belts, safety helmets, smoke detector, smoking near bedding or upholstery.   _4    Sexuality: Discussed sexually transmitted diseases, partner selection, use of condoms, avoidance of unintended pregnancy  and contraceptive alternatives.  _5    Dental health: Discussed importance of regular tooth brushing, flossing, and dental visits.  _6    Health maintenance and immunizations reviewed. Please refer to Health maintenance section.    - CBC with Differential/Platelet - Comprehensive metabolic panel - Lipid panel - TSH - VITAMIN D 25 Hydroxy (Vit-D Deficiency, Fractures)  Return in about 1 year (around 06/27/2019).   Orma Flaming, MD [place]  06/26/2018

## 2018-06-30 ENCOUNTER — Encounter: Payer: BLUE CROSS/BLUE SHIELD | Admitting: Internal Medicine

## 2018-07-10 ENCOUNTER — Encounter: Payer: Self-pay | Admitting: Family Medicine

## 2018-07-10 DIAGNOSIS — Z975 Presence of (intrauterine) contraceptive device: Secondary | ICD-10-CM | POA: Insufficient documentation

## 2018-08-02 ENCOUNTER — Telehealth: Payer: Self-pay | Admitting: *Deleted

## 2018-08-02 NOTE — Telephone Encounter (Signed)
Copied from CRM [id number]. Topic: General - Other >> Aug 02, 2018  9:42 AM Gean BirchwoodWilliams-Neal, Sade R wrote: Patient is calling in requesting her results from her physical to be mailed out to her

## 2018-08-03 NOTE — Telephone Encounter (Signed)
Mailed lab results from 10/7 office visit to patient per her request.

## 2018-09-08 ENCOUNTER — Encounter: Payer: Self-pay | Admitting: Family Medicine

## 2018-09-08 ENCOUNTER — Ambulatory Visit: Payer: BLUE CROSS/BLUE SHIELD | Admitting: Family Medicine

## 2018-09-08 VITALS — BP 116/72 | HR 74 | Temp 98.1°F | Ht 62.0 in | Wt 140.2 lb

## 2018-09-08 DIAGNOSIS — G500 Trigeminal neuralgia: Secondary | ICD-10-CM | POA: Diagnosis not present

## 2018-09-08 MED ORDER — GABAPENTIN 100 MG PO CAPS: 100.0000 mg | ORAL_CAPSULE | Freq: Three times a day (TID) | ORAL | 0 refills | Status: DC

## 2018-09-08 MED ORDER — KETOROLAC TROMETHAMINE 60 MG/2ML IM SOLN
60.0000 mg | Freq: Once | INTRAMUSCULAR | Status: AC
  Administered 2018-09-08: 60 mg via INTRAMUSCULAR

## 2018-09-08 MED ORDER — GABAPENTIN 300 MG PO CAPS: 300.0000 mg | ORAL_CAPSULE | Freq: Three times a day (TID) | ORAL | 0 refills | Status: DC

## 2018-09-08 NOTE — Patient Instructions (Signed)
I think you just have irritation of the 3rd branch of the trigeminal nerve... hopefully this will just go away. We are going to treat the inflammation with a toradol shot today and then ibuprofen twice to three times a day at home. Also sending in nerve medication called gabapentin that helps with nerve pain.   If not better in 1-2 weeks we need to make sure nothing else going on like trigeminal neuralgia so please let me know....   Do not take any ibuprofen until 6 hours after shot.

## 2018-09-08 NOTE — Progress Notes (Signed)
Patient: Sierra ChyleMarlen M Spurgin MRN: [id number] DOB: 10/26/1975 PCP: Orland MustardWolfe, Miral Hoopes, MD     Subjective:  Chief Complaint  Patient presents with  . Headache  . right ear pain    HPI: The patient is a 42 y.o. female who presents today for headache and right ear pain. She went to the dentis last Thursday and she had 2 anesthetic injections on the right lower jaw. She felt a shock and he told her he likely touched her nerve. She had a filling and they put her bridge back. Three days ago she started to have pain in the right lower jaw, her teeth both  upper and lower and up into her right ear. She also got a headache at that time. She has migraines, but this pain is different. She has taken ibuprofen and this has helped both the pain and the headache. She does have chills, but no fever. Pain rated as a 5/10. She has not taken any medication.   Review of Systems  Constitutional: Positive for chills. Negative for fatigue and fever.  HENT: Positive for dental problem, ear pain, sinus pressure and sinus pain. Negative for congestion, postnasal drip and rhinorrhea.        C/o teeth and jaw pain on right side of mouth  C/o right ear pain  Respiratory: Negative for cough and shortness of breath.   Cardiovascular: Negative for chest pain.  Gastrointestinal: Negative for abdominal pain and nausea.  Musculoskeletal: Negative for back pain and neck pain.  Neurological: Positive for headaches. Negative for dizziness.    Allergies Patient is allergic to morphine and related and mobic [meloxicam].  Past Medical History Patient  has a past medical history of Chronic interstitial cystitis, Complication of anesthesia, Endometriosis, Headache, Hyperlipidemia, IUD (intrauterine device) in place, and Other psoriasis.  Surgical History Patient  has a past surgical history that includes Cesarean section; Tubal ligation (2010); Tonsillectomy; laparoscopy (03/29/2012); cysto with hydrodistension (03/29/2012);  Umbilical hernia repair (10/2010); Cholecystectomy (N/A, 07/07/2017); and Esophagogastroduodenoscopy (2018).  Family History Pateint's family history includes Allergic rhinitis in her mother; Alzheimer's disease in her father; Diabetes in her paternal uncle; Heart disease in an other family member; Hyperlipidemia in her mother and sister; Migraines in her mother.  Social History Patient  reports that she has never smoked. She has never used smokeless tobacco. She reports current alcohol use. She reports that she does not use drugs.    Objective: Vitals:   09/08/18 1347  BP: 116/72  Pulse: 74  Temp: 98.1 F (36.7 C)  TempSrc: Oral  SpO2: 96%  Weight: 140 lb 3.2 oz (63.6 kg)  Height: 5\' 2"  (1.575 m)    Body mass index is 25.64 kg/m.  Physical Exam Vitals signs reviewed.  Constitutional:      Appearance: She is well-developed.  HENT:     Head:     Comments: Tm pearly with light reflex bilaterally  No abscess where dental work done.     Mouth/Throat:     Mouth: Mucous membranes are moist.  Eyes:     Extraocular Movements: Extraocular movements intact.     Pupils: Pupils are equal, round, and reactive to light.  Neck:     Musculoskeletal: Normal range of motion and neck supple.  Cardiovascular:     Rate and Rhythm: Normal rate and regular rhythm.     Heart sounds: No murmur.  Pulmonary:     Effort: Pulmonary effort is normal.     Breath sounds: Normal breath sounds.  Abdominal:  General: Bowel sounds are normal.     Palpations: Abdomen is soft.  Lymphadenopathy:     Cervical: No cervical adenopathy.  Skin:    General: Skin is warm.     Findings: No rash.  Neurological:     Mental Status: She is alert.     Cranial Nerves: No cranial nerve deficit or facial asymmetry.     Comments: She has pain over distribution of third branch of trigeminal nerve on right         Assessment/plan: 1. Trigeminal ganglionitis I think her 3rd branch of the trigeminal nerve is  irritated/inflammed from dental work since they numb that area. Discussed we are going to do conservative therapy with ibuprofen prn and gabapentin prn. She is requesting lowest dose of this. Discussed could make her drowsy. If not feeling better in 1-2 weeks, I want her to let her dentist and me know. toradol shot today. She tolerated NSAIDs fine even though allergy to mobic in chart. She thinks this was her gallbladder, not the medication. She tolerated injection fine.  - ketorolac (TORADOL) injection 60 mg   Return if symptoms worsen or fail to improve.    Orland MustardAllison Goldye Tourangeau, MD [facility]   09/08/2018

## 2018-10-08 LAB — HM PAP SMEAR: HM Pap smear: NEGATIVE

## 2018-10-08 LAB — RESULTS CONSOLE HPV: CHL HPV: NEGATIVE

## 2018-10-18 DIAGNOSIS — N803 Endometriosis of pelvic peritoneum: Secondary | ICD-10-CM | POA: Diagnosis not present

## 2018-10-18 DIAGNOSIS — Z01419 Encounter for gynecological examination (general) (routine) without abnormal findings: Secondary | ICD-10-CM | POA: Diagnosis not present

## 2018-10-18 DIAGNOSIS — R6882 Decreased libido: Secondary | ICD-10-CM | POA: Diagnosis not present

## 2018-10-18 DIAGNOSIS — Z124 Encounter for screening for malignant neoplasm of cervix: Secondary | ICD-10-CM | POA: Diagnosis not present

## 2018-10-18 DIAGNOSIS — N3010 Interstitial cystitis (chronic) without hematuria: Secondary | ICD-10-CM | POA: Diagnosis not present

## 2018-10-18 DIAGNOSIS — N898 Other specified noninflammatory disorders of vagina: Secondary | ICD-10-CM | POA: Diagnosis not present

## 2018-10-18 DIAGNOSIS — Z6828 Body mass index (BMI) 28.0-28.9, adult: Secondary | ICD-10-CM | POA: Diagnosis not present

## 2018-12-04 ENCOUNTER — Other Ambulatory Visit: Payer: Self-pay

## 2018-12-04 ENCOUNTER — Ambulatory Visit (INDEPENDENT_AMBULATORY_CARE_PROVIDER_SITE_OTHER): Payer: BLUE CROSS/BLUE SHIELD | Admitting: Physician Assistant

## 2018-12-04 ENCOUNTER — Encounter: Payer: Self-pay | Admitting: Physician Assistant

## 2018-12-04 VITALS — BP 120/62 | HR 102 | Temp 98.7°F | Ht 62.0 in | Wt 140.0 lb

## 2018-12-04 DIAGNOSIS — R112 Nausea with vomiting, unspecified: Secondary | ICD-10-CM | POA: Diagnosis not present

## 2018-12-04 MED ORDER — ONDANSETRON HCL 4 MG/2ML IJ SOLN
4.0000 mg | Freq: Once | INTRAMUSCULAR | Status: AC
  Administered 2018-12-04: 4 mg via INTRAMUSCULAR

## 2018-12-04 MED ORDER — ONDANSETRON HCL 4 MG PO TABS: 4.0000 mg | ORAL_TABLET | Freq: Three times a day (TID) | ORAL | 0 refills | Status: DC | PRN

## 2018-12-04 NOTE — Progress Notes (Signed)
Sierra Hodge is a 43 y.o. female here for a new problem.  History of Present Illness:   Chief Complaint  Patient presents with  . Diarrhea    HPI   Questions for Screening COVID-19  Symptom onset: early this morning  Travel or Contacts: no travel/no contacts  During this illness, did/does the patient experience any of the following symptoms? Fever >100.30F []   Yes [x]   No []   Unknown Subjective fever (felt feverish) []   Yes [x]   No []   Unknown Chills [x]   Yes []   No []   Unknown Muscle aches (myalgia) []   Yes [x]   No []   Unknown Runny nose (rhinorrhea) []   Yes [x]   No []   Unknown Sore throat []   Yes [x]   No []   Unknown Cough (new onset or worsening of chronic cough) []   Yes [x]   No []   Unknown Shortness of breath (dyspnea) []   Yes [x]   No []   Unknown Nausea or vomiting [x]   Yes []   No []   Unknown Headache [x]   Yes []   No []   Unknown Abdominal pain  [x]   Yes []   No []   Unknown Diarrhea (?3 loose/looser than normal stools/24hr period) [x]   Yes []   No []   Unknown Other, specify:_____________________________________________   Patient risk factors: Smoker? []   Current []   Former [x]   Never If female, currently pregnant? []   Yes [x]   No   Husband traveled to [place], Mon-Wed of last week. She and her daughter have GI symptoms. Started today. Diarrhea non-stop today, vomiting, no fever/runny nose/chills. She states that she went to a baby shower over the weekend and one of the babies had a GI bug.    Past Medical History:  Diagnosis Date  . Chronic interstitial cystitis   . Complication of anesthesia    hard to wake after anesthesia  . Endometriosis   . Headache   . Hyperlipidemia   . IUD (intrauterine device) in place    Mirena, Dr. Pennie Rushing  . Other psoriasis      Social History   Socioeconomic History  . Marital status: Married    Spouse name: Not on file  . Number of children: 2  . Years of education: 41  . Highest education level: Not on file  Occupational  History  . Occupation: Medical interpreter    Comment: Spanish  Social Needs  . Financial resource strain: Not on file  . Food insecurity:    Worry: Not on file    Inability: Not on file  . Transportation needs:    Medical: Not on file    Non-medical: Not on file  Tobacco Use  . Smoking status: Never Smoker  . Smokeless tobacco: Never Used  Substance and Sexual Activity  . Alcohol use: Yes    Comment: rarely   . Drug use: No  . Sexual activity: Yes    Partners: Male    Birth control/protection: I.U.D., Surgical    Comment: BTL   Lifestyle  . Physical activity:    Days per week: Not on file    Minutes per session: Not on file  . Stress: Not on file  Relationships  . Social connections:    Talks on phone: Not on file    Gets together: Not on file    Attends religious service: Not on file    Active member of club or organization: Not on file    Attends meetings of clubs or organizations: Not on file    Relationship status:  Not on file  . Intimate partner violence:    Fear of current or ex partner: Not on file    Emotionally abused: Not on file    Physically abused: Not on file    Forced sexual activity: Not on file  Other Topics Concern  . Not on file  Social History Narrative   Married    Employed as a Research officer, trade union,  mainly medical   From [place] ; 2 daughters 2008, 2010        Right handed   Caffeine: 1 or 2 cups a week    Past Surgical History:  Procedure Laterality Date  . CESAREAN SECTION     2008, 2010  . CHOLECYSTECTOMY N/A 07/07/2017   Procedure: LAPAROSCOPIC CHOLECYSTECTOMY;  Surgeon: Abigail Miyamoto, MD;  Location: [facility];  Service: General;  Laterality: N/A;  . CYSTO WITH HYDRODISTENSION  03/29/2012   Procedure: CYSTOSCOPY/HYDRODISTENSION;  Surgeon: Martina Sinner, MD;  Location: [facility];  Service: Urology;  Laterality: N/A;  with Instillation of Marcaine and Pyridium   . ESOPHAGOGASTRODUODENOSCOPY  2018  .  LAPAROSCOPY  03/29/2012   Procedure: LAPAROSCOPY OPERATIVE;  Surgeon: Hal Morales, MD;  Location: [facility];  Service: Gynecology;  Laterality: N/A;  with Fulguration of Endometriosis and Peritoneal Biopsy  . TONSILLECTOMY    . TUBAL LIGATION  2010  . UMBILICAL HERNIA REPAIR  10/2010    Family History  Problem Relation Age of Onset  . Alzheimer's disease Father   . Hyperlipidemia Mother   . Migraines Mother   . Allergic rhinitis Mother   . Heart disease Other        paternal family  . Diabetes Paternal Uncle   . Hyperlipidemia Sister   . Colon cancer Neg Hx   . Breast cancer Neg Hx     Allergies  Allergen Reactions  . Morphine And Related Itching  . Mobic [Meloxicam] Other (See Comments)    bloating    Current Medications:   Current Outpatient Medications:  .  albuterol (PROVENTIL HFA;VENTOLIN HFA) 108 (90 Base) MCG/ACT inhaler, Inhale 2 puffs into the lungs every 6 (six) hours as needed for wheezing or shortness of breath., Disp: 1 Inhaler, Rfl: 2 .  Carbinoxamine Maleate 4 MG TABS, TAKE 1 & 1/2 TABLET BY MOUTH EVERY 6 HOURS AS NEEDED, Disp: 540 each, Rfl: 1 .  clobetasol ointment (TEMOVATE) 0.05 %, Apply topically daily as needed., Disp: 60 g, Rfl: 5 .  diclofenac sodium (VOLTAREN) 1 % GEL, Apply topically as needed., Disp: , Rfl: 2 .  Fluticasone Propionate (XHANCE) 93 MCG/ACT EXHU, Place 2 sprays into the nose 2 (two) times daily., Disp: 32 mL, Rfl: 5 .  gabapentin (NEURONTIN) 100 MG capsule, Take 1 capsule (100 mg total) by mouth 3 (three) times daily., Disp: 90 capsule, Rfl: 0 .  ibuprofen (ADVIL,MOTRIN) 200 MG tablet, Take 400-600 mg by mouth as needed., Disp: , Rfl:  .  levonorgestrel (MIRENA) 20 MCG/24HR IUD, 1 each by Intrauterine route once., Disp: , Rfl:  .  Omega-3 1000 MG CAPS, Take by mouth., Disp: , Rfl:  .  pentosan polysulfate (ELMIRON) 100 MG capsule, Take 100 mg by mouth 2 (two) times daily. rx per gyn, Disp: , Rfl:  .  rizatriptan (MAXALT-MLT) 10 MG  disintegrating tablet, Take 1 tablet (10 mg total) by mouth as needed for migraine. May repeat in 2 hours if needed. Max 2x a day., Disp: 9 tablet, Rfl: 11 .  Vitamin E 100 units  TABS, Take by mouth., Disp: , Rfl:  .  ondansetron (ZOFRAN) 4 MG tablet, Take 1 tablet (4 mg total) by mouth every 8 (eight) hours as needed for nausea or vomiting., Disp: 20 tablet, Rfl: 0  Current Facility-Administered Medications:  .  ondansetron (ZOFRAN) injection 4 mg, 4 mg, Intramuscular, Once, [place], [place], [place]  Facility-Administered Medications Ordered in Other Visits:  .  gadopentetate dimeglumine (MAGNEVIST) injection 13 mL, 13 mL, Intravenous, Once PRN, Anson Fret, MD   Review of Systems:   Review of Systems  Constitutional: Negative for chills, fever, malaise/fatigue and weight loss.  Respiratory: Negative for shortness of breath.   Cardiovascular: Negative for chest pain, orthopnea, claudication and leg swelling.  Gastrointestinal: Positive for diarrhea, nausea and vomiting. Negative for abdominal pain and heartburn.  Neurological: Negative for dizziness, tingling and headaches.      Vitals:   Vitals:   12/04/18 1559  BP: 120/62  Pulse: (!) 102  Temp: 98.7 F (37.1 C)  TempSrc: Oral  Weight: 140 lb (63.5 kg)  Height:  (1.575 m)     Body mass index is 25.61 kg/m.  Physical Exam:   Physical Exam Vitals signs and nursing note reviewed.  Constitutional:      General: She is not in acute distress.    Appearance: She is well-developed. She is not ill-appearing or toxic-appearing.  Cardiovascular:     Rate and Rhythm: Normal rate and regular rhythm.     Pulses: Normal pulses.     Heart sounds: Normal heart sounds, S1 normal and S2 normal.     Comments: No LE edema Pulmonary:     Effort: Pulmonary effort is normal.     Breath sounds: Normal breath sounds.  Abdominal:     General: Abdomen is flat. Bowel sounds are absent.     Palpations: Abdomen is soft.     Tenderness:  There is no abdominal tenderness.  Skin:    General: Skin is warm and dry.  Neurological:     Mental Status: She is alert.     GCS: GCS eye subscore is 4. GCS verbal subscore is 5. GCS motor subscore is 6.  Psychiatric:        Speech: Speech normal.        Behavior: Behavior normal. Behavior is cooperative.       Assessment and Plan:   Gianny was seen today for diarrhea.  Diagnoses and all orders for this visit:  Nausea and vomiting, intractability of vomiting not specified, unspecified vomiting type -     ondansetron (ZOFRAN) injection 4 mg  Other orders -     ondansetron (ZOFRAN) 4 MG tablet; Take 1 tablet (4 mg total) by mouth every 8 (eight) hours as needed for nausea or vomiting.   Suspect viral GI illness. Zofran injection in the office given, she tolerated well. Follow-up if symptoms persist. Oral zofran rx given.  . Reviewed expectations re: course of current medical issues. . Discussed self-management of symptoms. . Outlined signs and symptoms indicating need for more acute intervention. . Patient verbalized understanding and all questions were answered. . See orders for this visit as documented in the electronic medical record. . Patient received an After-Visit Summary.   Jarold Motto, PA-C

## 2018-12-04 NOTE — Patient Instructions (Signed)
Please increase your fluid intake. Hydration is key!  Get plenty of rest.   If you feel up to eating solid foods, try to follow the dietary guide at the bottom of your After Visit Summary. These foods are gentler on the stomach.  If you are unable to keep fluids down by mouth, or if symptoms acutely worsen, please go to the ER as you may need IV fluids.    Food Choices to Help Relieve Diarrhea, Adult When you have diarrhea, the foods you eat and your eating habits are very important. Choosing the right foods and drinks can help relieve diarrhea. Also, because diarrhea can last up to 7 days, you need to replace lost fluids and electrolytes (such as sodium, potassium, and chloride) in order to help prevent dehydration.  WHAT GENERAL GUIDELINES DO I NEED TO FOLLOW?  Slowly drink 1 cup (8 oz) of fluid for each episode of diarrhea. If you are getting enough fluid, your urine will be clear or pale yellow.  Eat starchy foods. Some good choices include white rice, white toast, pasta, low-fiber cereal, baked potatoes (without the skin), saltine crackers, and bagels.  Avoid large servings of any cooked vegetables.  Limit fruit to two servings per day. A serving is  cup or 1 small piece.  Choose foods with less than 2 g of fiber per serving.  Limit fats to less than 8 tsp (38 g) per day.  Avoid fried foods.  Eat foods that have probiotics in them. Probiotics can be found in certain dairy products.  Avoid foods and beverages that may increase the speed at which food moves through the stomach and intestines (gastrointestinal tract). Things to avoid include:  High-fiber foods, such as dried fruit, raw fruits and vegetables, nuts, seeds, and whole grain foods.  Spicy foods and high-fat foods.  Foods and beverages sweetened with high-fructose corn syrup, honey, or sugar alcohols such as xylitol, sorbitol, and mannitol. WHAT FOODS ARE RECOMMENDED? Grains White rice. White, French, or pita  breads (fresh or toasted), including plain rolls, buns, or bagels. White pasta. Saltine, soda, or graham crackers. Pretzels. Low-fiber cereal. Cooked cereals made with water (such as cornmeal, farina, or cream cereals). Plain muffins. Matzo. Melba toast. Zwieback.  Vegetables Potatoes (without the skin). Strained tomato and vegetable juices. Most well-cooked and canned vegetables without seeds. Tender lettuce. Fruits Cooked or canned applesauce, apricots, cherries, fruit cocktail, grapefruit, peaches, pears, or plums. Fresh bananas, apples without skin, cherries, grapes, cantaloupe, grapefruit, peaches, oranges, or plums.  Meat and Other Protein Products Baked or boiled chicken. Eggs. Tofu. Fish. Seafood. Smooth peanut butter. Ground or well-cooked tender beef, ham, veal, lamb, pork, or poultry.  Dairy Plain yogurt, kefir, and unsweetened liquid yogurt. Lactose-free milk, buttermilk, or soy milk. Plain hard cheese. Beverages Sport drinks. Clear broths. Diluted fruit juices (except prune). Regular, caffeine-free sodas such as ginger ale. Water. Decaffeinated teas. Oral rehydration solutions. Sugar-free beverages not sweetened with sugar alcohols. Other Bouillon, broth, or soups made from recommended foods.  The items listed above may not be a complete list of recommended foods or beverages. Contact your dietitian for more options. WHAT FOODS ARE NOT RECOMMENDED? Grains Whole grain, whole wheat, bran, or rye breads, rolls, pastas, crackers, and cereals. Wild or brown rice. Cereals that contain more than 2 g of fiber per serving. Corn tortillas or taco shells. Cooked or dry oatmeal. Granola. Popcorn. Vegetables Raw vegetables. Cabbage, broccoli, Brussels sprouts, artichokes, baked beans, beet greens, corn, kale, legumes, peas, sweet potatoes, and   yams. Potato skins. Cooked spinach and cabbage. Fruits Dried fruit, including raisins and dates. Raw fruits. Stewed or dried prunes. Fresh apples with  skin, apricots, mangoes, pears, raspberries, and strawberries.  Meat and Other Protein Products Chunky peanut butter. Nuts and seeds. Beans and lentils. Bacon.  Dairy High-fat cheeses. Milk, chocolate milk, and beverages made with milk, such as milk shakes. Cream. Ice cream. Sweets and Desserts Sweet rolls, doughnuts, and sweet breads. Pancakes and waffles. Fats and Oils Butter. Cream sauces. Margarine. Salad oils. Plain salad dressings. Olives. Avocados.  Beverages Caffeinated beverages (such as coffee, tea, soda, or energy drinks). Alcoholic beverages. Fruit juices with pulp. Prune juice. Soft drinks sweetened with high-fructose corn syrup or sugar alcohols. Other Coconut. Hot sauce. Chili powder. Mayonnaise. Gravy. Cream-based or milk-based soups.  The items listed above may not be a complete list of foods and beverages to avoid. Contact your dietitian for more information. WHAT SHOULD I DO IF I BECOME DEHYDRATED? Diarrhea can sometimes lead to dehydration. Signs of dehydration include dark urine and dry mouth and skin. If you think you are dehydrated, you should rehydrate with an oral rehydration solution. These solutions can be purchased at pharmacies, retail stores, or online.  Drink -1 cup (120-240 mL) of oral rehydration solution each time you have an episode of diarrhea. If drinking this amount makes your diarrhea worse, try drinking smaller amounts more often. For example, drink 1-3 tsp (5-15 mL) every 5-10 minutes.  A general rule for staying hydrated is to drink 1-2 L of fluid per day. Talk to your health care provider about the specific amount you should be drinking each day. Drink enough fluids to keep your urine clear or pale yellow.   This information is not intended to replace advice given to you by your health care provider. Make sure you discuss any questions you have with your health care provider.   Document Released: 11/27/2003 Document Revised: 09/27/2014 Document  Reviewed: 07/30/2013 Elsevier Interactive Patient Education 2016 Elsevier Inc.    

## 2019-02-07 ENCOUNTER — Other Ambulatory Visit: Payer: Self-pay

## 2019-02-07 ENCOUNTER — Encounter: Payer: Self-pay | Admitting: Family Medicine

## 2019-02-07 ENCOUNTER — Ambulatory Visit: Payer: BLUE CROSS/BLUE SHIELD | Admitting: Family Medicine

## 2019-02-07 VITALS — BP 102/68 | HR 65 | Temp 98.1°F | Ht 62.0 in | Wt 142.6 lb

## 2019-02-07 DIAGNOSIS — R142 Eructation: Secondary | ICD-10-CM | POA: Diagnosis not present

## 2019-02-07 DIAGNOSIS — R1011 Right upper quadrant pain: Secondary | ICD-10-CM | POA: Diagnosis not present

## 2019-02-07 NOTE — Progress Notes (Signed)
Patient: Sierra Hodge MRN: [id number] DOB: 1976-05-04 PCP: Orland MustardWolfe, Paulino Cork, MD     Subjective:  Chief Complaint  Patient presents with  . Abdominal Pain    RUQ pain    HPI: The patient is a 43 y.o. female who presents today for RUQ pain that is under her ribs. The last few days she has had a pain in her Rlq as well. Pain started in the RUQ about one year ago after she got her gallbladder out. She doesn't really have any changes in her bowel habits. No blood in stool. Pain in RUQ rated as a 2/10, but when pain is bad it is much higher. Ibuprofen really helps the pain. Peppermint tea also helps. Pain will come and go.  Sometimes goes weeks/months without the pain. Not associated with food. Pain under scar from where she had gallbladder taken out. She also has a lot of burping and is afraid of eating. When she woke up this AM she was burping without even eating. Milk makes the pain in her RUQ worse as well. She feels like the pain is like gas pain. [place] but no recent travel. No fever/chills. No n/v.   Review of Systems  Constitutional: Negative for fatigue and fever.  Respiratory: Negative for shortness of breath.   Cardiovascular: Negative for chest pain.  Gastrointestinal: Positive for abdominal pain. Negative for constipation, diarrhea, nausea and vomiting.       Pain continues in RLQ but has now radiated to RUQ-pt had cholecystectomy in approx 2018.  Musculoskeletal: Negative for back pain and neck pain.  Neurological: Negative for dizziness and headaches.  Psychiatric/Behavioral: Negative for sleep disturbance.    Allergies Patient is allergic to morphine and related and mobic [meloxicam].  Past Medical History Patient  has a past medical history of Chronic interstitial cystitis, Complication of anesthesia, Endometriosis, Headache, Hyperlipidemia, IUD (intrauterine device) in place, and Other psoriasis.  Surgical History Patient  has a past surgical  history that includes Cesarean section; Tubal ligation (2010); Tonsillectomy; laparoscopy (03/29/2012); cysto with hydrodistension (03/29/2012); Umbilical hernia repair (10/2010); Cholecystectomy (N/A, 07/07/2017); and Esophagogastroduodenoscopy (2018).  Family History Pateint's family history includes Allergic rhinitis in her mother; Alzheimer's disease in her father; Diabetes in her paternal uncle; Heart disease in an other family member; Hyperlipidemia in her mother and sister; Migraines in her mother.  Social History Patient  reports that she has never smoked. She has never used smokeless tobacco. She reports current alcohol use. She reports that she does not use drugs.    Objective: Vitals:   02/07/19 1123  BP: 102/68  Pulse: 65  Temp: 98.1 F (36.7 C)  TempSrc: Oral  SpO2: 97%  Weight: 142 lb 9.6 oz (64.7 kg)  Height: 5\' 2"  (1.575 m)    Body mass index is 26.08 kg/m.  Physical Exam Vitals signs reviewed.  Constitutional:      Appearance: She is well-developed.  HENT:     Head: Normocephalic and atraumatic.  Cardiovascular:     Rate and Rhythm: Normal rate and regular rhythm.     Heart sounds: Normal heart sounds.  Pulmonary:     Effort: Pulmonary effort is normal.  Abdominal:     General: Abdomen is flat. Bowel sounds are normal.     Palpations: Abdomen is soft.     Tenderness: There is abdominal tenderness in the right upper quadrant. There is no guarding or rebound. Negative signs include Murphy's sign, Rovsing's sign and McBurney's sign.  Hernia: No hernia is present.  Skin:    General: Skin is warm and dry.     Capillary Refill: Capillary refill takes less than 2 seconds.  Neurological:     General: No focal deficit present.     Mental Status: She is alert and oriented to person, place, and time.  Psychiatric:        Mood and Affect: Mood normal.        Behavior: Behavior normal.        Assessment/plan: 1. Belching discussed usually diet related.  Checking h.pylori with frequent travel to [place]. Starting her on otc pepcid for trial x 2-3 weeks and FODMOP diet given. No bloating, diarrhea/blood in stool.  - H. pylori breath test  2. Right upper quadrant pain Chronic pain x 1 year with new intermittent RLQ pain. Labs normal at last visit with this pain so will defer this for now. CT scan for further look. I did discuss sounds more like nerve irritation/scar tissue, but will check CT to make sure nothing else structural going on. In RLQ make sure no hernia although none appreciated on exam or ovarian issue. Precautions given.  - CT Abdomen Pelvis W Contrast; Future   Return if symptoms worsen or fail to improve.   Orland Mustard, MD [place] [facility]   02/07/2019

## 2019-02-07 NOTE — Patient Instructions (Signed)
-  would get over the counter pepcid for reflux and do trial of this for 2+ weeks. Can also get nexium over the counter.   -fodmop diet would help with belching/burping  -CT scan of abdomen to rule out any structural issues causing pain x 1 year. They will call you to schedule this.    Your imaging referral has been sent to [facility].  Their phone number is [phone].  Please wait 2 business days before calling them to schedule your appointment.

## 2019-02-08 LAB — H. PYLORI BREATH TEST: H. pylori Breath Test: NOT DETECTED

## 2019-02-21 ENCOUNTER — Telehealth: Payer: Self-pay | Admitting: *Deleted

## 2019-02-21 NOTE — Telephone Encounter (Signed)

## 2019-02-23 ENCOUNTER — Other Ambulatory Visit: Payer: Self-pay

## 2019-02-23 ENCOUNTER — Other Ambulatory Visit: Payer: BLUE CROSS/BLUE SHIELD

## 2019-02-23 ENCOUNTER — Ambulatory Visit (INDEPENDENT_AMBULATORY_CARE_PROVIDER_SITE_OTHER)
Admission: RE | Admit: 2019-02-23 | Discharge: 2019-02-23 | Disposition: A | Discharge status: Home / Self Care | Payer: BLUE CROSS/BLUE SHIELD | Source: Ambulatory Visit | Attending: Family Medicine | Admitting: Family Medicine

## 2019-02-23 DIAGNOSIS — R1011 Right upper quadrant pain: Secondary | ICD-10-CM | POA: Diagnosis not present

## 2019-02-23 DIAGNOSIS — K760 Fatty (change of) liver, not elsewhere classified: Secondary | ICD-10-CM | POA: Diagnosis not present

## 2019-02-23 MED ORDER — IOHEXOL 300 MG/ML  SOLN
100.0000 mL | Freq: Once | INTRAMUSCULAR | Status: AC | PRN
  Administered 2019-02-23: 100 mL via INTRAVENOUS

## 2019-03-05 ENCOUNTER — Telehealth: Payer: Self-pay | Admitting: Family Medicine

## 2019-03-05 NOTE — Telephone Encounter (Signed)
Attempted to contact patient and r/c message that "call did not go through" Unable to leave voicemail message, will try back later.

## 2019-03-05 NOTE — Telephone Encounter (Signed)
See note  Copied from [place] [phone]. Topic: General - Other >> Mar 05, 2019 10:06 AM [name] wrote: Reason for CRM: pt called and states that she would like to speak to the nurse regarding recent lab results. Please advise

## 2019-03-07 NOTE — Telephone Encounter (Signed)
Spoke to patient and had lengthy conversation with patient regarding fatty liver shown on CT scan.  Answered multiple questions and gave patient reassurance.  She verbalized understanding.

## 2019-03-15 ENCOUNTER — Telehealth: Payer: Self-pay | Admitting: *Deleted

## 2019-03-15 NOTE — Telephone Encounter (Signed)
Patient called wanted to know allergy test result to cat since her daughter wants to get a cat. 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 Patient verbalized understanding wanted to know what else she was allergic to advised dust mite and mold 2 were positive. Patient would like copy of allergy test sent to her.  Sierra Hodge can you send this she gave verbal consent

## 2019-03-21 ENCOUNTER — Ambulatory Visit: Payer: Self-pay

## 2019-03-21 DIAGNOSIS — L299 Pruritus, unspecified: Secondary | ICD-10-CM | POA: Diagnosis not present

## 2019-03-21 DIAGNOSIS — L239 Allergic contact dermatitis, unspecified cause: Secondary | ICD-10-CM | POA: Diagnosis not present

## 2019-03-21 NOTE — Telephone Encounter (Signed)
Patient called and she says she has a rash that started on Monday night to her left arm, her legs and her head itches. She says they look like mosquito bites, but they are not bites. She says there are a lot of small, red spots and some are yellow looking. She says she has been using benadryl cream and it helps some, but they still itch really bad. She says there is some pain at a 5-6, but itch more than pain. She denies any other symptoms. I advised someone from the office will call tomorrow to schedule a virtual visit, care advice given, she verbalized understanding.  Reason for Disposition . [1] Severe localized itching AND [2] after 2 days of steroid cream  Answer Assessment - Initial Assessment Questions 1. APPEARANCE of RASH: "Describe the rash."      Looks like a mosquito bite 2. LOCATION: "Where is the rash located?"      Left arm, both legs, head itches 3. NUMBER: "How many spots are there?"      A lot of spots 4. SIZE: "How big are the spots?" (Inches, centimeters or compare to size of a coin)      Small 5. ONSET: "When did the rash start?"      Monday night 6. ITCHING: "Does the rash itch?" If so, ask: "How bad is the itch?"  (Scale 1-10; or mild, moderate, severe)     Severe 7. PAIN: "Does the rash hurt?" If so, ask: "How bad is the pain?"  (Scale 1-10; or mild, moderate, severe)     Yes, especially on the arm, not that painful 5-6 8. OTHER SYMPTOMS: "Do you have any other symptoms?" (e.g., fever)     No 9. PREGNANCY: "Is there any chance you are pregnant?" "When was your last menstrual period?"     No  Protocols used: RASH OR REDNESS - LOCALIZED-A-AH

## 2019-03-22 NOTE — Telephone Encounter (Signed)
Called pt 2X to schedule virtual visit, no answer, LVM.

## 2019-04-26 DIAGNOSIS — Z1231 Encounter for screening mammogram for malignant neoplasm of breast: Secondary | ICD-10-CM | POA: Diagnosis not present

## 2019-06-04 ENCOUNTER — Ambulatory Visit: Payer: BLUE CROSS/BLUE SHIELD | Admitting: Allergy and Immunology

## 2019-06-08 IMAGING — CT CT MAXILLOFACIAL W/O CM
3 series · 16 of 47 positions shown, 19 images · non-contrast
Comparison: None.

CLINICAL DATA: 40-year-old female with chronic maxillary region
pain, pain radiating to both eyes, ears, and occipital region
headache. Dental versus sinus versus mastoid pain.

EXAM:
CT MAXILLOFACIAL WITHOUT CONTRAST
TECHNIQUE: Multidetector CT imaging of the maxillofacial structures was
performed. Multiplanar CT image reconstructions were also generated.

[Series 3: ax soft · axial · 0.31mm/px · z∈[-183,-57]mm · 10 of 73 slices shown, 13 images]
[im 5/73  brain]
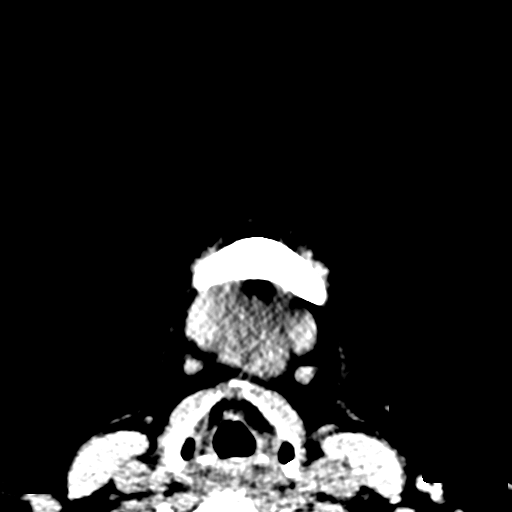
[im 5/73  bone]
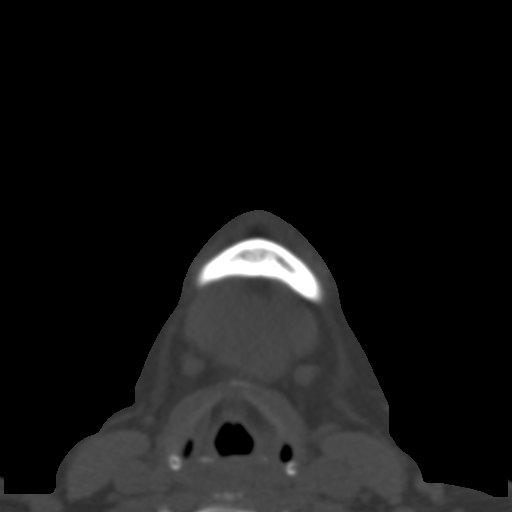
[im 13/73  bone]
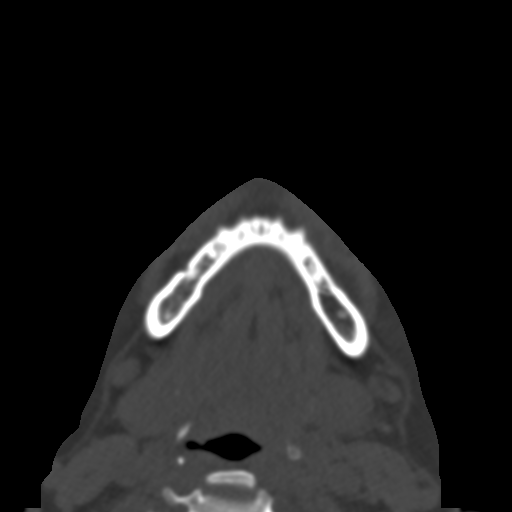
[im 20/73  bone]
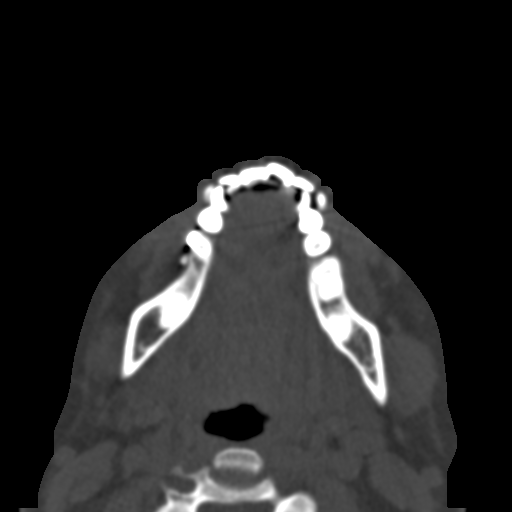
[im 25/73  bone]
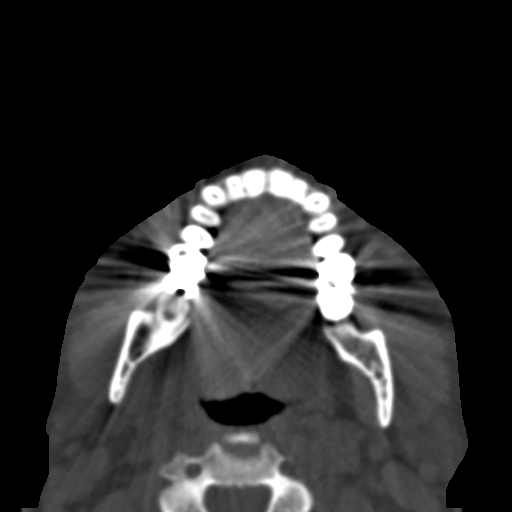
[im 33/73  brain]
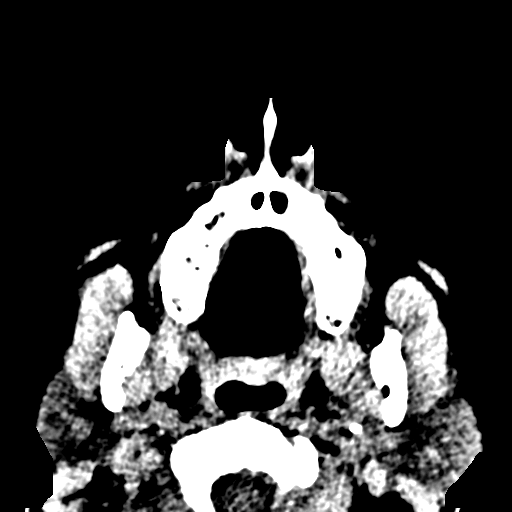
[im 33/73  bone]
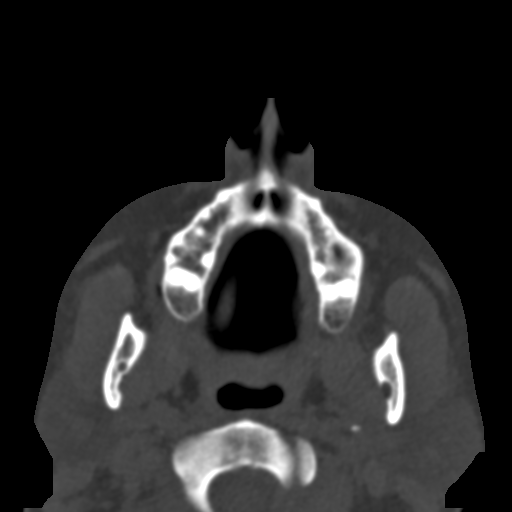
[im 40/73  bone]
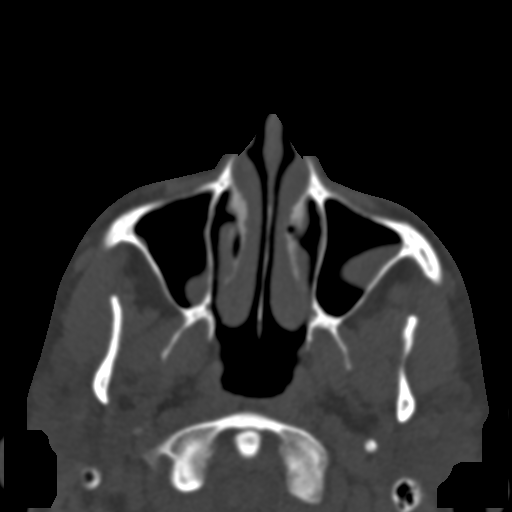
[im 48/73  bone]
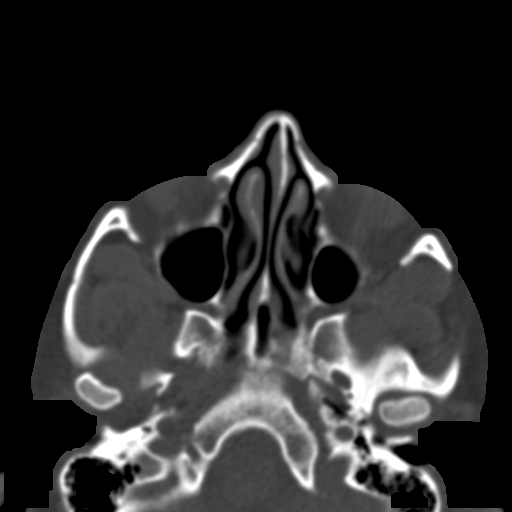
[im 55/73  bone]
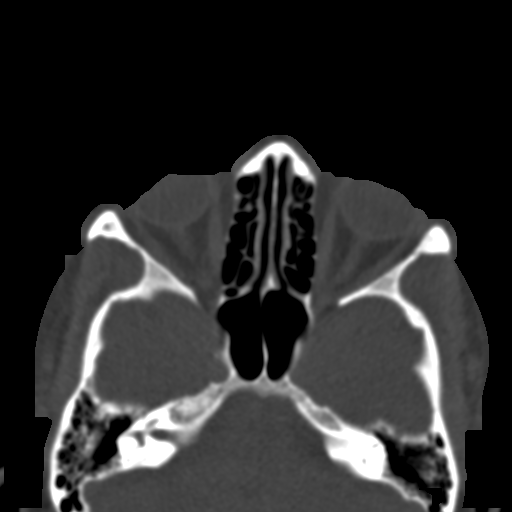
[im 60/73  brain]
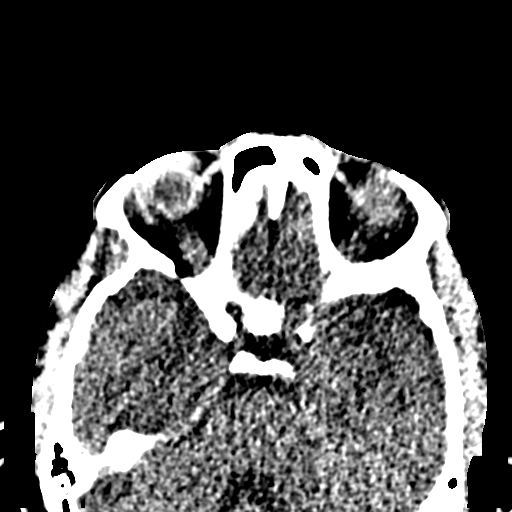
[im 60/73  bone]
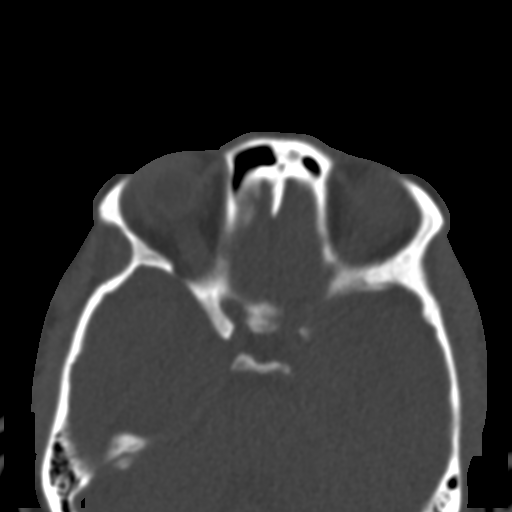
[im 68/73  bone]
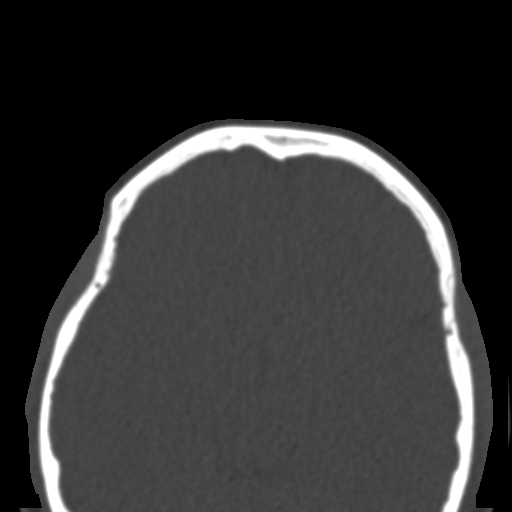

[Series 4: coronal bone · coronal · 0.34mm/px · 3 of 78 slices shown]
[im 26/78  bone]
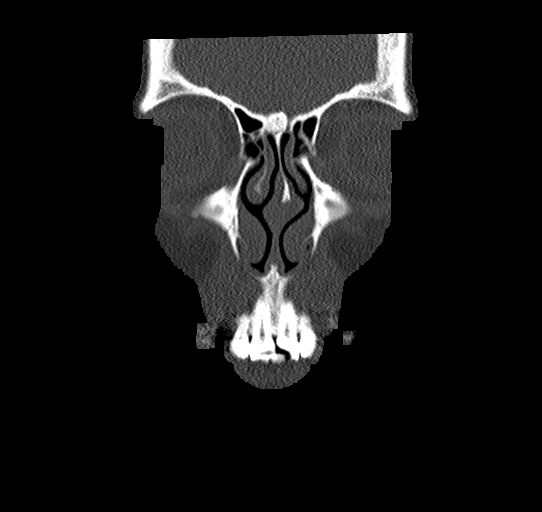
[im 35/78  bone]
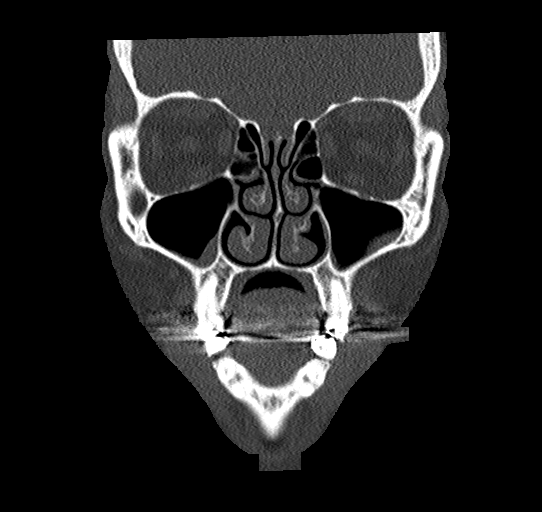
[im 43/78  bone]
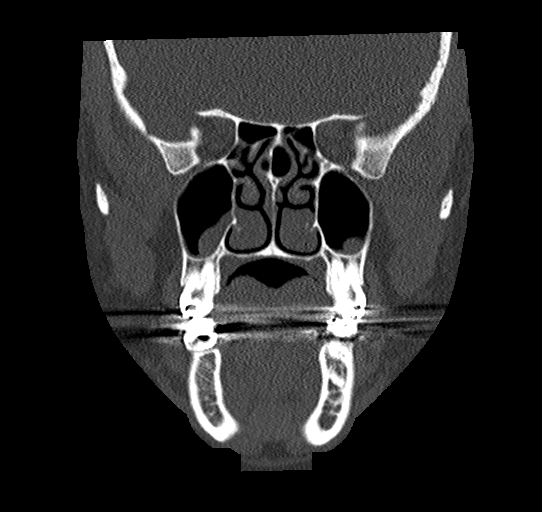

[Series 5: sagittal bone · sagittal · 0.30mm/px · 3 of 84 slices shown]
[im 28/84  bone]
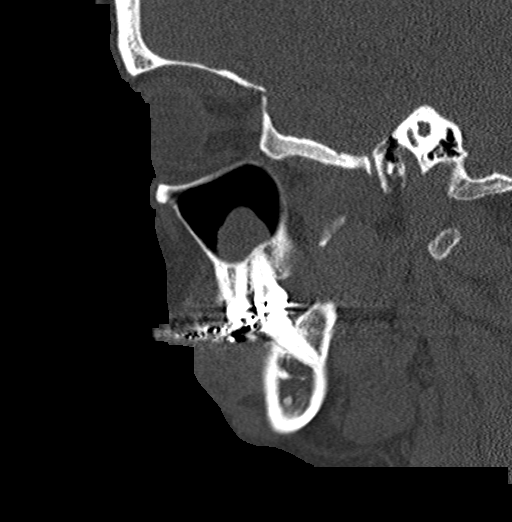
[im 42/84  bone]
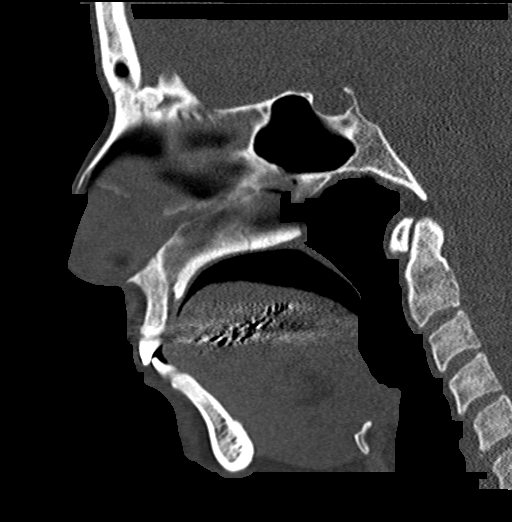
[im 56/84  bone]
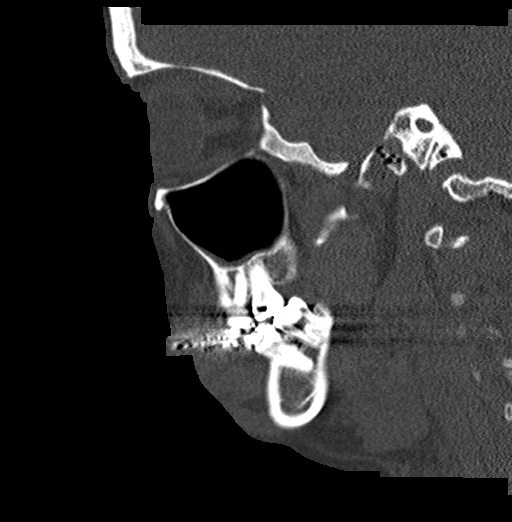

[16 of 47 positions shown; findings below may reference images not displayed]

FINDINGS: Osseous: Mandible intact. No significant dental disease identified.
No acute osseous abnormality identified.

Orbits: Orbital walls intact. Bilateral orbits soft tissues appear
normal.

Sinuses: Frontal sinuses, ethmoid sinuses, and sphenoid sinuses are
clear. There is mild left greater than right maxillary sinus mucosal
thickening, primarily affecting the alveolar recesses (coronal image
40). Both OMCs are patent (coronal image 34).

There is leftward nasal septal deviation. There is symmetric
appearing nasal cavity mucosal thickening (coronal image 40).
Pneumatized olfactory recesses. No fluid level or bubbly opacity
identified.

Bilateral tympanic cavities and visible mastoid air cells are clear.

Soft tissues: Negative visualized noncontrast larynx, pharynx,
parapharyngeal spaces, retropharyngeal space, sublingual space,
submandibular spaces and parotid spaces. Visualized scalp soft
tissues are within normal limits. No upper cervical lymphadenopathy.

Limited intracranial: Negative visualized noncontrast brain
parenchyma.
IMPRESSION: 1. Symmetric nasal cavity mucosal thickening raising the possibility
of Rhinitis. Only mild bilateral maxillary sinus mucosal thickening,
and the remaining paranasal sinuses, the middle ears and mastoids
are clear.
2. No significant dental disease identified. No acute osseous
abnormality.
3. Negative visualized noncontrast face soft tissues and brain
parenchyma.

## 2019-06-18 ENCOUNTER — Other Ambulatory Visit: Payer: Self-pay

## 2019-06-18 ENCOUNTER — Ambulatory Visit (INDEPENDENT_AMBULATORY_CARE_PROVIDER_SITE_OTHER): Payer: BC Managed Care – PPO

## 2019-06-18 DIAGNOSIS — Z23 Encounter for immunization: Secondary | ICD-10-CM | POA: Diagnosis not present

## 2019-06-29 ENCOUNTER — Ambulatory Visit (INDEPENDENT_AMBULATORY_CARE_PROVIDER_SITE_OTHER): Payer: BC Managed Care – PPO | Admitting: Family Medicine

## 2019-06-29 ENCOUNTER — Telehealth: Payer: Self-pay | Admitting: Family Medicine

## 2019-06-29 ENCOUNTER — Other Ambulatory Visit: Payer: Self-pay

## 2019-06-29 ENCOUNTER — Encounter: Payer: Self-pay | Admitting: Family Medicine

## 2019-06-29 VITALS — BP 106/62 | HR 62 | Temp 97.9°F | Ht 62.0 in | Wt 140.0 lb

## 2019-06-29 DIAGNOSIS — R0981 Nasal congestion: Secondary | ICD-10-CM

## 2019-06-29 DIAGNOSIS — Z0000 Encounter for general adult medical examination without abnormal findings: Secondary | ICD-10-CM

## 2019-06-29 DIAGNOSIS — R1031 Right lower quadrant pain: Secondary | ICD-10-CM

## 2019-06-29 DIAGNOSIS — Z0001 Encounter for general adult medical examination with abnormal findings: Secondary | ICD-10-CM

## 2019-06-29 LAB — COMPREHENSIVE METABOLIC PANEL
ALT: 20 U/L (ref 0–35)
AST: 17 U/L (ref 0–37)
Albumin: 4.4 g/dL (ref 3.5–5.2)
Alkaline Phosphatase: 50 U/L (ref 39–117)
BUN: 8 mg/dL (ref 6–23)
CO2: 27 mEq/L (ref 19–32)
Calcium: 9.4 mg/dL (ref 8.4–10.5)
Chloride: 103 mEq/L (ref 96–112)
Creatinine, Ser: 0.61 mg/dL (ref 0.40–1.20)
GFR: 106.15 mL/min (ref >60.00)
Glucose, Bld: 90 mg/dL (ref 70–99)
Potassium: 3.6 mEq/L (ref 3.5–5.1)
Sodium: 137 mEq/L (ref 135–145)
Total Bilirubin: 1.1 mg/dL (ref 0.2–1.2)
Total Protein: 7.1 g/dL (ref 6.0–8.3)

## 2019-06-29 LAB — CBC WITH DIFFERENTIAL/PLATELET
Basophils Absolute: 0 10*3/uL (ref 0.0–0.1)
Basophils Relative: 0.3 % (ref 0.0–3.0)
Eosinophils Absolute: 0.1 10*3/uL (ref 0.0–0.7)
Eosinophils Relative: 1.8 % (ref 0.0–5.0)
HCT: 40.7 % (ref 36.0–46.0)
Hemoglobin: 13.7 g/dL (ref 12.0–15.0)
Lymphocytes Relative: 27.7 % (ref 12.0–46.0)
Lymphs Abs: 1.8 10*3/uL (ref 0.7–4.0)
MCHC: 33.7 g/dL (ref 30.0–36.0)
MCV: 87.5 fl (ref 78.0–100.0)
Monocytes Absolute: 0.4 10*3/uL (ref 0.1–1.0)
Monocytes Relative: 6.4 % (ref 3.0–12.0)
Neutro Abs: 4.1 10*3/uL (ref 1.4–7.7)
Neutrophils Relative %: 63.8 % (ref 43.0–77.0)
Platelets: 246 10*3/uL (ref 150.0–400.0)
RBC: 4.66 Mil/uL (ref 3.87–5.11)
RDW: 13.6 % (ref 11.5–15.5)
WBC: 6.5 10*3/uL (ref 4.0–10.5)

## 2019-06-29 LAB — LIPID PANEL
Cholesterol: 204 mg/dL — ABNORMAL HIGH (ref 0–200)
HDL: 43.7 mg/dL (ref >39.00)
LDL Cholesterol: 147 mg/dL — ABNORMAL HIGH (ref 0–99)
NonHDL: 159.88
Total CHOL/HDL Ratio: 5
Triglycerides: 65 mg/dL (ref 0.0–149.0)
VLDL: 13 mg/dL (ref 0.0–40.0)

## 2019-06-29 LAB — TSH: TSH: 1.46 u[IU]/mL (ref 0.35–4.50)

## 2019-06-29 MED ORDER — MONTELUKAST SODIUM 10 MG PO TABS: 10.0000 mg | ORAL_TABLET | Freq: Every day | ORAL | 1 refills | Status: DC

## 2019-06-29 MED ORDER — AZELASTINE HCL 0.1 % NA SOLN: 1.0000 | Freq: Two times a day (BID) | NASAL | 3 refills | Status: DC

## 2019-06-29 NOTE — Progress Notes (Signed)
Patient: Sierra Hodge MRN: [id number] DOB: 1976/01/16 PCP: Orma Flaming, MD     Subjective:  Chief Complaint  Patient presents with  . Annual Exam    HPI: The patient is a 43 y.o. female who presents today for annual exam. She denies any changes to past medical history. There have been no recent hospitalizations. They are following a well balanced diet and exercise plan. Weight has been stable. Multiple complaints today  She is still having intermittent right sided abdominal pain. We have worked this up and her labs/CT abdomen/pelvis were all negative. She has intermittent pain in her right lower lateral side. Pain is rated as a 2/10, but at it's worst it is a 6/10. Described as a dull pain. No radiation. Can last all day when it comes. Nothing makes it better or worse. She doesn't take anything for the pain. She can not think of any precipitating factor. Not related to food. No change in bowel habits.   Congestion: already sees allergist. On nasal spray and allergy pill. She doesn't sound congested nor have complaints of allergies. Wants to discuss this even though already managed by allergist.   Immunization History  Administered Date(s) Administered  . Influenza,inj,Quad PF,6+ Mos 06/22/2016, 06/24/2017, 06/26/2018, 06/18/2019  . MMR 04/29/1975, 02/08/2001  . OPV 03/03/1988  . Td 12/06/1979, 08/28/2001  . Tdap 02/27/2014   Colonoscopy: routine screening at age 86 years. Mammogram: 02/2019 Pap smear: 10/20/2018 hiv done Flu done: 06/18/2019. Scanned into chart.   Review of Systems  Constitutional: Positive for fatigue. Negative for chills and fever.  HENT: Positive for congestion. Negative for dental problem, ear pain, hearing loss and trouble swallowing.   Eyes: Negative for visual disturbance.  Respiratory: Negative for cough, chest tightness and shortness of breath.   Cardiovascular: Negative for chest pain, palpitations and leg swelling.  Gastrointestinal: Negative  for abdominal pain, blood in stool, diarrhea, nausea and vomiting.  Endocrine: Negative for cold intolerance, polydipsia, polyphagia and polyuria.  Genitourinary: Negative for dysuria and hematuria.  Musculoskeletal: Negative for arthralgias.  Skin: Negative for rash.  Neurological: Positive for headaches. Negative for dizziness.  Psychiatric/Behavioral: Negative for dysphoric mood and sleep disturbance. The patient is not nervous/anxious.     Allergies Patient is allergic to morphine and related and mobic [meloxicam].  Past Medical History Patient  has a past medical history of Chronic interstitial cystitis, Complication of anesthesia, Endometriosis, Headache, Hyperlipidemia, IUD (intrauterine device) in place, and Other psoriasis.  Surgical History Patient  has a past surgical history that includes Cesarean section; Tubal ligation (2010); Tonsillectomy; laparoscopy (03/29/2012); cysto with hydrodistension (03/29/2012); Umbilical hernia repair (10/2010); Cholecystectomy (N/A, 07/07/2017); and Esophagogastroduodenoscopy (2018).  Family History Pateint's family history includes Allergic rhinitis in her mother; Alzheimer's disease in her father; Diabetes in her paternal uncle; Heart disease in an other family member; Hyperlipidemia in her mother and sister; Migraines in her mother.  Social History Patient  reports that she has never smoked. She has never used smokeless tobacco. She reports current alcohol use. She reports that she does not use drugs.    Objective: Vitals:   06/29/19 0808  BP: 106/62  Pulse: 62  Temp: 97.9 F (36.6 C)  TempSrc: Skin  SpO2: 98%  Weight: 140 lb (63.5 kg)  Height: 5' 2"  (1.575 m)    Body mass index is 25.61 kg/m.  Physical Exam Vitals signs reviewed.  Constitutional:      Appearance: Normal appearance. She is well-developed.  HENT:     Head: Normocephalic and  atraumatic.     Comments: No ttp over sinuses     Right Ear: Tympanic membrane, ear  canal and external ear normal.     Left Ear: Ear canal and external ear normal.     Nose: Nose normal. No congestion.     Mouth/Throat:     Mouth: Mucous membranes are moist.  Eyes:     Extraocular Movements: Extraocular movements intact.     Conjunctiva/sclera: Conjunctivae normal.     Pupils: Pupils are equal, round, and reactive to light.  Neck:     Musculoskeletal: Normal range of motion and neck supple.     Thyroid: No thyromegaly.  Cardiovascular:     Rate and Rhythm: Normal rate and regular rhythm.     Pulses: Normal pulses.     Heart sounds: Normal heart sounds. No murmur.  Pulmonary:     Effort: Pulmonary effort is normal.     Breath sounds: Normal breath sounds.  Abdominal:     General: Abdomen is flat. Bowel sounds are normal. There is no distension.     Palpations: Abdomen is soft. There is no mass.     Tenderness: There is no abdominal tenderness. There is no guarding or rebound.     Hernia: No hernia is present.     Comments: Area of concern is circled and is located in right lower quadrant more on lateral side. No pain today   Lymphadenopathy:     Cervical: No cervical adenopathy.  Skin:    General: Skin is warm and dry.     Capillary Refill: Capillary refill takes less than 2 seconds.     Findings: No rash.  Neurological:     General: No focal deficit present.     Mental Status: She is alert and oriented to person, place, and time.     Cranial Nerves: No cranial nerve deficit.     Coordination: Coordination normal.     Deep Tendon Reflexes: Reflexes normal.  Psychiatric:        Mood and Affect: Mood normal.        Behavior: Behavior normal.    Depression screen [facility] 2/9 06/29/2019 03/13/2018 06/24/2017 06/22/2016  Decreased Interest 0 0 0 0  Down, Depressed, Hopeless 0 0 0 0  PHQ - 2 Score 0 0 0 0       Assessment/plan: 1. Annual physical exam Routine fasting labs today.  UTD on her health maintenance. Has had her mmg and requested these records. Again,  recommended healthy diet/exercise. F/u in one year or as needed. Has had flu shot. Patient counseling [x]    Nutrition: Stressed importance of moderation in sodium/caffeine intake, saturated fat and cholesterol, caloric balance, sufficient intake of fresh fruits, vegetables, fiber, calcium, iron, and 1 mg of folate supplement per day (for females capable of pregnancy).  [x]    Stressed the importance of regular exercise.   []    Substance Abuse: Discussed cessation/primary prevention of tobacco, alcohol, or other drug use; driving or other dangerous activities under the influence; availability of treatment for abuse.   [x]    Injury prevention: Discussed safety belts, safety helmets, smoke detector, smoking near bedding or upholstery.   [x]    Sexuality: Discussed sexually transmitted diseases, partner selection, use of condoms, avoidance of unintended pregnancy  and contraceptive alternatives.  [x]    Dental health: Discussed importance of regular tooth brushing, flossing, and dental visits.  [x]    Health maintenance and immunizations reviewed. Please refer to Health maintenance section.    -  CBC with Differential/Platelet - Comprehensive metabolic panel - Lipid panel - TSH  2. Right lower quadrant abdominal pain Has been a negative work up. Asked her to keep a journal to see if she can correlate this with hormones (she has no period with IUD), position/exercise, BM (r/o constipation) food and how long it lasts for. Discussed we could try muscle relaxer or even gabapentin, but would really like journal done first. She does not want any medication. F/u as needed.   3. Head congestion Already sees allergist. Advised her to f/u with him. Also she has no signs of congestion on exam today. I did give her singulair to try as the anti histamines really make her IC flair per patient. Will see if Singulair will help and will also try astelin spray. Recommended f/u with allergist.     Return in about 1 year  (around 06/28/2020).     Orma Flaming, MD [place]  06/29/2019

## 2019-06-29 NOTE — Telephone Encounter (Signed)
Patient is requesting lab results from today's appt, 06/29/19, be mailed to her address on file.

## 2019-06-29 NOTE — Patient Instructions (Signed)
For your nasal spray: continue with the spray you have and I sent in a new spray for you to take in the AM. Continue your other allergy medication.   Stomach pain: CT negative. I would keep journal and see if related to hormones or if you can find other precipitating event. I also would try heating pad or ibuprofen. We could do trial of muscle relaxer or gabapentin if this continues to be a bother.    Preventive Care 68-45 Years Old, Female Preventive care refers to visits with your health care provider and lifestyle choices that can promote health and wellness. This includes:  A yearly physical exam. This may also be called an annual well check.  Regular dental visits and eye exams.  Immunizations.  Screening for certain conditions.  Healthy lifestyle choices, such as eating a healthy diet, getting regular exercise, not using drugs or products that contain nicotine and tobacco, and limiting alcohol use. What can I expect for my preventive care visit? Physical exam Your health care provider will check your:  Height and weight. This may be used to calculate body mass index (BMI), which tells if you are at a healthy weight.  Heart rate and blood pressure.  Skin for abnormal spots. Counseling Your health care provider may ask you questions about your:  Alcohol, tobacco, and drug use.  Emotional well-being.  Home and relationship well-being.  Sexual activity.  Eating habits.  Work and work Statistician.  Method of birth control.  Menstrual cycle.  Pregnancy history. What immunizations do I need?  Influenza (flu) vaccine  This is recommended every year. Tetanus, diphtheria, and pertussis (Tdap) vaccine  You may need a Td booster every 10 years. Varicella (chickenpox) vaccine  You may need this if you have not been vaccinated. Zoster (shingles) vaccine  You may need this after age 16. Measles, mumps, and rubella (MMR) vaccine  You may need at least one dose of  MMR if you were born in 1957 or later. You may also need a second dose. Pneumococcal conjugate (PCV13) vaccine  You may need this if you have certain conditions and were not previously vaccinated. Pneumococcal polysaccharide (PPSV23) vaccine  You may need one or two doses if you smoke cigarettes or if you have certain conditions. Meningococcal conjugate (MenACWY) vaccine  You may need this if you have certain conditions. Hepatitis A vaccine  You may need this if you have certain conditions or if you travel or work in places where you may be exposed to hepatitis A. Hepatitis B vaccine  You may need this if you have certain conditions or if you travel or work in places where you may be exposed to hepatitis B. Haemophilus influenzae type b (Hib) vaccine  You may need this if you have certain conditions. Human papillomavirus (HPV) vaccine  If recommended by your health care provider, you may need three doses over 6 months. You may receive vaccines as individual doses or as more than one vaccine together in one shot (combination vaccines). Talk with your health care provider about the risks and benefits of combination vaccines. What tests do I need? Blood tests  Lipid and cholesterol levels. These may be checked every 5 years, or more frequently if you are over 69 years old.  Hepatitis C test.  Hepatitis B test. Screening  Lung cancer screening. You may have this screening every year starting at age 68 if you have a 30-pack-year history of smoking and currently smoke or have quit within the  past 15 years.  Colorectal cancer screening. All adults should have this screening starting at age 28 and continuing until age 9. Your health care provider may recommend screening at age 20 if you are at increased risk. You will have tests every 1-10 years, depending on your results and the type of screening test.  Diabetes screening. This is done by checking your blood sugar (glucose) after you  have not eaten for a while (fasting). You may have this done every 1-3 years.  Mammogram. This may be done every 1-2 years. Talk with your health care provider about when you should start having regular mammograms. This may depend on whether you have a family history of breast cancer.  BRCA-related cancer screening. This may be done if you have a family history of breast, ovarian, tubal, or peritoneal cancers.  Pelvic exam and Pap test. This may be done every 3 years starting at age 16. Starting at age 57, this may be done every 5 years if you have a Pap test in combination with an HPV test. Other tests  Sexually transmitted disease (STD) testing.  Bone density scan. This is done to screen for osteoporosis. You may have this scan if you are at high risk for osteoporosis. Follow these instructions at home: Eating and drinking  Eat a diet that includes fresh fruits and vegetables, whole grains, lean protein, and low-fat dairy.  Take vitamin and mineral supplements as recommended by your health care provider.  Do not drink alcohol if: ? Your health care provider tells you not to drink. ? You are pregnant, may be pregnant, or are planning to become pregnant.  If you drink alcohol: ? Limit how much you have to 0-1 drink a day. ? Be aware of how much alcohol is in your drink. In the [place] one drink equals one 12 oz bottle of beer (355 mL), one 5 oz glass of wine (148 mL), or one 1 oz glass of hard liquor (44 mL). Lifestyle  Take daily care of your teeth and gums.  Stay active. Exercise for at least 30 minutes on 5 or more days each week.  Do not use any products that contain nicotine or tobacco, such as cigarettes, e-cigarettes, and chewing tobacco. If you need help quitting, ask your health care provider.  If you are sexually active, practice safe sex. Use a condom or other form of birth control (contraception) in order to prevent pregnancy and STIs (sexually transmitted infections).   If told by your health care provider, take low-dose aspirin daily starting at age 62. What's next?  Visit your health care provider once a year for a well check visit.  Ask your health care provider how often you should have your eyes and teeth checked.  Stay up to date on all vaccines. This information is not intended to replace advice given to you by your health care provider. Make sure you discuss any questions you have with your health care provider. Document Released: 10/03/2015 Document Revised: 05/18/2018 Document Reviewed: 05/18/2018 Elsevier Patient Education  2020 [employer].

## 2019-06-29 NOTE — Telephone Encounter (Signed)
Will mail labs to patient after they have been resulted.

## 2019-07-03 ENCOUNTER — Other Ambulatory Visit: Payer: Self-pay | Admitting: Allergy and Immunology

## 2019-08-07 DIAGNOSIS — N3010 Interstitial cystitis (chronic) without hematuria: Secondary | ICD-10-CM | POA: Diagnosis not present

## 2019-08-07 DIAGNOSIS — R102 Pelvic and perineal pain: Secondary | ICD-10-CM | POA: Diagnosis not present

## 2019-08-10 ENCOUNTER — Encounter: Payer: Self-pay | Admitting: Internal Medicine

## 2019-08-10 ENCOUNTER — Ambulatory Visit: Payer: BC Managed Care – PPO | Admitting: Internal Medicine

## 2019-08-10 ENCOUNTER — Other Ambulatory Visit: Payer: Self-pay

## 2019-08-10 VITALS — BP 92/68 | HR 78 | Temp 97.8°F | Ht 59.0 in | Wt 142.0 lb

## 2019-08-10 DIAGNOSIS — K760 Fatty (change of) liver, not elsewhere classified: Secondary | ICD-10-CM

## 2019-08-10 DIAGNOSIS — R1011 Right upper quadrant pain: Secondary | ICD-10-CM

## 2019-08-10 DIAGNOSIS — E663 Overweight: Secondary | ICD-10-CM | POA: Diagnosis not present

## 2019-08-10 DIAGNOSIS — R1013 Epigastric pain: Secondary | ICD-10-CM

## 2019-08-10 DIAGNOSIS — Z6828 Body mass index (BMI) 28.0-28.9, adult: Secondary | ICD-10-CM

## 2019-08-10 NOTE — Patient Instructions (Addendum)
As we discussed the fatty liver does not seem to be a problem but it would be good to lose weight to reduce that.  It may be related to the pain on the right side.  You should avoid foods that bother you like the apples bananas and cucumbers and look for other foods that cause gas and stomach pains.  You may try FD Donald Prose when you have problems to see if that helps.  This can be purchased over-the-counter if it works.  Another one  to consider trying if that one does not work is [place] and that can be purchased over-the-counter as well.  Please see the nutrition information below and I have also printed a Mediterranean diet instruction sheet.  That is a good one to use.   Healthy and nutritious eating and weight loss are made to be harder than they need to be. A simplified diet approach based around eating normally, as much as you want without restricting intake too much is better, I think. You must avoid packaged foods and try to eat real foods as much as possible. Packaged foods, sugary sodas, highly processed foods taste great but are slow poisons that lead to obesity and/or poor health.  It is very helpful to take some time each week and plan your meals. Work with your spouse, partner, family to do this as much as possible. Preparing meals ahead to take to work or school is especially helpful and will save money, too. You can do this and by working together it can take less time.  Some resources that I like are:  www.gaplesinstitute.org (Do the learning modules about healthy eating)  www.dietdoctor.com - helps with low carb diets and also can learn about and consider intermittent  fasting. If you have diabetes would not do intermittent fasting without checking with your doctor. Best to change what you eat before doing this.  Here are some guidelines to help you with meal planning -  Avoid or at least minimize  all processed and packaged foods (bread, pasta, crackers, chips, etc) and beverages  containing calories.  Avoid added sugars and excessive natural sugars.  Attention to how you feel if you consume artificial sweeteners.  Do they make you more hungry ?  Pay attention to foods that give you stomach symptoms - keep a log or diary if you can.  Try to plan your meals for the week and involve the family. Meal prep can be a fun family time together and teach the kids useful skills, while reducing the time for the cook.  I appreciate the opportunity to care for you. Gatha Mayer, MD, [facility]

## 2019-08-10 NOTE — Progress Notes (Signed)
Sierra Hodge 42 y.o. [birth year] [id number]  Assessment & Plan:   NAFLD (nonalcoholic fatty liver disease) She has this by imaging, it is not significantly affecting her.  We talked about the potential long-term problems and how weight loss is the best course at this time.  There were no concerning findings like thrombocytopenia and her transaminases are normal.  She is mildly overweight only.  This is increased in people of Hispanic heritage.  She will try to lose weight.  This could be contributing some to her right upper quadrant pain but I am not sure.  Periodic LFTs once or twice a year makes sense.  Overweight with body mass index (BMI) of 28 to 28.9 in adult We discussed the need to try to lose 10 pounds at least, she will work on this.  [employer] and [employer]. Website information provided.  Recommending low-carb diet could consider restricted feeding/intermittent fasting  Dyspepsia She has dyspepsia to gas-forming foods it sounds like.  Diet modification is recommended.  Reassured.  Negative EGD 2018.  RUQ pain Chronic intermittent.  Could be related to fatty liver.  Could be musculoskeletal.  CT scan EGD findings are reassuring.     Subjective:   Chief Complaint: Fatty liver  HPI 43 year old Hispanic woman known to me from prior upper abdominal pain issues, negative EGD 2018.  Had a CT scan which demonstrated changes of fatty liver.  I reviewed the images with her in the office today.  LFTs and platelets are normal.  Symptom issues include epigastric pain and belching and burping after certain types of vegetables.  She has an intermittent right upper quadrant pain almost right flank pain.  Mild but disturbing this is been chronic and recurrent.   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 No acute findings within the abdomen or pelvis. 2. Mild hepatic steatosis.   Electronically Signed   By: Myles Rosenthal M.D.   On: 02/23/2019 14:13  NL EGD 2018  Lab Results  Component Value Date   CHOL 204 (H) 06/29/2019   CHOL 205 (H) 06/26/2018   CHOL 207 (H) 06/27/2017   Lab Results  Component Value Date   HDL 43.70 06/29/2019   HDL 45.60 06/26/2018   HDL 46.10 06/27/2017   Lab Results  Component Value Date   LDLCALC 147 (H) 06/29/2019   LDLCALC 146 (H) 06/26/2018   LDLCALC 143 (H) 06/27/2017   Lab Results  Component Value Date   TRIG 65.0 06/29/2019   TRIG 67.0 06/26/2018   TRIG 88.0 06/27/2017   Lab Results  Component Value Date   CHOLHDL 5 06/29/2019   CHOLHDL 5 06/26/2018   CHOLHDL 4 06/27/2017   Lab Results  Component Value Date   LDLDIRECT 155.7 09/11/2013    Allergies  Allergen Reactions  . Morphine And Related Itching  . Mobic [Meloxicam] Other (See Comments)    bloating   Current Meds  Medication Sig  . albuterol (PROVENTIL HFA;VENTOLIN HFA) 108 (90 Base) MCG/ACT inhaler Inhale 2 puffs into the lungs every  6 (six) hours as needed for wheezing or shortness of breath.  . clobetasol ointment (TEMOVATE) 0.05 % Apply topically daily as needed.  . diclofenac sodium (VOLTAREN) 1 % GEL Apply topically as needed.  Marland Kitchen ibuprofen (ADVIL,MOTRIN) 200 MG tablet Take 400-600 mg by mouth as needed.  Marland Kitchen levonorgestrel (MIRENA) 20 MCG/24HR IUD 1 each by Intrauterine route once.  . Omega-3 1000 MG CAPS Take by mouth.  . pentosan polysulfate (ELMIRON) 100 MG capsule Take 100 mg by mouth 3 (three) times daily. rx per gyn  . triamcinolone lotion (KENALOG) 0.1 % triamcinolone acetonide 0.1 % lotion  . VITAMIN D PO Take by mouth daily.  . Vitamin E 100 units  TABS Take by mouth.  [name] 93 MCG/ACT Primrose.Oscar    Past Medical History:  Diagnosis Date  . Chronic interstitial cystitis   . Complication of anesthesia    hard to wake after anesthesia  . Endometriosis   . Fatty liver   . Headache   . Hyperlipidemia   . IUD (intrauterine device) in place    Mirena, Dr. Leo Grosser  . Other psoriasis    Past Surgical History:  Procedure Laterality Date  . CESAREAN SECTION     2008, 2010  . CHOLECYSTECTOMY N/A 07/07/2017   Procedure: LAPAROSCOPIC CHOLECYSTECTOMY;  Surgeon: Coralie Keens, MD;  Location: [facility];  Service: General;  Laterality: N/A;  . CYSTO WITH HYDRODISTENSION  03/29/2012   Procedure: CYSTOSCOPY/HYDRODISTENSION;  Surgeon: Reece Packer, MD;  Location: [facility];  Service: Urology;  Laterality: N/A;  with Instillation of Marcaine and Pyridium   . ESOPHAGOGASTRODUODENOSCOPY  2018  . LAPAROSCOPY  03/29/2012   Procedure: LAPAROSCOPY OPERATIVE;  Surgeon: Eldred Manges, MD;  Location: [facility];  Service: Gynecology;  Laterality: N/A;  with Fulguration of Endometriosis and Peritoneal Biopsy  . TONSILLECTOMY    . TUBAL LIGATION  2010  . UMBILICAL HERNIA REPAIR  10/2010   Social History   Social History Narrative   Married    Employed as a Administrator, sports,  mainly medical   From [place] ; 2 daughters 2008, 2010    Rare alcohol, never smoker no drug use   Right handed   Caffeine: 1 or 2 cups a week   family history includes Allergic rhinitis in her mother; Alzheimer's disease in her father; Diabetes in her paternal uncle; Heart disease in an other family member; Hyperlipidemia in her mother and sister; Migraines in her mother.   Review of Systems As per HPI  Objective:   Physical Exam BP 92/68   Pulse 78   Temp 97.8 F (36.6 C)   Ht 4\' 11"  (1.499 m)   Wt 142 lb (64.4 kg)   BMI 28.68 kg/m  Well-developed well-nourished Hispanic woman in no acute distress Eyes anicteric No CVA tenderness no  significant rib tenderness The abdomen is soft and nontender without organomegaly or mass

## 2019-08-11 ENCOUNTER — Encounter: Payer: Self-pay | Admitting: Internal Medicine

## 2019-08-11 DIAGNOSIS — Z6828 Body mass index (BMI) 28.0-28.9, adult: Secondary | ICD-10-CM | POA: Insufficient documentation

## 2019-08-11 DIAGNOSIS — K760 Fatty (change of) liver, not elsewhere classified: Secondary | ICD-10-CM | POA: Insufficient documentation

## 2019-08-11 DIAGNOSIS — R1011 Right upper quadrant pain: Secondary | ICD-10-CM | POA: Insufficient documentation

## 2019-08-11 DIAGNOSIS — E663 Overweight: Secondary | ICD-10-CM | POA: Insufficient documentation

## 2019-08-11 DIAGNOSIS — R1013 Epigastric pain: Secondary | ICD-10-CM | POA: Insufficient documentation

## 2019-08-11 NOTE — Assessment & Plan Note (Signed)
Chronic intermittent.  Could be related to fatty liver.  Could be musculoskeletal.  CT scan EGD findings are reassuring.

## 2019-08-11 NOTE — Assessment & Plan Note (Signed)
She has this by imaging, it is not significantly affecting her.  We talked about the potential long-term problems and how weight loss is the best course at this time.  There were no concerning findings like thrombocytopenia and her transaminases are normal.  She is mildly overweight only.  This is increased in people of Hispanic heritage.  She will try to lose weight.  This could be contributing some to her right upper quadrant pain but I am not sure.  Periodic LFTs once or twice a year makes sense.

## 2019-08-11 NOTE — Assessment & Plan Note (Signed)
We discussed the need to try to lose 10 pounds at least, she will work on this.  [employer] and [employer]. Website information provided.  Recommending low-carb diet could consider restricted feeding/intermittent fasting

## 2019-08-11 NOTE — Assessment & Plan Note (Signed)
She has dyspepsia to gas-forming foods it sounds like.  Diet modification is recommended.  Reassured.  Negative EGD 2018.

## 2019-09-26 DIAGNOSIS — L409 Psoriasis, unspecified: Secondary | ICD-10-CM | POA: Diagnosis not present

## 2019-09-26 DIAGNOSIS — R102 Pelvic and perineal pain: Secondary | ICD-10-CM | POA: Diagnosis not present

## 2019-09-26 DIAGNOSIS — N941 Unspecified dyspareunia: Secondary | ICD-10-CM | POA: Diagnosis not present

## 2019-09-26 DIAGNOSIS — R350 Frequency of micturition: Secondary | ICD-10-CM | POA: Diagnosis not present

## 2019-09-26 DIAGNOSIS — N3010 Interstitial cystitis (chronic) without hematuria: Secondary | ICD-10-CM | POA: Diagnosis not present

## 2019-09-26 DIAGNOSIS — N803 Endometriosis of pelvic peritoneum: Secondary | ICD-10-CM | POA: Diagnosis not present

## 2019-09-26 DIAGNOSIS — R351 Nocturia: Secondary | ICD-10-CM | POA: Diagnosis not present

## 2019-09-26 DIAGNOSIS — M62838 Other muscle spasm: Secondary | ICD-10-CM | POA: Diagnosis not present

## 2019-09-26 DIAGNOSIS — N9489 Other specified conditions associated with female genital organs and menstrual cycle: Secondary | ICD-10-CM | POA: Diagnosis not present

## 2019-09-26 DIAGNOSIS — F419 Anxiety disorder, unspecified: Secondary | ICD-10-CM | POA: Diagnosis not present

## 2019-10-04 DIAGNOSIS — R350 Frequency of micturition: Secondary | ICD-10-CM | POA: Diagnosis not present

## 2019-10-04 DIAGNOSIS — N3010 Interstitial cystitis (chronic) without hematuria: Secondary | ICD-10-CM | POA: Diagnosis not present

## 2019-10-04 DIAGNOSIS — N9489 Other specified conditions associated with female genital organs and menstrual cycle: Secondary | ICD-10-CM | POA: Diagnosis not present

## 2019-10-04 DIAGNOSIS — R351 Nocturia: Secondary | ICD-10-CM | POA: Diagnosis not present

## 2019-10-04 DIAGNOSIS — M62838 Other muscle spasm: Secondary | ICD-10-CM | POA: Diagnosis not present

## 2019-10-04 DIAGNOSIS — N941 Unspecified dyspareunia: Secondary | ICD-10-CM | POA: Diagnosis not present

## 2019-10-04 DIAGNOSIS — R102 Pelvic and perineal pain: Secondary | ICD-10-CM | POA: Diagnosis not present

## 2019-10-16 ENCOUNTER — Other Ambulatory Visit: Payer: Self-pay | Admitting: Family Medicine

## 2019-10-16 NOTE — Telephone Encounter (Signed)
.  MEDICATION:clobetasol ointment (TEMOVATE) 0.05 %  PHARMACY:[id number] [place], [place] - [address]  Comments:   **Let patient know to contact pharmacy at the end of the day to make sure medication is ready. **  ** Please notify patient to allow 48-72 hours to process**  **Encourage patient to contact the pharmacy for refills or they can request refills through [facility]**

## 2019-10-16 NOTE — Telephone Encounter (Signed)
Last given by Dr. Drue Novel. Ok to refill?

## 2019-10-17 MED ORDER — CLOBETASOL PROPIONATE 0.05 % EX OINT: TOPICAL_OINTMENT | Freq: Every day | CUTANEOUS | 5 refills | Status: DC | PRN

## 2019-10-18 DIAGNOSIS — N3010 Interstitial cystitis (chronic) without hematuria: Secondary | ICD-10-CM | POA: Diagnosis not present

## 2019-10-18 DIAGNOSIS — R351 Nocturia: Secondary | ICD-10-CM | POA: Diagnosis not present

## 2019-10-18 DIAGNOSIS — N9489 Other specified conditions associated with female genital organs and menstrual cycle: Secondary | ICD-10-CM | POA: Diagnosis not present

## 2019-10-18 DIAGNOSIS — R102 Pelvic and perineal pain: Secondary | ICD-10-CM | POA: Diagnosis not present

## 2019-10-18 DIAGNOSIS — R350 Frequency of micturition: Secondary | ICD-10-CM | POA: Diagnosis not present

## 2019-10-18 DIAGNOSIS — N941 Unspecified dyspareunia: Secondary | ICD-10-CM | POA: Diagnosis not present

## 2019-10-18 DIAGNOSIS — M62838 Other muscle spasm: Secondary | ICD-10-CM | POA: Diagnosis not present

## 2019-10-24 DIAGNOSIS — R351 Nocturia: Secondary | ICD-10-CM | POA: Diagnosis not present

## 2019-10-24 DIAGNOSIS — M62838 Other muscle spasm: Secondary | ICD-10-CM | POA: Diagnosis not present

## 2019-10-24 DIAGNOSIS — N3010 Interstitial cystitis (chronic) without hematuria: Secondary | ICD-10-CM | POA: Diagnosis not present

## 2019-10-24 DIAGNOSIS — R350 Frequency of micturition: Secondary | ICD-10-CM | POA: Diagnosis not present

## 2019-10-24 DIAGNOSIS — R102 Pelvic and perineal pain: Secondary | ICD-10-CM | POA: Diagnosis not present

## 2019-10-24 DIAGNOSIS — N9489 Other specified conditions associated with female genital organs and menstrual cycle: Secondary | ICD-10-CM | POA: Diagnosis not present

## 2019-10-24 DIAGNOSIS — N941 Unspecified dyspareunia: Secondary | ICD-10-CM | POA: Diagnosis not present

## 2019-11-01 DIAGNOSIS — N3010 Interstitial cystitis (chronic) without hematuria: Secondary | ICD-10-CM | POA: Diagnosis not present

## 2019-11-01 DIAGNOSIS — R350 Frequency of micturition: Secondary | ICD-10-CM | POA: Diagnosis not present

## 2019-11-01 DIAGNOSIS — N941 Unspecified dyspareunia: Secondary | ICD-10-CM | POA: Diagnosis not present

## 2019-11-01 DIAGNOSIS — R102 Pelvic and perineal pain: Secondary | ICD-10-CM | POA: Diagnosis not present

## 2019-11-01 DIAGNOSIS — N9489 Other specified conditions associated with female genital organs and menstrual cycle: Secondary | ICD-10-CM | POA: Diagnosis not present

## 2019-11-01 DIAGNOSIS — R351 Nocturia: Secondary | ICD-10-CM | POA: Diagnosis not present

## 2019-11-01 DIAGNOSIS — M62838 Other muscle spasm: Secondary | ICD-10-CM | POA: Diagnosis not present

## 2019-11-07 DIAGNOSIS — N941 Unspecified dyspareunia: Secondary | ICD-10-CM | POA: Diagnosis not present

## 2019-11-07 DIAGNOSIS — R350 Frequency of micturition: Secondary | ICD-10-CM | POA: Diagnosis not present

## 2019-11-07 DIAGNOSIS — R351 Nocturia: Secondary | ICD-10-CM | POA: Diagnosis not present

## 2019-11-07 DIAGNOSIS — N3010 Interstitial cystitis (chronic) without hematuria: Secondary | ICD-10-CM | POA: Diagnosis not present

## 2019-11-07 DIAGNOSIS — N9489 Other specified conditions associated with female genital organs and menstrual cycle: Secondary | ICD-10-CM | POA: Diagnosis not present

## 2019-11-07 DIAGNOSIS — M62838 Other muscle spasm: Secondary | ICD-10-CM | POA: Diagnosis not present

## 2019-11-07 DIAGNOSIS — R102 Pelvic and perineal pain: Secondary | ICD-10-CM | POA: Diagnosis not present

## 2019-11-15 DIAGNOSIS — R102 Pelvic and perineal pain: Secondary | ICD-10-CM | POA: Diagnosis not present

## 2019-11-20 DIAGNOSIS — Z6829 Body mass index (BMI) 29.0-29.9, adult: Secondary | ICD-10-CM | POA: Diagnosis not present

## 2019-11-20 DIAGNOSIS — Z01419 Encounter for gynecological examination (general) (routine) without abnormal findings: Secondary | ICD-10-CM | POA: Diagnosis not present

## 2019-11-20 DIAGNOSIS — M62838 Other muscle spasm: Secondary | ICD-10-CM | POA: Diagnosis not present

## 2019-11-20 DIAGNOSIS — Z3040 Encounter for surveillance of contraceptives, unspecified: Secondary | ICD-10-CM | POA: Diagnosis not present

## 2019-11-20 DIAGNOSIS — N3010 Interstitial cystitis (chronic) without hematuria: Secondary | ICD-10-CM | POA: Diagnosis not present

## 2019-11-20 DIAGNOSIS — R350 Frequency of micturition: Secondary | ICD-10-CM | POA: Diagnosis not present

## 2019-11-20 DIAGNOSIS — Z113 Encounter for screening for infections with a predominantly sexual mode of transmission: Secondary | ICD-10-CM | POA: Diagnosis not present

## 2019-11-20 DIAGNOSIS — R351 Nocturia: Secondary | ICD-10-CM | POA: Diagnosis not present

## 2019-11-20 DIAGNOSIS — N9489 Other specified conditions associated with female genital organs and menstrual cycle: Secondary | ICD-10-CM | POA: Diagnosis not present

## 2019-11-20 DIAGNOSIS — N941 Unspecified dyspareunia: Secondary | ICD-10-CM | POA: Diagnosis not present

## 2019-11-20 DIAGNOSIS — R102 Pelvic and perineal pain: Secondary | ICD-10-CM | POA: Diagnosis not present

## 2019-12-05 DIAGNOSIS — R351 Nocturia: Secondary | ICD-10-CM | POA: Diagnosis not present

## 2019-12-05 DIAGNOSIS — N3010 Interstitial cystitis (chronic) without hematuria: Secondary | ICD-10-CM | POA: Diagnosis not present

## 2019-12-05 DIAGNOSIS — N9489 Other specified conditions associated with female genital organs and menstrual cycle: Secondary | ICD-10-CM | POA: Diagnosis not present

## 2019-12-05 DIAGNOSIS — R102 Pelvic and perineal pain: Secondary | ICD-10-CM | POA: Diagnosis not present

## 2019-12-05 DIAGNOSIS — R350 Frequency of micturition: Secondary | ICD-10-CM | POA: Diagnosis not present

## 2019-12-05 DIAGNOSIS — M62838 Other muscle spasm: Secondary | ICD-10-CM | POA: Diagnosis not present

## 2019-12-05 DIAGNOSIS — N941 Unspecified dyspareunia: Secondary | ICD-10-CM | POA: Diagnosis not present

## 2020-01-01 DIAGNOSIS — Z79899 Other long term (current) drug therapy: Secondary | ICD-10-CM | POA: Diagnosis not present

## 2020-01-02 DIAGNOSIS — N941 Unspecified dyspareunia: Secondary | ICD-10-CM | POA: Diagnosis not present

## 2020-01-02 DIAGNOSIS — M62838 Other muscle spasm: Secondary | ICD-10-CM | POA: Diagnosis not present

## 2020-01-02 DIAGNOSIS — R102 Pelvic and perineal pain: Secondary | ICD-10-CM | POA: Diagnosis not present

## 2020-01-02 DIAGNOSIS — N9489 Other specified conditions associated with female genital organs and menstrual cycle: Secondary | ICD-10-CM | POA: Diagnosis not present

## 2020-01-02 DIAGNOSIS — R351 Nocturia: Secondary | ICD-10-CM | POA: Diagnosis not present

## 2020-01-02 DIAGNOSIS — N3010 Interstitial cystitis (chronic) without hematuria: Secondary | ICD-10-CM | POA: Diagnosis not present

## 2020-01-02 DIAGNOSIS — R350 Frequency of micturition: Secondary | ICD-10-CM | POA: Diagnosis not present

## 2020-01-04 DIAGNOSIS — Z30432 Encounter for removal of intrauterine contraceptive device: Secondary | ICD-10-CM | POA: Diagnosis not present

## 2020-01-04 DIAGNOSIS — N941 Unspecified dyspareunia: Secondary | ICD-10-CM | POA: Diagnosis not present

## 2020-01-15 DIAGNOSIS — R350 Frequency of micturition: Secondary | ICD-10-CM | POA: Diagnosis not present

## 2020-01-15 DIAGNOSIS — N941 Unspecified dyspareunia: Secondary | ICD-10-CM | POA: Diagnosis not present

## 2020-01-15 DIAGNOSIS — R102 Pelvic and perineal pain: Secondary | ICD-10-CM | POA: Diagnosis not present

## 2020-01-15 DIAGNOSIS — N3010 Interstitial cystitis (chronic) without hematuria: Secondary | ICD-10-CM | POA: Diagnosis not present

## 2020-01-15 DIAGNOSIS — R351 Nocturia: Secondary | ICD-10-CM | POA: Diagnosis not present

## 2020-01-15 DIAGNOSIS — N9489 Other specified conditions associated with female genital organs and menstrual cycle: Secondary | ICD-10-CM | POA: Diagnosis not present

## 2020-01-15 DIAGNOSIS — M62838 Other muscle spasm: Secondary | ICD-10-CM | POA: Diagnosis not present

## 2020-01-25 DIAGNOSIS — Z30432 Encounter for removal of intrauterine contraceptive device: Secondary | ICD-10-CM | POA: Diagnosis not present

## 2020-01-25 DIAGNOSIS — Z30430 Encounter for insertion of intrauterine contraceptive device: Secondary | ICD-10-CM | POA: Diagnosis not present

## 2020-01-30 DIAGNOSIS — N941 Unspecified dyspareunia: Secondary | ICD-10-CM | POA: Diagnosis not present

## 2020-01-30 DIAGNOSIS — R351 Nocturia: Secondary | ICD-10-CM | POA: Diagnosis not present

## 2020-01-30 DIAGNOSIS — R350 Frequency of micturition: Secondary | ICD-10-CM | POA: Diagnosis not present

## 2020-01-30 DIAGNOSIS — M62838 Other muscle spasm: Secondary | ICD-10-CM | POA: Diagnosis not present

## 2020-01-30 DIAGNOSIS — R102 Pelvic and perineal pain: Secondary | ICD-10-CM | POA: Diagnosis not present

## 2020-01-30 DIAGNOSIS — N9489 Other specified conditions associated with female genital organs and menstrual cycle: Secondary | ICD-10-CM | POA: Diagnosis not present

## 2020-01-30 DIAGNOSIS — N3010 Interstitial cystitis (chronic) without hematuria: Secondary | ICD-10-CM | POA: Diagnosis not present

## 2020-02-07 DIAGNOSIS — N3010 Interstitial cystitis (chronic) without hematuria: Secondary | ICD-10-CM | POA: Diagnosis not present

## 2020-02-07 DIAGNOSIS — Z09 Encounter for follow-up examination after completed treatment for conditions other than malignant neoplasm: Secondary | ICD-10-CM | POA: Diagnosis not present

## 2020-02-07 DIAGNOSIS — N803 Endometriosis of pelvic peritoneum: Secondary | ICD-10-CM | POA: Diagnosis not present

## 2020-03-04 DIAGNOSIS — R102 Pelvic and perineal pain: Secondary | ICD-10-CM | POA: Diagnosis not present

## 2020-03-04 DIAGNOSIS — R351 Nocturia: Secondary | ICD-10-CM | POA: Diagnosis not present

## 2020-03-04 DIAGNOSIS — N941 Unspecified dyspareunia: Secondary | ICD-10-CM | POA: Diagnosis not present

## 2020-03-04 DIAGNOSIS — R350 Frequency of micturition: Secondary | ICD-10-CM | POA: Diagnosis not present

## 2020-03-04 DIAGNOSIS — M62838 Other muscle spasm: Secondary | ICD-10-CM | POA: Diagnosis not present

## 2020-03-04 DIAGNOSIS — N3010 Interstitial cystitis (chronic) without hematuria: Secondary | ICD-10-CM | POA: Diagnosis not present

## 2020-03-04 DIAGNOSIS — N9489 Other specified conditions associated with female genital organs and menstrual cycle: Secondary | ICD-10-CM | POA: Diagnosis not present

## 2020-03-18 DIAGNOSIS — R102 Pelvic and perineal pain: Secondary | ICD-10-CM | POA: Diagnosis not present

## 2020-03-18 DIAGNOSIS — R351 Nocturia: Secondary | ICD-10-CM | POA: Diagnosis not present

## 2020-03-18 DIAGNOSIS — N941 Unspecified dyspareunia: Secondary | ICD-10-CM | POA: Diagnosis not present

## 2020-03-18 DIAGNOSIS — R350 Frequency of micturition: Secondary | ICD-10-CM | POA: Diagnosis not present

## 2020-03-18 DIAGNOSIS — N3010 Interstitial cystitis (chronic) without hematuria: Secondary | ICD-10-CM | POA: Diagnosis not present

## 2020-03-18 DIAGNOSIS — N9489 Other specified conditions associated with female genital organs and menstrual cycle: Secondary | ICD-10-CM | POA: Diagnosis not present

## 2020-03-18 DIAGNOSIS — M62838 Other muscle spasm: Secondary | ICD-10-CM | POA: Diagnosis not present

## 2020-03-26 DIAGNOSIS — R102 Pelvic and perineal pain: Secondary | ICD-10-CM | POA: Diagnosis not present

## 2020-03-26 DIAGNOSIS — N3010 Interstitial cystitis (chronic) without hematuria: Secondary | ICD-10-CM | POA: Diagnosis not present

## 2020-03-26 DIAGNOSIS — N941 Unspecified dyspareunia: Secondary | ICD-10-CM | POA: Diagnosis not present

## 2020-03-26 DIAGNOSIS — R350 Frequency of micturition: Secondary | ICD-10-CM | POA: Diagnosis not present

## 2020-03-26 DIAGNOSIS — R351 Nocturia: Secondary | ICD-10-CM | POA: Diagnosis not present

## 2020-03-26 DIAGNOSIS — M6289 Other specified disorders of muscle: Secondary | ICD-10-CM | POA: Diagnosis not present

## 2020-03-26 DIAGNOSIS — M62838 Other muscle spasm: Secondary | ICD-10-CM | POA: Diagnosis not present

## 2020-04-07 DIAGNOSIS — N3010 Interstitial cystitis (chronic) without hematuria: Secondary | ICD-10-CM | POA: Diagnosis not present

## 2020-04-07 DIAGNOSIS — M62838 Other muscle spasm: Secondary | ICD-10-CM | POA: Diagnosis not present

## 2020-04-07 DIAGNOSIS — R351 Nocturia: Secondary | ICD-10-CM | POA: Diagnosis not present

## 2020-04-07 DIAGNOSIS — M6289 Other specified disorders of muscle: Secondary | ICD-10-CM | POA: Diagnosis not present

## 2020-04-07 DIAGNOSIS — R350 Frequency of micturition: Secondary | ICD-10-CM | POA: Diagnosis not present

## 2020-04-07 DIAGNOSIS — N941 Unspecified dyspareunia: Secondary | ICD-10-CM | POA: Diagnosis not present

## 2020-04-07 DIAGNOSIS — R102 Pelvic and perineal pain: Secondary | ICD-10-CM | POA: Diagnosis not present

## 2020-04-22 ENCOUNTER — Telehealth: Payer: Self-pay | Admitting: Family Medicine

## 2020-04-22 NOTE — Telephone Encounter (Signed)
Patient is flying out tomorrow to [facility] -took some Ibuprofen to which has relieved some pain.

## 2020-04-22 NOTE — Telephone Encounter (Signed)
Please schedule pt for tomorrow at 1:20.   Thanks

## 2020-04-22 NOTE — Telephone Encounter (Signed)
Nurse Assessment Nurse: Stefano Gaul, RN, Dwana Curd Date/Time (Eastern Time): 04/22/2020 8:24:53 AM Confirm and document reason for call. If symptomatic, describe symptoms. ---Caller states she has nasal congestion. has abd pain. had severe abd pain yesterday. She is taking digestive pills. when she eats, she gets a lot of gas. Using a heating pad. Pain is around her navel. Had BM. Pain level 5. Has the patient had close contact with a person known or suspected to have the novel coronavirus illness OR traveled / lives in area with major community spread (including international travel) in the last 14 days from the onset of symptoms? * If Asymptomatic, screen for exposure and travel within the last 14 days. ---No Does the patient have any new or worsening symptoms? ---Yes Will a triage be completed? ---Yes Related visit to physician within the last 2 weeks? ---No Does the PT have any chronic conditions? (i.e. diabetes, asthma, this includes High risk factors for pregnancy, etc.) ---Yes List chronic conditions. ---IC Is the patient pregnant or possibly pregnant? (Ask all females between the ages of 22-55) ---No Is this a behavioral health or substance abuse call? ---No Guidelines Guideline Title Affirmed Question Affirmed Notes Nurse Date/Time Lamount Cohen Time) Abdominal Pain - Female [1] MILD-MODERATE pain AND [2] constant Stefano Gaul, RN, Dwana Curd 04/22/2020 8:31:02 AMPLEASE NOTE: All timestamps contained within this report are represented as [place] Standard Time. CONFIDENTIALTY NOTICE: This fax transmission is intended only for the addressee. It contains information that is legally privileged, confidential or otherwise protected from use or disclosure. If you are not the intended recipient, you are strictly prohibited from reviewing, disclosing, copying using or disseminating any of this information or taking any action in reliance on or regarding this information. If you have received this fax in error,  please notify us immediately by telephone so that we can arrange for its return to [place]. Phone: [phone], Toll-Free: [phone], Fax: [phone] Page: 2 of 2 Call Id: [id number] Guidelines Guideline Title Affirmed Question Affirmed Notes Nurse Date/Time Lamount Cohen Time) AND [3] present > 2 hours Disp. Time Lamount Cohen Time) Disposition Final User 04/22/2020 8:23:45 AM Send to Urgent Queue Terrilee Files 04/22/2020 8:39:20 AM See HCP within 4 Hours (or PCP triage) Yes Stefano Gaul, RN, Clerance Lav Disagree/Comply Comply Caller Understands Yes PreDisposition Call Doctor Care Advice Given Per Guideline SEE HCP WITHIN 4 HOURS (OR PCP TRIAGE): * IF OFFICE WILL BE OPEN: You need to be seen within the next 3 or 4 hours. Call your doctor (or NP/PA) now or as soon as the office opens. * You become worse. CALL BACK IF: Comments User: Art Buff, RN Date/Time (Eastern Time): 04/22/2020 8:39:14 AM Called back line and gave report that pt had severe abd pain but she had a BM and pain is now level 5. Triage outcome see physician within 4 hrs. No appts available in any office. advised pt she needs to go to the ER or urgent care. states she is not going to urgent care or ER but wants appt for tomorrow. Please call pt back Referrals GO TO FACILITY REFUSED

## 2020-04-22 NOTE — Telephone Encounter (Signed)
FYI

## 2020-05-01 DIAGNOSIS — R350 Frequency of micturition: Secondary | ICD-10-CM | POA: Diagnosis not present

## 2020-05-01 DIAGNOSIS — M6289 Other specified disorders of muscle: Secondary | ICD-10-CM | POA: Diagnosis not present

## 2020-05-01 DIAGNOSIS — M62838 Other muscle spasm: Secondary | ICD-10-CM | POA: Diagnosis not present

## 2020-05-01 DIAGNOSIS — R351 Nocturia: Secondary | ICD-10-CM | POA: Diagnosis not present

## 2020-05-01 DIAGNOSIS — N3010 Interstitial cystitis (chronic) without hematuria: Secondary | ICD-10-CM | POA: Diagnosis not present

## 2020-05-01 DIAGNOSIS — N941 Unspecified dyspareunia: Secondary | ICD-10-CM | POA: Diagnosis not present

## 2020-05-01 DIAGNOSIS — R102 Pelvic and perineal pain: Secondary | ICD-10-CM | POA: Diagnosis not present

## 2020-05-21 ENCOUNTER — Ambulatory Visit (INDEPENDENT_AMBULATORY_CARE_PROVIDER_SITE_OTHER): Payer: BC Managed Care – PPO | Admitting: *Deleted

## 2020-05-21 DIAGNOSIS — Z23 Encounter for immunization: Secondary | ICD-10-CM

## 2020-06-16 DIAGNOSIS — R102 Pelvic and perineal pain: Secondary | ICD-10-CM | POA: Diagnosis not present

## 2020-06-16 DIAGNOSIS — R350 Frequency of micturition: Secondary | ICD-10-CM | POA: Diagnosis not present

## 2020-06-16 DIAGNOSIS — N3010 Interstitial cystitis (chronic) without hematuria: Secondary | ICD-10-CM | POA: Diagnosis not present

## 2020-06-16 DIAGNOSIS — N9419 Other specified dyspareunia: Secondary | ICD-10-CM | POA: Diagnosis not present

## 2020-06-16 DIAGNOSIS — M6289 Other specified disorders of muscle: Secondary | ICD-10-CM | POA: Diagnosis not present

## 2020-06-27 DIAGNOSIS — Z1231 Encounter for screening mammogram for malignant neoplasm of breast: Secondary | ICD-10-CM | POA: Diagnosis not present

## 2020-07-11 ENCOUNTER — Encounter: Payer: Self-pay | Admitting: Family Medicine

## 2020-07-11 ENCOUNTER — Ambulatory Visit (INDEPENDENT_AMBULATORY_CARE_PROVIDER_SITE_OTHER): Payer: BC Managed Care – PPO | Admitting: Family Medicine

## 2020-07-11 ENCOUNTER — Telehealth: Payer: Self-pay

## 2020-07-11 ENCOUNTER — Other Ambulatory Visit: Payer: Self-pay

## 2020-07-11 VITALS — BP 100/60 | HR 73 | Temp 98.2°F | Resp 14 | Wt 140.4 lb

## 2020-07-11 DIAGNOSIS — Z1159 Encounter for screening for other viral diseases: Secondary | ICD-10-CM

## 2020-07-11 DIAGNOSIS — Z0000 Encounter for general adult medical examination without abnormal findings: Secondary | ICD-10-CM | POA: Diagnosis not present

## 2020-07-11 DIAGNOSIS — R1013 Epigastric pain: Secondary | ICD-10-CM

## 2020-07-11 MED ORDER — ALBUTEROL SULFATE HFA 108 (90 BASE) MCG/ACT IN AERS: 2.0000 | INHALATION_SPRAY | Freq: Four times a day (QID) | RESPIRATORY_TRACT | 3 refills | Status: DC | PRN

## 2020-07-11 MED ORDER — CLOBETASOL PROPIONATE 0.05 % EX OINT
TOPICAL_OINTMENT | Freq: Every day | CUTANEOUS | 5 refills | Status: DC | PRN
Start: 2020-07-11 — End: 2021-07-24

## 2020-07-11 NOTE — Progress Notes (Signed)
Patient: Sierra Hodge MRN: [id number] DOB: 12/22/1975 PCP: Orma Flaming, MD     Subjective:  Chief Complaint  Patient presents with  . Medication Management    would like to have asthma medication  . Annual Exam    HPI: The patient is a 44 y.o. female who presents today for annual exam. She denies any changes to past medical history. There have been no recent hospitalizations. They are following a well balanced diet and exercise plan. Weight has been stable. No complaints today.   No family hx of breast or colon cancer in first degree relative.   Needs refill of her albuterol inhaler.   She has had chronic abdominal pain with work up with scan/GI/labs. All negative. She has had a cholecystectomy. She tells me today she thinks it may be more diet related. If she has beer, pork or dairy it seems to set her off. She has not kept a food journal.   Has had her flu shot.   Immunization History  Administered Date(s) Administered  . Influenza,inj,Quad PF,6+ Mos 06/22/2016, 06/24/2017, 06/26/2018, 06/18/2019, 05/21/2020  . MMR 04/29/1975, 02/08/2001  . OPV 03/03/1988  . Td 12/06/1979, 08/28/2001  . Tdap 02/27/2014   Colonoscopy: due this year.  Mammogram: 06/27/2020 Pap smear: 10/20/2018   Review of Systems  Constitutional: Negative for chills, fatigue and fever.  HENT: Negative for dental problem, ear pain, hearing loss and trouble swallowing.   Eyes: Negative for visual disturbance.  Respiratory: Negative for cough, chest tightness and shortness of breath.   Cardiovascular: Negative for chest pain, palpitations and leg swelling.  Gastrointestinal: Negative for abdominal pain, blood in stool, diarrhea and nausea.  Endocrine: Negative for cold intolerance, polydipsia, polyphagia and polyuria.  Genitourinary: Negative for dysuria and hematuria.  Musculoskeletal: Negative for arthralgias.  Skin: Negative for rash.  Neurological: Negative for dizziness and headaches.   Psychiatric/Behavioral: Negative for dysphoric mood and sleep disturbance. The patient is not nervous/anxious.     Allergies Patient is allergic to morphine and related and mobic [meloxicam].  Past Medical History Patient  has a past medical history of Chronic interstitial cystitis, Complication of anesthesia, Endometriosis, Fatty liver, Headache, Hyperlipidemia, IUD (intrauterine device) in place, and Other psoriasis.  Surgical History Patient  has a past surgical history that includes Cesarean section; Tubal ligation (2010); Tonsillectomy; laparoscopy (03/29/2012); cysto with hydrodistension (03/29/2012); Umbilical hernia repair (10/2010); Cholecystectomy (N/A, 07/07/2017); and Esophagogastroduodenoscopy (2018).  Family History Pateint's family history includes Allergic rhinitis in her mother; Alzheimer's disease in her father; Diabetes in her paternal uncle; Heart disease in an other family member; Hyperlipidemia in her mother and sister; Migraines in her mother.  Social History Patient  reports that she has never smoked. She has never used smokeless tobacco. She reports current alcohol use. She reports that she does not use drugs.    Objective: Vitals:   07/11/20 0831  BP: 100/60  Pulse: 73  Resp: 14  Temp: 98.2 F (36.8 C)  SpO2: 99%  Weight: 140 lb 6.4 oz (63.7 kg)    Body mass index is 28.36 kg/m.  Physical Exam Vitals reviewed.  Constitutional:      Appearance: Normal appearance. She is well-developed. She is obese.  HENT:     Head: Normocephalic and atraumatic.     Right Ear: Tympanic membrane, ear canal and external ear normal.     Left Ear: Tympanic membrane, ear canal and external ear normal.     Mouth/Throat:     Mouth: Mucous membranes are moist.  Eyes:     Extraocular Movements: Extraocular movements intact.     Conjunctiva/sclera: Conjunctivae normal.     Pupils: Pupils are equal, round, and reactive to light.  Neck:     Thyroid: No thyromegaly.   Cardiovascular:     Rate and Rhythm: Normal rate and regular rhythm.     Pulses: Normal pulses.     Heart sounds: Normal heart sounds. No murmur heard.   Pulmonary:     Effort: Pulmonary effort is normal.     Breath sounds: Normal breath sounds.  Abdominal:     General: Abdomen is flat. Bowel sounds are normal. There is no distension.     Palpations: Abdomen is soft.     Tenderness: There is no abdominal tenderness.  Musculoskeletal:     Cervical back: Normal range of motion and neck supple.  Lymphadenopathy:     Cervical: No cervical adenopathy.  Skin:    General: Skin is warm and dry.     Capillary Refill: Capillary refill takes less than 2 seconds.     Findings: No rash.  Neurological:     General: No focal deficit present.     Mental Status: She is alert and oriented to person, place, and time.     Cranial Nerves: No cranial nerve deficit.     Coordination: Coordination normal.     Deep Tendon Reflexes: Reflexes normal.  Psychiatric:        Mood and Affect: Mood normal.        Behavior: Behavior normal.        Assessment/plan: 1. Annual physical exam Routine fasting labs today. HM reviewed. UTD> continue healthy diet and exercise. Great job on weight loss. Hx of fatty liver on ultrasound so this is wonderful. F/u in one year or as needed.  Patient counseling [x]    Nutrition: Stressed importance of moderation in sodium/caffeine intake, saturated fat and cholesterol, caloric balance, sufficient intake of fresh fruits, vegetables, fiber, calcium, iron, and 1 mg of folate supplement per day (for females capable of pregnancy).  [x]    Stressed the importance of regular exercise.   []    Substance Abuse: Discussed cessation/primary prevention of tobacco, alcohol, or other drug use; driving or other dangerous activities under the influence; availability of treatment for abuse.   [x]    Injury prevention: Discussed safety belts, safety helmets, smoke detector, smoking near bedding  or upholstery.   [x]    Sexuality: Discussed sexually transmitted diseases, partner selection, use of condoms, avoidance of unintended pregnancy  and contraceptive alternatives.  [x]    Dental health: Discussed importance of regular tooth brushing, flossing, and dental visits.  [x]    Health maintenance and immunizations reviewed. Please refer to Health maintenance section.    - CBC with Differential/Platelet; Future - Lipid panel; Future - TSH; Future - COMPLETE METABOLIC PANEL WITH GFR; Future  2. Encounter for hepatitis C screening test for low risk patient  - Hepatitis C antibody  3. Food intolerance/stomach pain -checking for celiacs -keep food journal. Very important to help [place] identify triggers -discussed colonoscopy at age 17, she declines today.    This visit occurred during the SARS-CoV-2 public health emergency.  Safety protocols were in place, including screening questions prior to the visit, additional usage of staff PPE, and extensive cleaning of exam room while observing appropriate contact time as indicated for disinfecting solutions.     Return in about 1 year (around 07/11/2021) for annual .     Orma Flaming, MD [facility]  07/11/2020

## 2020-07-11 NOTE — Patient Instructions (Addendum)
-UTD on all of your health maintenance -labs today -keep a food journal for me and you may figure out what is your trigger.  -recommended colonoscopy at 43 years. I know you are not ready, but I do recommend this so email me if you feel like you have time to do this coming up.   Preventive Care 27-46 Years Old, Female Preventive care refers to visits with your health care provider and lifestyle choices that can promote health and wellness. This includes:  A yearly physical exam. This may also be called an annual well check.  Regular dental visits and eye exams.  Immunizations.  Screening for certain conditions.  Healthy lifestyle choices, such as eating a healthy diet, getting regular exercise, not using drugs or products that contain nicotine and tobacco, and limiting alcohol use. What can I expect for my preventive care visit? Physical exam Your health care provider will check your:  Height and weight. This may be used to calculate body mass index (BMI), which tells if you are at a healthy weight.  Heart rate and blood pressure.  Skin for abnormal spots. Counseling Your health care provider may ask you questions about your:  Alcohol, tobacco, and drug use.  Emotional well-being.  Home and relationship well-being.  Sexual activity.  Eating habits.  Work and work Statistician.  Method of birth control.  Menstrual cycle.  Pregnancy history. What immunizations do I need?  Influenza (flu) vaccine  This is recommended every year. Tetanus, diphtheria, and pertussis (Tdap) vaccine  You may need a Td booster every 10 years. Varicella (chickenpox) vaccine  You may need this if you have not been vaccinated. Zoster (shingles) vaccine  You may need this after age 23. Measles, mumps, and rubella (MMR) vaccine  You may need at least one dose of MMR if you were born in 1957 or later. You may also need a second dose. Pneumococcal conjugate (PCV13) vaccine  You may  need this if you have certain conditions and were not previously vaccinated. Pneumococcal polysaccharide (PPSV23) vaccine  You may need one or two doses if you smoke cigarettes or if you have certain conditions. Meningococcal conjugate (MenACWY) vaccine  You may need this if you have certain conditions. Hepatitis A vaccine  You may need this if you have certain conditions or if you travel or work in places where you may be exposed to hepatitis A. Hepatitis B vaccine  You may need this if you have certain conditions or if you travel or work in places where you may be exposed to hepatitis B. Haemophilus influenzae type b (Hib) vaccine  You may need this if you have certain conditions. Human papillomavirus (HPV) vaccine  If recommended by your health care provider, you may need three doses over 6 months. You may receive vaccines as individual doses or as more than one vaccine together in one shot (combination vaccines). Talk with your health care provider about the risks and benefits of combination vaccines. What tests do I need? Blood tests  Lipid and cholesterol levels. These may be checked every 5 years, or more frequently if you are over 66 years old.  Hepatitis C test.  Hepatitis B test. Screening  Lung cancer screening. You may have this screening every year starting at age 40 if you have a 30-pack-year history of smoking and currently smoke or have quit within the past 15 years.  Colorectal cancer screening. All adults should have this screening starting at age 9 and continuing until age 46.  Your health care provider may recommend screening at age 4 if you are at increased risk. You will have tests every 1-10 years, depending on your results and the type of screening test.  Diabetes screening. This is done by checking your blood sugar (glucose) after you have not eaten for a while (fasting). You may have this done every 1-3 years.  Mammogram. This may be done every 1-2  years. Talk with your health care provider about when you should start having regular mammograms. This may depend on whether you have a family history of breast cancer.  BRCA-related cancer screening. This may be done if you have a family history of breast, ovarian, tubal, or peritoneal cancers.  Pelvic exam and Pap test. This may be done every 3 years starting at age 29. Starting at age 53, this may be done every 5 years if you have a Pap test in combination with an HPV test. Other tests  Sexually transmitted disease (STD) testing.  Bone density scan. This is done to screen for osteoporosis. You may have this scan if you are at high risk for osteoporosis. Follow these instructions at home: Eating and drinking  Eat a diet that includes fresh fruits and vegetables, whole grains, lean protein, and low-fat dairy.  Take vitamin and mineral supplements as recommended by your health care provider.  Do not drink alcohol if: ? Your health care provider tells you not to drink. ? You are pregnant, may be pregnant, or are planning to become pregnant.  If you drink alcohol: ? Limit how much you have to 0-1 drink a day. ? Be aware of how much alcohol is in your drink. In the [place] one drink equals one 12 oz bottle of beer (355 mL), one 5 oz glass of wine (148 mL), or one 1 oz glass of hard liquor (44 mL). Lifestyle  Take daily care of your teeth and gums.  Stay active. Exercise for at least 30 minutes on 5 or more days each week.  Do not use any products that contain nicotine or tobacco, such as cigarettes, e-cigarettes, and chewing tobacco. If you need help quitting, ask your health care provider.  If you are sexually active, practice safe sex. Use a condom or other form of birth control (contraception) in order to prevent pregnancy and STIs (sexually transmitted infections).  If told by your health care provider, take low-dose aspirin daily starting at age 23. What's next?  Visit your  health care provider once a year for a well check visit.  Ask your health care provider how often you should have your eyes and teeth checked.  Stay up to date on all vaccines. This information is not intended to replace advice given to you by your health care provider. Make sure you discuss any questions you have with your health care provider. Document Revised: 05/18/2018 Document Reviewed: 05/18/2018 Elsevier Patient Education  2020 [employer].

## 2020-07-11 NOTE — Telephone Encounter (Signed)
Pt would like lab results mailed to her from today's visit. Please advise.

## 2020-07-15 LAB — CBC WITH DIFFERENTIAL/PLATELET
Absolute Monocytes: 409 cells/uL (ref 200–950)
Basophils Absolute: 31 cells/uL (ref 0–200)
Basophils Relative: 0.5 %
Eosinophils Absolute: 98 cells/uL (ref 15–500)
Eosinophils Relative: 1.6 %
HCT: 42.8 % (ref 35.0–45.0)
Hemoglobin: 14.3 g/dL (ref 11.7–15.5)
Lymphs Abs: 1806 cells/uL (ref 850–3900)
MCH: 29.4 pg (ref 27.0–33.0)
MCHC: 33.4 g/dL (ref 32.0–36.0)
MCV: 87.9 fL (ref 80.0–100.0)
MPV: 11.1 fL (ref 7.5–12.5)
Monocytes Relative: 6.7 %
Neutro Abs: 3758 cells/uL (ref 1500–7800)
Neutrophils Relative %: 61.6 %
Platelets: 263 10*3/uL (ref 140–400)
RBC: 4.87 10*6/uL (ref 3.80–5.10)
RDW: 12.8 % (ref 11.0–15.0)
Total Lymphocyte: 29.6 %
WBC: 6.1 10*3/uL (ref 3.8–10.8)

## 2020-07-15 LAB — LIPID PANEL
Cholesterol: 200 mg/dL — ABNORMAL HIGH (ref <200)
HDL: 46 mg/dL — ABNORMAL LOW (ref >=50)
LDL Cholesterol (Calc): 136 mg/dL (calc) — ABNORMAL HIGH
Non-HDL Cholesterol (Calc): 154 mg/dL (calc) — ABNORMAL HIGH (ref <130)
Total CHOL/HDL Ratio: 4.3 (calc) (ref <5.0)
Triglycerides: 82 mg/dL (ref <150)

## 2020-07-15 LAB — GLIADIN ANTIBODIES, SERUM
Gliadin IgA: 1 U/mL
Gliadin IgG: <1 U/mL

## 2020-07-15 LAB — COMPLETE METABOLIC PANEL WITH GFR
AG Ratio: 1.8 (calc) (ref 1.0–2.5)
ALT: 18 U/L (ref 6–29)
AST: 14 U/L (ref 10–35)
Albumin: 4.3 g/dL (ref 3.6–5.1)
Alkaline phosphatase (APISO): 54 U/L (ref 31–125)
BUN: 10 mg/dL (ref 7–25)
CO2: 27 mmol/L (ref 20–32)
Calcium: 9.6 mg/dL (ref 8.6–10.2)
Chloride: 103 mmol/L (ref 98–110)
Creat: 0.6 mg/dL (ref 0.50–1.10)
GFR, Est African American: 128 mL/min/{1.73_m2} (ref >=60)
GFR, Est Non African American: 110 mL/min/{1.73_m2} (ref >=60)
Globulin: 2.4 g/dL (calc) (ref 1.9–3.7)
Glucose, Bld: 99 mg/dL (ref 65–99)
Potassium: 4.1 mmol/L (ref 3.5–5.3)
Sodium: 138 mmol/L (ref 135–146)
Total Bilirubin: 0.8 mg/dL (ref 0.2–1.2)
Total Protein: 6.7 g/dL (ref 6.1–8.1)

## 2020-07-15 LAB — LIPASE: Lipase: 40 U/L (ref 7–60)

## 2020-07-15 LAB — TISSUE TRANSGLUTAMINASE, IGA: (tTG) Ab, IgA: <1 U/mL

## 2020-07-15 LAB — HEPATITIS C ANTIBODY
Hepatitis C Ab: NONREACTIVE
SIGNAL TO CUT-OFF: 0.01 (ref <1.00)

## 2020-07-15 LAB — TSH: TSH: 1.58 mIU/L

## 2020-07-15 LAB — RETICULIN ANTIBODIES, IGA W TITER: Reticulin IgA Screen: NEGATIVE

## 2020-08-07 DIAGNOSIS — N941 Unspecified dyspareunia: Secondary | ICD-10-CM | POA: Diagnosis not present

## 2020-08-07 DIAGNOSIS — R102 Pelvic and perineal pain: Secondary | ICD-10-CM | POA: Diagnosis not present

## 2020-08-07 DIAGNOSIS — N3010 Interstitial cystitis (chronic) without hematuria: Secondary | ICD-10-CM | POA: Diagnosis not present

## 2020-08-07 DIAGNOSIS — N809 Endometriosis, unspecified: Secondary | ICD-10-CM | POA: Diagnosis not present

## 2020-11-10 ENCOUNTER — Telehealth: Payer: Self-pay

## 2020-11-10 ENCOUNTER — Ambulatory Visit (INDEPENDENT_AMBULATORY_CARE_PROVIDER_SITE_OTHER): Payer: BC Managed Care – PPO

## 2020-11-10 ENCOUNTER — Other Ambulatory Visit: Payer: Self-pay

## 2020-11-10 ENCOUNTER — Encounter: Payer: Self-pay | Admitting: Family Medicine

## 2020-11-10 ENCOUNTER — Ambulatory Visit: Payer: BC Managed Care – PPO | Admitting: Family Medicine

## 2020-11-10 VITALS — BP 98/68 | HR 97 | Temp 98.5°F | Wt 141.8 lb

## 2020-11-10 DIAGNOSIS — R109 Unspecified abdominal pain: Secondary | ICD-10-CM | POA: Diagnosis not present

## 2020-11-10 DIAGNOSIS — N3010 Interstitial cystitis (chronic) without hematuria: Secondary | ICD-10-CM

## 2020-11-10 DIAGNOSIS — R1031 Right lower quadrant pain: Secondary | ICD-10-CM | POA: Diagnosis not present

## 2020-11-10 DIAGNOSIS — R1011 Right upper quadrant pain: Secondary | ICD-10-CM

## 2020-11-10 DIAGNOSIS — K760 Fatty (change of) liver, not elsewhere classified: Secondary | ICD-10-CM

## 2020-11-10 DIAGNOSIS — M545 Low back pain, unspecified: Secondary | ICD-10-CM

## 2020-11-10 LAB — COMPREHENSIVE METABOLIC PANEL
ALT: 23 U/L (ref 0–35)
AST: 19 U/L (ref 0–37)
Albumin: 4.3 g/dL (ref 3.5–5.2)
Alkaline Phosphatase: 46 U/L (ref 39–117)
BUN: 12 mg/dL (ref 6–23)
CO2: 26 mEq/L (ref 19–32)
Calcium: 9.7 mg/dL (ref 8.4–10.5)
Chloride: 103 mEq/L (ref 96–112)
Creatinine, Ser: 0.69 mg/dL (ref 0.40–1.20)
GFR: 104.25 mL/min (ref >60.00)
Glucose, Bld: 140 mg/dL — ABNORMAL HIGH (ref 70–99)
Potassium: 3.6 mEq/L (ref 3.5–5.1)
Sodium: 136 mEq/L (ref 135–145)
Total Bilirubin: 0.9 mg/dL (ref 0.2–1.2)
Total Protein: 7.1 g/dL (ref 6.0–8.3)

## 2020-11-10 LAB — POCT URINALYSIS DIPSTICK
Bilirubin, UA: NEGATIVE
Glucose, UA: NEGATIVE
Ketones, UA: NEGATIVE
Leukocytes, UA: NEGATIVE
Nitrite, UA: NEGATIVE
Protein, UA: NEGATIVE
Spec Grav, UA: 1.015 (ref 1.010–1.025)
Urobilinogen, UA: 0.2 E.U./dL
pH, UA: 6.5 (ref 5.0–8.0)

## 2020-11-10 LAB — CBC WITH DIFFERENTIAL/PLATELET
Basophils Absolute: 0 10*3/uL (ref 0.0–0.1)
Basophils Relative: 0.4 % (ref 0.0–3.0)
Eosinophils Absolute: 0.1 10*3/uL (ref 0.0–0.7)
Eosinophils Relative: 0.9 % (ref 0.0–5.0)
HCT: 39.9 % (ref 36.0–46.0)
Hemoglobin: 13.6 g/dL (ref 12.0–15.0)
Lymphocytes Relative: 26 % (ref 12.0–46.0)
Lymphs Abs: 2.2 10*3/uL (ref 0.7–4.0)
MCHC: 34.2 g/dL (ref 30.0–36.0)
MCV: 86 fl (ref 78.0–100.0)
Monocytes Absolute: 0.5 10*3/uL (ref 0.1–1.0)
Monocytes Relative: 6.1 % (ref 3.0–12.0)
Neutro Abs: 5.6 10*3/uL (ref 1.4–7.7)
Neutrophils Relative %: 66.6 % (ref 43.0–77.0)
Platelets: 258 10*3/uL (ref 150.0–400.0)
RBC: 4.64 Mil/uL (ref 3.87–5.11)
RDW: 13.6 % (ref 11.5–15.5)
WBC: 8.4 10*3/uL (ref 4.0–10.5)

## 2020-11-10 LAB — LIPASE: Lipase: 42 U/L (ref 11.0–59.0)

## 2020-11-10 LAB — SEDIMENTATION RATE: Sed Rate: 15 mm/hr (ref 0–20)

## 2020-11-10 MED ORDER — HYOSCYAMINE SULFATE SL 0.125 MG SL SUBL
0.1250 mg | SUBLINGUAL_TABLET | Freq: Three times a day (TID) | SUBLINGUAL | 2 refills | Status: DC
Start: 2020-11-10 — End: 2020-12-23

## 2020-11-10 NOTE — Telephone Encounter (Signed)
Nurse Assessment Nurse: Yetta Barre, RN, Miranda Date/Time (Eastern Time): 11/10/2020 10:53:02 AM Confirm and document reason for call. If symptomatic, describe symptoms. ---Caller states she is having abdominal pain off and on for 2 years. The pain has been worse since yesterday. The pain is in her right upper abdomen. Does the patient have any new or worsening symptoms? ---Yes Will a triage be completed? ---Yes Related visit to physician within the last 2 weeks? ---No Does the PT have any chronic conditions? (i.e. diabetes, asthma, this includes High risk factors for pregnancy, etc.) ---Yes List chronic conditions. ---Allergies, Asthma Is the patient pregnant or possibly pregnant? (Ask all females between the ages of 29-55) ---No Is this a behavioral health or substance abuse call? ---No Guidelines Guideline Title Affirmed Question Affirmed Notes Nurse Date/Time (Eastern Time) Abdominal Pain - Upper [1] MILD-MODERATE pain AND [2] constant AND [3] present > 2 hours Yetta Barre, RN, Miranda 11/10/2020 10:55:38 AM Disp. Time Lamount Cohen Time) Disposition Final User 11/10/2020 11:00:17 AM See HCP within 4 Hours (or PCP triage) Yes Yetta Barre, RN, Miranda PLEASE NOTE: All timestamps contained within this report are represented as [place] Standard Time. CONFIDENTIALTY NOTICE: This fax transmission is intended only for the addressee. It contains information that is legally privileged, confidential or otherwise protected from use or disclosure. If you are not the intended recipient, you are strictly prohibited from reviewing, disclosing, copying using or disseminating any of this information or taking any action in reliance on or regarding this information. If you have received this fax in error, please notify us immediately by telephone so that we can arrange for its return to [place]. Phone: [phone], Toll-Free: [phone], Fax: [phone] Page: 2 of 2 Call Id: [id number] Caller Disagree/Comply  Comply Caller Understands Yes PreDisposition Call Doctor Care Advice Given Per Guideline SEE HCP (OR PCP TRIAGE) WITHIN 4 HOURS: * IF OFFICE WILL BE OPEN: You need to be seen within the next 3 or 4 hours. Call your doctor (or NP/PA) now or as soon as the office opens. CALL BACK IF: * You become worse CARE ADVICE given per Abdominal Pain, Upper (Adult) guideline. Referrals REFERRED TO PCP OFFIC

## 2020-11-10 NOTE — Progress Notes (Signed)
Please call patient: I have reviewed his/her lab results. Labwork is all ok. No sign of serious infection. Await xray and trial of meds; ref f/u with PCP as recommended.

## 2020-11-10 NOTE — Progress Notes (Signed)
Subjective  CC:  Chief Complaint  Patient presents with  . Abdominal Pain    Right side of abdomen, nauseous, gas, lower back pain. Pain started 2 years ago - CT scan was performed - results normal    HPI: Sierra Hodge is a 45 y.o. female who presents to the office today to address the problems listed above in the chief complaint.  Pleasant 45 year old female who has long history of abdominal pain.  I reviewed chart including old records from gastroenterology, PCP, imaging studies including CT scan.  She presents with 2-week history of recurrent right upper abdominal pain.  She was doing well for several months however pain has resurfaced.  Is very similar in nature to the pain she had before.  She describes aching pain that is associated with spasm and bloating.  She also has low back pain middle without radiation and right lower groin pain.  She has not noticed any inguinal lumps or masses.  She has had no fevers, chills, nausea or vomiting.  She did have beer and wine over the last week or 2 and thinks this could be contributing to her symptoms.  In the past she has had normal blood work, negative CT of the abdomen and pelvis, negative EGD.  She does have nonalcoholic fatty liver disease.  She denies jaundice.  She denies dyspepsia.  She is used Pepto-Bismol yesterday which helped a little bit.  She also felt constipated this morning but felt better after moving her bowels.  She has never been diagnosed with irritable bowel syndrome but her daughter has.  Is been no blood in the stool.  She tried Tylenol and Advil.  Advil did help her low back pain.  She does have interstitial cystitis but does not feel like that is currently active.  Endometriosis is under control and she has a Mirena IUD.   Assessment  1. RUQ pain   2. RLQ abdominal pain   3. Interstitial cystitis   4. NAFLD (nonalcoholic fatty liver disease)   5. Acute bilateral low back pain without sciatica      Plan    Recurrent abdominal pain: Benign abdomen.  Unclear source.  Question IBS.  Will evaluate with blood work and urine testing.  X-ray.  Pending lab results, further imaging studies if indicated.  I recommend Tylenol, Advil and trial of Levsin.  Recommend follow-up with her PCP next week.  If fevers develop or worsening right lower quadrant pain, will need further imaging for appendicitis.  Her exam was benign today.  Follow up: 3 to 10 days with PCP for follow-up Visit date not found  Orders Placed This Encounter  Procedures  . DG Abd 1 View  . CBC with Differential/Platelet  . Comprehensive metabolic panel  . Lipase  . Sedimentation rate  . POCT urinalysis dipstick   Meds ordered this encounter  Medications  . Hyoscyamine Sulfate SL (LEVSIN/SL) 0.125 MG SUBL    Sig: Place 0.125 mg under the tongue 3 (three) times daily before meals. As needed for bloating and cramping    Dispense:  30 tablet    Refill:  2      I reviewed the patients updated PMH, FH, and SocHx.    Patient Active Problem List   Diagnosis Date Noted  . NAFLD (nonalcoholic fatty liver disease) 88/50/2774  . Dyspepsia 08/11/2019  . RUQ pain 08/11/2019  . IUD (intrauterine device) in place 07/10/2018  . Recurrent maxillary sinusitis 11/08/2017  . Mild intermittent asthma/exercise-induced  bronchospasm 11/08/2017  . Migraine without aura and without status migrainosus, not intractable 09/05/2017  . Psoriasis 01/16/2014  . Perennial allergic rhinitis 12/07/2013  . Endometriosis 04/11/2012  . Interstitial cystitis 04/11/2012   Current Meds  Medication Sig  . albuterol (VENTOLIN HFA) 108 (90 Base) MCG/ACT inhaler Inhale 2 puffs into the lungs every 6 (six) hours as needed for wheezing or shortness of breath.  . clobetasol ointment (TEMOVATE) 0.05 % Apply topically daily as needed.  . hydrOXYzine (ATARAX/VISTARIL) 10 MG tablet hydroxyzine HCl 10 mg tablet  TAKE 1 TABLET BY MOUTH EVERYDAY AT BEDTIME  . Hyoscyamine  Sulfate SL (LEVSIN/SL) 0.125 MG SUBL Place 0.125 mg under the tongue 3 (three) times daily before meals. As needed for bloating and cramping  . ibuprofen (ADVIL,MOTRIN) 200 MG tablet Take 400-600 mg by mouth as needed.  Marland Kitchen levonorgestrel (MIRENA) 20 MCG/24HR IUD 1 each by Intrauterine route once.  . montelukast (SINGULAIR) 10 MG tablet montelukast 10 mg tablet  . Omega-3 1000 MG CAPS Take by mouth.  . Pumpkin Seed-Soy Germ (AZO BLADDER CONTROL/GO-LESS PO) Azo Bladder Control  . triamcinolone lotion (KENALOG) 0.1 % triamcinolone acetonide 0.1 % lotion  . VITAMIN D PO Take by mouth daily.  . Vitamin E 100 units TABS Take by mouth.    Allergies: Patient is allergic to morphine and related and mobic [meloxicam]. Family History: Patient family history includes Allergic rhinitis in her mother; Alzheimer's disease in her father; Diabetes in her paternal uncle; Heart disease in an other family member; Hyperlipidemia in her mother and sister; Migraines in her mother. Social History:  Patient  reports that she has never smoked. She has never used smokeless tobacco. She reports current alcohol use. She reports that she does not use drugs.  Review of Systems: Constitutional: Negative for fever malaise or anorexia Cardiovascular: negative for chest pain Respiratory: negative for SOB or persistent cough Gastrointestinal: negative for abdominal pain  Objective  Vitals: BP 98/68   Pulse 97   Temp 98.5 F (36.9 C) (Temporal)   Wt 141 lb 12.8 oz (64.3 kg)   SpO2 98%   BMI 28.64 kg/m  General: no acute distress , A&Ox3, nontoxic appearing HEENT: PEERL, conjunctiva normal, neck is supple Cardiovascular:  RRR without murmur or gallop.  Respiratory:  Good breath sounds bilaterally, CTAB with normal respiratory effort Gastrointestinal: soft, flat abdomen, normal active bowel sounds, right lateral upper quadrant tenderness without rebound or guarding, no palpable masses, no hepatosplenomegaly, no  appreciated hernias no right lower quadrant tenderness Skin:  Warm, no rashes     Commons side effects, risks, benefits, and alternatives for medications and treatment plan prescribed today were discussed, and the patient expressed understanding of the given instructions. Patient is instructed to call or message via MyChart if he/she has any questions or concerns regarding our treatment plan. No barriers to understanding were identified. We discussed Red Flag symptoms and signs in detail. Patient expressed understanding regarding what to do in case of urgent or emergency type symptoms.   Medication list was reconciled, printed and provided to the patient in AVS. Patient instructions and summary information was reviewed with the patient as documented in the AVS. This note was prepared with assistance of Dragon voice recognition software. Occasional wrong-word or sound-a-like substitutions may have occurred due to the inherent limitations of voice recognition software  This visit occurred during the SARS-CoV-2 public health emergency.  Safety protocols were in place, including screening questions prior to the visit, additional usage of staff PPE,  and extensive cleaning of exam room while observing appropriate contact time as indicated for disinfecting solutions.

## 2020-11-10 NOTE — Telephone Encounter (Signed)
Patient seen Dr.Andy, and is scheduled for a follow up with Dr.Wolfe next week.

## 2020-11-10 NOTE — Patient Instructions (Signed)
Please return in 3-10 days to see Dr. Artis Flock for recheck.   Start tylenol twice a day for back pain. You may use alleve or advil intermittently as well.  Try the levsin to see if it helps your abdominal symptoms including bloating and cramping.   We will call you with your blood work and xray results.   If you have any questions or concerns, please don't hesitate to send me a message via MyChart or call the office at [phone]. Thank you for visiting with [place] today! It's our pleasure caring for you.

## 2020-11-10 NOTE — Telephone Encounter (Signed)
Does not need seen in 4 hours. Have imaged her and done big work up. Can see her this week or next.  Orland Mustard, MD [facility]

## 2020-11-21 ENCOUNTER — Other Ambulatory Visit: Payer: Self-pay

## 2020-11-21 ENCOUNTER — Ambulatory Visit: Payer: BC Managed Care – PPO | Admitting: Family Medicine

## 2020-11-21 ENCOUNTER — Ambulatory Visit (INDEPENDENT_AMBULATORY_CARE_PROVIDER_SITE_OTHER): Payer: BC Managed Care – PPO

## 2020-11-21 ENCOUNTER — Encounter: Payer: Self-pay | Admitting: Family Medicine

## 2020-11-21 VITALS — BP 110/74 | HR 79 | Temp 98.2°F | Ht 59.0 in | Wt 140.8 lb

## 2020-11-21 DIAGNOSIS — R1084 Generalized abdominal pain: Secondary | ICD-10-CM

## 2020-11-21 DIAGNOSIS — K639 Disease of intestine, unspecified: Secondary | ICD-10-CM | POA: Diagnosis not present

## 2020-11-21 DIAGNOSIS — Z9049 Acquired absence of other specified parts of digestive tract: Secondary | ICD-10-CM | POA: Diagnosis not present

## 2020-11-21 DIAGNOSIS — Z8719 Personal history of other diseases of the digestive system: Secondary | ICD-10-CM | POA: Diagnosis not present

## 2020-11-21 DIAGNOSIS — R140 Abdominal distension (gaseous): Secondary | ICD-10-CM | POA: Diagnosis not present

## 2020-11-21 NOTE — Progress Notes (Signed)
Patient: Sierra Hodge MRN: [id number] DOB: [birth year] PCP: Orland Mustard, MD     Subjective:  Chief Complaint  Patient presents with  . Abdominal Pain    Follow up from Dr. Mardelle Matte. Pt says that abd pain has subsided.    HPI: The patient is a 45 y.o. female who presents today for follow up visit for abdominal pain. Acute on chronic issue. She typically has pain her RUQ and lateral side. She has been imaged and negative. She has seen GI. Ct abdo in 02/2019 was normal.  RUQ pain is better. She still has some pain in her RLQ. Her cecum was distended to 10cm on previous KUB. Pain last week was bad, 10/10 and this week its' down to 3/10. Described as dull in nature.  Pain comes and goes and is worse with constipation. She has a BM daily, so not sure if truly constipated. She has no diarrhea or blood in stool. No fever/chills. She has no nausea or vomiting, had some nausea last week. Not worse with any movement. Does yoga with no issues.  Still has her appendix. She was given hyoscyamine at her last visit. Maybe helped a little bit. She has never had a colonoscopy.   She is followed by Dr. Leone Payor.   Review of Systems  Constitutional: Negative for chills, diaphoresis and fever.  Respiratory: Negative for cough and shortness of breath.   Cardiovascular: Negative for chest pain, palpitations and leg swelling.  Gastrointestinal: Positive for abdominal pain. Negative for abdominal distention, blood in stool, constipation, diarrhea, nausea and vomiting.    Allergies Patient is allergic to morphine and related and mobic [meloxicam].  Past Medical History Patient  has a past medical history of Chronic interstitial cystitis, Complication of anesthesia, Endometriosis, Fatty liver, Headache, Hyperlipidemia, IUD (intrauterine device) in place, and Other psoriasis.  Surgical History Patient  has a past surgical history that includes Cesarean section; Tubal ligation (2010); Tonsillectomy; laparoscopy  (03/29/2012); cysto with hydrodistension (03/29/2012); Umbilical hernia repair (10/2010); Cholecystectomy (N/A, 07/07/2017); and Esophagogastroduodenoscopy (2018).  Family History Pateint's family history includes Allergic rhinitis in her mother; Alzheimer's disease in her father; Diabetes in her paternal uncle; Heart disease in an other family member; Hyperlipidemia in her mother and sister; Migraines in her mother.  Social History Patient  reports that she has never smoked. She has never used smokeless tobacco. She reports current alcohol use. She reports that she does not use drugs.    Objective: Vitals:   11/21/20 0910  BP: 110/74  Pulse: 79  Temp: 98.2 F (36.8 C)  TempSrc: Temporal  SpO2: 99%  Weight: 140 lb 12.8 oz (63.9 kg)  Height: 4\' 11"  (1.499 m)    Body mass index is 28.44 kg/m.  Physical Exam Vitals reviewed.  Constitutional:      General: She is not in acute distress.    Appearance: She is well-developed and normal weight. She is not ill-appearing.  HENT:     Head: Normocephalic and atraumatic.  Cardiovascular:     Rate and Rhythm: Normal rate and regular rhythm.     Heart sounds: Normal heart sounds.  Pulmonary:     Effort: Pulmonary effort is normal.     Breath sounds: Normal breath sounds.  Abdominal:     General: Abdomen is flat. Bowel sounds are increased.     Palpations: Abdomen is soft.     Tenderness: There is abdominal tenderness in the right upper quadrant, right lower quadrant and periumbilical area. There is no guarding or  rebound. Negative signs include Murphy's sign, Rovsing's sign, psoas sign and obturator sign.  Skin:    General: Skin is warm.     Capillary Refill: Capillary refill takes less than 2 seconds.     Findings: No rash.  Neurological:     General: No focal deficit present.     Mental Status: She is alert and oriented to person, place, and time.  Psychiatric:        Mood and Affect: Mood normal.        Behavior: Behavior normal.         Assessment/plan: 1. Disorder of cecum Pain has improved. No red flags or surgical abdominal exam. Repeat xray, if still distended will get CT and discuss with dr. Leone Payor about seeing sooner. She has f/u with GI in April.  - DG Abd 1 View; Future  2. Generalized abdominal pain Chronic abdominal pain appears at baseline. See above.     This visit occurred during the SARS-CoV-2 public health emergency.  Safety protocols were in place, including screening questions prior to the visit, additional usage of staff PPE, and extensive cleaning of exam room while observing appropriate contact time as indicated for disinfecting solutions.     Return if symptoms worsen or fail to improve.   Orland Mustard, MD [facility]   11/21/2020

## 2020-11-21 NOTE — Patient Instructions (Signed)
-  xray today to see if cecum better. If still distended im going to order CT and will talk to dr. Leone Payor regardless.   -blood in stool, worsening pain, fever/chills: ER.    Talk soon- Dr. Artis Flock

## 2020-12-05 DIAGNOSIS — N898 Other specified noninflammatory disorders of vagina: Secondary | ICD-10-CM | POA: Diagnosis not present

## 2020-12-05 DIAGNOSIS — Z3040 Encounter for surveillance of contraceptives, unspecified: Secondary | ICD-10-CM | POA: Diagnosis not present

## 2020-12-05 DIAGNOSIS — Z01411 Encounter for gynecological examination (general) (routine) with abnormal findings: Secondary | ICD-10-CM | POA: Diagnosis not present

## 2020-12-05 DIAGNOSIS — Z6829 Body mass index (BMI) 29.0-29.9, adult: Secondary | ICD-10-CM | POA: Diagnosis not present

## 2020-12-10 ENCOUNTER — Ambulatory Visit: Payer: BC Managed Care – PPO | Admitting: Internal Medicine

## 2020-12-16 ENCOUNTER — Other Ambulatory Visit: Payer: Self-pay

## 2020-12-16 ENCOUNTER — Encounter (HOSPITAL_COMMUNITY): Payer: Self-pay | Admitting: Emergency Medicine

## 2020-12-16 ENCOUNTER — Ambulatory Visit (HOSPITAL_COMMUNITY)
Admission: RE | Admit: 2020-12-16 | Discharge: 2020-12-16 | Disposition: A | Discharge status: Home / Self Care | Payer: BC Managed Care – PPO | Source: Ambulatory Visit | Attending: Family Medicine | Admitting: Family Medicine

## 2020-12-16 ENCOUNTER — Telehealth: Payer: Self-pay | Admitting: Internal Medicine

## 2020-12-16 ENCOUNTER — Ambulatory Visit (HOSPITAL_COMMUNITY)
Admission: EM | Admit: 2020-12-16 | Discharge: 2020-12-16 | Disposition: A | Discharge status: Home / Self Care | Payer: BC Managed Care – PPO | Attending: Family Medicine | Admitting: Family Medicine

## 2020-12-16 ENCOUNTER — Telehealth: Payer: Self-pay

## 2020-12-16 DIAGNOSIS — R1084 Generalized abdominal pain: Secondary | ICD-10-CM

## 2020-12-16 DIAGNOSIS — R1030 Lower abdominal pain, unspecified: Secondary | ICD-10-CM | POA: Diagnosis not present

## 2020-12-16 DIAGNOSIS — Z9049 Acquired absence of other specified parts of digestive tract: Secondary | ICD-10-CM | POA: Diagnosis not present

## 2020-12-16 DIAGNOSIS — M545 Low back pain, unspecified: Secondary | ICD-10-CM | POA: Diagnosis not present

## 2020-12-16 DIAGNOSIS — R109 Unspecified abdominal pain: Secondary | ICD-10-CM | POA: Diagnosis not present

## 2020-12-16 LAB — POCT URINALYSIS DIPSTICK, ED / UC
Bilirubin Urine: NEGATIVE
Glucose, UA: NEGATIVE mg/dL
Ketones, ur: NEGATIVE mg/dL
Leukocytes,Ua: NEGATIVE
Nitrite: NEGATIVE
Protein, ur: NEGATIVE mg/dL
Specific Gravity, Urine: 1.015 (ref 1.005–1.030)
Urobilinogen, UA: 0.2 mg/dL (ref 0.0–1.0)
pH: 6.5 (ref 5.0–8.0)

## 2020-12-16 MED ORDER — KETOROLAC TROMETHAMINE 30 MG/ML IJ SOLN
30.0000 mg | Freq: Once | INTRAMUSCULAR | Status: AC
  Administered 2020-12-16: 30 mg via INTRAMUSCULAR

## 2020-12-16 MED ORDER — TRAMADOL HCL 50 MG PO TABS: 50.0000 mg | ORAL_TABLET | Freq: Four times a day (QID) | ORAL | 0 refills | Status: DC | PRN

## 2020-12-16 MED ORDER — TAMSULOSIN HCL 0.4 MG PO CAPS: 0.4000 mg | ORAL_CAPSULE | Freq: Every day | ORAL | 0 refills | Status: DC

## 2020-12-16 MED ORDER — KETOROLAC TROMETHAMINE 30 MG/ML IJ SOLN
INTRAMUSCULAR | Status: AC
  Filled 2020-12-16: qty 1

## 2020-12-16 NOTE — Telephone Encounter (Signed)
Patient called states she is having a lot of abdominal and back pain. Said she can hardly walk at this point from the pain and is seeking advise.

## 2020-12-16 NOTE — Telephone Encounter (Signed)
Left message for patient to call back  

## 2020-12-16 NOTE — Discharge Instructions (Addendum)
You may have a kidney stone. There is blood in your urine today.  We will culture your urine to be sure that there is no infection as well. We will be in touch with any abnormal results that require further treatment.  I have sent in flomax for you to take daily   I have sent in tramadol for you to take one tablet every 6 hours as needed for pain  Follow up with this office or with primary care if symptoms are persisting.  Follow up in the ER for high fever, trouble swallowing, trouble breathing, other concerning symptoms.

## 2020-12-16 NOTE — Telephone Encounter (Signed)
Please schedule an in office follow up with Dr. Artis Flock.  Thank You

## 2020-12-16 NOTE — Telephone Encounter (Signed)
Patient went to the ED.  She is having pain that started in her abdomen on Saturday, now if lower back pain.  She is having difficulty ambulating.  She is in the Urgent care now.  She is advised to remain until she is seen.  She will call back if they feel her pain is GI for an appointment here.

## 2020-12-16 NOTE — Telephone Encounter (Signed)
Please see below.

## 2020-12-16 NOTE — Telephone Encounter (Signed)
Pt called stating she was seen at Urgent Care today for lower back pain. Pt states they told her it was possibly kidney stones but could not do a scan on her. Pt asked if Dr. Artis Flock could put in an order for her to get a CT renal done. Please advise.

## 2020-12-16 NOTE — ED Provider Notes (Signed)
[facility]   CC: UTI  SUBJECTIVE:  Sierra Hodge is a 45 y.o. female who complains of urinary frequency, urgency and dysuria for the past 4 days. Patient denies a precipitating event, recent sexual encounter, excessive caffeine intake. Localizes the pain to the lower abdomen and bilateral flanks. Pain is intermittent and describes it as sharp. Has tried OTC ibuprofen with temporary relief. Reports that she cannot get comfortable to sleep at night. Has hx intersitial cystitis and endometriosis as well. Reports that she has hx of inflamed bowel and is followed by GI. Denies to similar symptoms in the past.  Denies fever, chills, nausea, vomiting, abnormal vaginal discharge or bleeding, hematuria.    LMP: No LMP recorded. (Menstrual status: IUD).  ROS: As in HPI.  All other pertinent ROS negative.     Past Medical History:  Diagnosis Date  . Chronic interstitial cystitis   . Complication of anesthesia    hard to wake after anesthesia  . Endometriosis   . Fatty liver   . Headache   . Hyperlipidemia   . IUD (intrauterine device) in place    Mirena, Dr. Pennie Rushing  . Other psoriasis    Past Surgical History:  Procedure Laterality Date  . CESAREAN SECTION     2008, 2010  . CHOLECYSTECTOMY N/A 07/07/2017   Procedure: LAPAROSCOPIC CHOLECYSTECTOMY;  Surgeon: Abigail Miyamoto, MD;  Location: [facility];  Service: General;  Laterality: N/A;  . CYSTO WITH HYDRODISTENSION  03/29/2012   Procedure: CYSTOSCOPY/HYDRODISTENSION;  Surgeon: Martina Sinner, MD;  Location: [facility];  Service: Urology;  Laterality: N/A;  with Instillation of Marcaine and Pyridium   . ESOPHAGOGASTRODUODENOSCOPY  2018  . LAPAROSCOPY  03/29/2012   Procedure: LAPAROSCOPY OPERATIVE;  Surgeon: Hal Morales, MD;  Location: [facility];  Service: Gynecology;  Laterality: N/A;  with Fulguration of Endometriosis and Peritoneal Biopsy  . TONSILLECTOMY    . TUBAL LIGATION  2010  . UMBILICAL HERNIA  REPAIR  10/2010   Allergies  Allergen Reactions  . Morphine And Related Itching  . Mobic [Meloxicam] Other (See Comments)    bloating   Current Facility-Administered Medications on File Prior to Encounter  Medication Dose Route Frequency Provider Last Rate Last Admin  . gadopentetate dimeglumine (MAGNEVIST) injection 13 mL  13 mL Intravenous Once PRN Anson Fret, MD       Current Outpatient Medications on File Prior to Encounter  Medication Sig Dispense Refill  . albuterol (VENTOLIN HFA) 108 (90 Base) MCG/ACT inhaler Inhale 2 puffs into the lungs every 6 (six) hours as needed for wheezing or shortness of breath. 18 g 3  . clobetasol ointment (TEMOVATE) 0.05 % Apply topically daily as needed. 60 g 5  . hydrOXYzine (ATARAX/VISTARIL) 10 MG tablet hydroxyzine HCl 10 mg tablet  TAKE 1 TABLET BY MOUTH EVERYDAY AT BEDTIME    . Hyoscyamine Sulfate SL (LEVSIN/SL) 0.125 MG SUBL Place 0.125 mg under the tongue 3 (three) times daily before meals. As needed for bloating and cramping 30 tablet 2  . ibuprofen (ADVIL,MOTRIN) 200 MG tablet Take 400-600 mg by mouth as needed.    Marland Kitchen levonorgestrel (MIRENA) 20 MCG/24HR IUD 1 each by Intrauterine route once.    . montelukast (SINGULAIR) 10 MG tablet montelukast 10 mg tablet    . Omega-3 1000 MG CAPS Take by mouth.    . pentosan polysulfate (ELMIRON) 100 MG capsule Take 100 mg by mouth 3 (three) times daily. rx per gyn    . Pumpkin  Seed-Soy Germ (AZO BLADDER CONTROL/GO-LESS PO) Azo Bladder Control    . triamcinolone lotion (KENALOG) 0.1 % triamcinolone acetonide 0.1 % lotion    . VITAMIN D PO Take by mouth daily.    . Vitamin E 100 units TABS Take by mouth.    Timmothy Sours 93 MCG/ACT EXHU      Social History   Socioeconomic History  . Marital status: Married    Spouse name: Not on file  . Number of children: 2  . Years of education: 74  . Highest education level: Not on file  Occupational History  . Occupation: Medical interpreter    Comment:  Spanish  Tobacco Use  . Smoking status: Never Smoker  . Smokeless tobacco: Never Used  Vaping Use  . Vaping Use: Never used  Substance and Sexual Activity  . Alcohol use: Yes    Comment: rarely   . Drug use: No  . Sexual activity: Yes    Partners: Male    Birth control/protection: I.U.D., Surgical    Comment: BTL   Other Topics Concern  . Not on file  Social History Narrative   Married    Employed as a Research officer, trade union,  mainly medical   From [place] ; 2 daughters 2008, 2010    Rare alcohol, never smoker no drug use   Right handed   Caffeine: 1 or 2 cups a week   Social Determinants of Health   Financial Resource Strain: Not on file  Food Insecurity: Not on file  Transportation Needs: Not on file  Physical Activity: Not on file  Stress: Not on file  Social Connections: Not on file  Intimate Partner Violence: Not on file   Family History  Problem Relation Age of Onset  . Alzheimer's disease Father   . Hyperlipidemia Mother   . Migraines Mother   . Allergic rhinitis Mother   . Heart disease Other        paternal family  . Diabetes Paternal Uncle   . Hyperlipidemia Sister   . Colon cancer Neg Hx   . Breast cancer Neg Hx     OBJECTIVE:  Vitals:   12/16/20 1118  BP: 110/73  Pulse: 60  Resp: 17  Temp: 98.3 F (36.8 C)  TempSrc: Oral  SpO2: 98%   General appearance: AOx3 in no acute distress HEENT: NCAT. Oropharynx clear.  Lungs: clear to auscultation bilaterally without adventitious breath sounds Heart: regular rate and rhythm. Radial pulses 2+ symmetrical bilaterally Abdomen: soft; non-distended; suprapubic tenderness; bowel sounds present; no guarding or rebound tenderness Back: bilateral CVA tenderness Extremities: no edema; symmetrical with no gross deformities Skin: warm and dry Neurologic: Ambulates from chair to exam table without difficulty Psychological: alert and cooperative; normal mood and affect  Labs Reviewed  POCT URINALYSIS  DIPSTICK, ED / UC - Abnormal; Notable for the following components:      Result Value   Hgb urine dipstick MODERATE (*)    All other components within normal limits  URINE CULTURE    ASSESSMENT & PLAN:  1. Acute bilateral low back pain without sciatica   2. Lower abdominal pain     Meds ordered this encounter  Medications  . tamsulosin (FLOMAX) 0.4 MG CAPS capsule    Sig: Take 1 capsule (0.4 mg total) by mouth daily.    Dispense:  30 capsule    Refill:  0    Order Specific Question:   Supervising Provider    Answer:   Merrilee Jansky [id number]  .  traMADol (ULTRAM) 50 MG tablet    Sig: Take 1 tablet (50 mg total) by mouth every 6 (six) hours as needed.    Dispense:  15 tablet    Refill:  0    Order Specific Question:   Supervising Provider    Answer:   Merrilee Jansky [id number]  . ketorolac (TORADOL) 30 MG/ML injection 30 mg   Toradol 30mg  IM in office today Prescribed flomax Prescribed tramadol for pain prn UA with moderate blood Urine culture sent given pain location We will call you with abnormal results that need further treatment Push fluids and get plenty of rest Differentials include msk origin, endometriosis pain, pelvic pain related to IC Follow up with PCP if symptoms persists Return here or go to ER if you have any new or worsening symptoms such as fever, worsening abdominal pain, nausea/vomiting, flank pain  Outlined signs and symptoms indicating need for more acute intervention Patient verbalized understanding After Visit Summary given     , NP 12/16/20 1315

## 2020-12-16 NOTE — Telephone Encounter (Signed)
Did stat stone protocol to [place] CT, we may need to change that. Have another provider change order if needed to change place. I did it stat.  Orland Mustard, MD [facility]

## 2020-12-16 NOTE — Telephone Encounter (Signed)
Okay to place order for CT?

## 2020-12-16 NOTE — ED Triage Notes (Signed)
Pt presents with right lower abdominal pain and left sided low back pain xs 4 days. States has taken Ibuprofen with some relief.

## 2020-12-17 ENCOUNTER — Telehealth: Payer: Self-pay

## 2020-12-17 LAB — URINE CULTURE

## 2020-12-17 NOTE — Telephone Encounter (Signed)
You can let her know her CT scan was negative for kidney stones. There was a nonspecific finding in some of the space in front of her lower spine and radiology recommended we may need to get an MRI though I will defer that decision to her PCP.   Sierra Hodge. [name], MD 12/17/2020 9:10 AM

## 2020-12-17 NOTE — Telephone Encounter (Cosign Needed)
[facility] Radiology called with Stat CT results.   Imaging report in Epic:  IMPRESSION: 1. No evidence of urinary tract calculi or obstructive uropathy. 2. Subtle fat stranding within the retroperitoneal soft tissues anterior to the L3/L4 disc space. No adjacent bony abnormalities to suggest discitis or osteomyelitis. Appearance is nonspecific. If further evaluation of the lower lumbar spine is desired given the patient's complaint of back pain, MRI could be considered.

## 2020-12-17 NOTE — Telephone Encounter (Signed)
I spoke with the pt to give message below.

## 2020-12-17 NOTE — Progress Notes (Signed)
I called pt to make her aware to follow up in office. Pt was transferred to the front desk.

## 2020-12-19 NOTE — Telephone Encounter (Signed)
Pt completed this on 12/16/20-FYI

## 2020-12-23 ENCOUNTER — Other Ambulatory Visit: Payer: Self-pay

## 2020-12-23 ENCOUNTER — Encounter: Payer: Self-pay | Admitting: Internal Medicine

## 2020-12-23 ENCOUNTER — Ambulatory Visit: Payer: BC Managed Care – PPO | Admitting: Internal Medicine

## 2020-12-23 VITALS — BP 112/77 | HR 78 | Ht 59.0 in | Wt 139.4 lb

## 2020-12-23 DIAGNOSIS — G8929 Other chronic pain: Secondary | ICD-10-CM

## 2020-12-23 DIAGNOSIS — R194 Change in bowel habit: Secondary | ICD-10-CM | POA: Diagnosis not present

## 2020-12-23 DIAGNOSIS — R935 Abnormal findings on diagnostic imaging of other abdominal regions, including retroperitoneum: Secondary | ICD-10-CM

## 2020-12-23 DIAGNOSIS — R1031 Right lower quadrant pain: Secondary | ICD-10-CM

## 2020-12-23 MED ORDER — DICYCLOMINE HCL 20 MG PO TABS: 20.0000 mg | ORAL_TABLET | Freq: Three times a day (TID) | ORAL | 0 refills | Status: DC

## 2020-12-23 MED ORDER — PLENVU 140 G PO SOLR: 1.0000 | ORAL | 0 refills | Status: DC

## 2020-12-23 NOTE — Patient Instructions (Signed)
You have been scheduled for a colonoscopy. Please follow written instructions given to you at your visit today.  Please use the Plenvu sample you were given today. If you use inhalers (even only as needed), please bring them with you on the day of your procedure.  We have sent the following medications to your pharmacy for you to pick up at your convenience: Dicyclomine   Due to recent changes in healthcare laws, you may see the results of your imaging and laboratory studies on MyChart before your provider has had a chance to review them.  We understand that in some cases there may be results that are confusing or concerning to you. Not all laboratory results come back in the same time frame and the provider may be waiting for multiple results in order to interpret others.  Please give [place] 48 hours in order for your provider to thoroughly review all the results before contacting the office for clarification of your results.   I appreciate the opportunity to care for you. Stan Head, MD, [facility]

## 2020-12-23 NOTE — Progress Notes (Signed)
Sierra Hodge 45 y.o. 1976/02/19 [id number]  Assessment & Plan:   Encounter Diagnoses  Name Primary?  . Chronic RLQ pain Yes  . Abnormal x-ray of abdomen   . Altered bowel habits      She has interstitial cystitis and IBS is quite common in these patients.  I suspect that is what is going on but I think it is prudent to perform a colonoscopy.  The risks and benefits as well as alternatives of endoscopic procedure(s) have been discussed and reviewed. All questions answered. The patient agrees to proceed.  Dicyclomine  before meals Meds ordered this encounter  Medications  . dicyclomine (BENTYL) 20 MG tablet    Sig: Take 1 tablet (20 mg total) by mouth 3 (three) times daily before meals.    Dispense:  90 tablet    Refill:  0  . PEG-KCl-NaCl-NaSulf-Na Asc-C (PLENVU) 140 g SOLR    Sig: Take 1 kit by mouth as directed.    Dispense:  1 each    Refill:  0   I appreciate the opportunity to care for this patient. CC: Orma Flaming, MD   Subjective:   Chief Complaint: Right lower quadrant pain  HPI The patient is a 45 year old woman with a history of upper abdominal pain and fatty liver disease nonalcoholic last seen by me in November 2020 who presents with recent problems with right lower quadrant abdominal pain can be quite severe and radiating into the right back.  She had a CT urogram for this in late March which was unrevealing, although there was some nonspecific retroperitoneal inflammation in front of L2-3.  She had hematuria but she has that chronically and intermittently because of her interstitial cystitis.  She notices that when she eats she feels very full and has these pains if she eats food.  She has noticed fried foods might make it worse.  She was taking some sort of digestive enzyme called digestive basic which has protease is in amylases and other enzymes and it and thinks that was helping and wonders if it got worse when she stopped it because it was  expensive.  She has been moving her bowels most of the time and does get relief when she defecates.  She had had problems over the past several weeks she had a KUB in late February that showed a 10 cm cecum that resolved on a follow-up KUB and was not seen on the CT urogram.  She has changed from eating a spinach omelette in the morning and has been trying to eat a high-fiber cereal.  However in general she is often afraid to eat because it triggers some of these pains and these "attacks".  She has been okay for the past couple of weeks.  She has not tried any antispasmodic type medication.   Wt Readings from Last 3 Encounters:  12/23/20 139 lb 6.4 oz (63.2 kg)  11/21/20 140 lb 12.8 oz (63.9 kg)  11/10/20 141 lb 12.8 oz (64.3 kg)    Allergies  Allergen Reactions  . Morphine And Related Itching  . Mobic [Meloxicam] Other (See Comments)    bloating   Current Meds  Medication Sig  . albuterol (VENTOLIN HFA) 108 (90 Base) MCG/ACT inhaler Inhale 2 puffs into the lungs every 6 (six) hours as needed for wheezing or shortness of breath.  . clobetasol ointment (TEMOVATE) 0.05 % Apply topically daily as needed.  . hydrOXYzine (ATARAX/VISTARIL) 10 MG tablet hydroxyzine HCl 10 mg tablet  TAKE 1  TABLET BY MOUTH EVERYDAY AT BEDTIME  . ibuprofen (ADVIL,MOTRIN) 200 MG tablet Take 400-600 mg by mouth as needed.  Marland Kitchen levonorgestrel (MIRENA) 20 MCG/24HR IUD 1 each by Intrauterine route once.  . Omega-3 1000 MG CAPS Take by mouth.  . triamcinolone lotion (KENALOG) 0.1 % triamcinolone acetonide 0.1 % lotion  . VITAMIN D PO Take by mouth daily.  . Vitamin E 100 units TABS Take by mouth.   Past Medical History:  Diagnosis Date  . Chronic interstitial cystitis   . Complication of anesthesia    hard to wake after anesthesia  . Endometriosis   . Fatty liver   . Headache   . Hyperlipidemia   . IUD (intrauterine device) in place    Mirena, Dr. Leo Grosser  . Other psoriasis    Past Surgical History:   Procedure Laterality Date  . CESAREAN SECTION     2008, 2010  . CHOLECYSTECTOMY N/A 07/07/2017   Procedure: LAPAROSCOPIC CHOLECYSTECTOMY;  Surgeon: Coralie Keens, MD;  Location: [facility];  Service: General;  Laterality: N/A;  . CYSTO WITH HYDRODISTENSION  03/29/2012   Procedure: CYSTOSCOPY/HYDRODISTENSION;  Surgeon: Reece Packer, MD;  Location: [facility];  Service: Urology;  Laterality: N/A;  with Instillation of Marcaine and Pyridium   . ESOPHAGOGASTRODUODENOSCOPY  2018  . LAPAROSCOPY  03/29/2012   Procedure: LAPAROSCOPY OPERATIVE;  Surgeon: Eldred Manges, MD;  Location: [facility];  Service: Gynecology;  Laterality: N/A;  with Fulguration of Endometriosis and Peritoneal Biopsy  . TONSILLECTOMY    . TUBAL LIGATION  2010  . UMBILICAL HERNIA REPAIR  10/2010   Social History   Social History Narrative   Married    Employed as a Administrator, sports,  mainly medical   From [place] ; 2 daughters 2008, 2010    Rare alcohol, never smoker no drug use   Right handed   Caffeine: 1 or 2 cups a week   family history includes Allergic rhinitis in her mother; Alzheimer's disease in her father; Diabetes in her paternal uncle; Heart disease in an other family member; Hyperlipidemia in her mother and sister; Migraines in her mother.   Review of Systems See HPI  Objective:   Physical Exam BP 112/77   Pulse 78   Ht _0  (1.499 m)   Wt 139 lb 6.4 oz (63.2 kg)   SpO2 98%   BMI 28.16 kg/m  NAD Lungs cta Back nontender no CVAT Cor Nl abd soft w/ minimal RLQ tenderness BS +

## 2020-12-25 ENCOUNTER — Ambulatory Visit: Payer: BC Managed Care – PPO | Admitting: Family Medicine

## 2020-12-26 ENCOUNTER — Other Ambulatory Visit: Payer: Self-pay

## 2020-12-26 ENCOUNTER — Ambulatory Visit (AMBULATORY_SURGERY_CENTER): Payer: BC Managed Care – PPO | Admitting: Internal Medicine

## 2020-12-26 ENCOUNTER — Encounter: Payer: Self-pay | Admitting: Internal Medicine

## 2020-12-26 VITALS — BP 93/57 | HR 60 | Temp 97.7°F | Resp 15 | Ht 59.0 in | Wt 139.0 lb

## 2020-12-26 DIAGNOSIS — K6389 Other specified diseases of intestine: Secondary | ICD-10-CM

## 2020-12-26 DIAGNOSIS — K633 Ulcer of intestine: Secondary | ICD-10-CM

## 2020-12-26 DIAGNOSIS — G8929 Other chronic pain: Secondary | ICD-10-CM | POA: Diagnosis not present

## 2020-12-26 DIAGNOSIS — R1031 Right lower quadrant pain: Secondary | ICD-10-CM | POA: Diagnosis not present

## 2020-12-26 MED ORDER — SODIUM CHLORIDE 0.9 % IV SOLN: 500.0000 mL | Freq: Once | INTRAVENOUS | Status: DC

## 2020-12-26 NOTE — Progress Notes (Signed)
Pt. Reports her gastric reflux causes a lot of burping - which she is experiencing today, which "makes it hard to breathe."  Pt. Does not appear to be in any type of distress.  Skin warm and dry, color normal, does not appear anxious in any way.

## 2020-12-26 NOTE — Op Note (Signed)
[facility] Patient Name: Sierra Hodge Procedure Date: 12/26/2020 1:21 PM MRN: [id number] Endoscopist: Iva Boop , MD Age: 45 Referring MD:  Date of Birth: [birth year] Gender: Female Account #: [id number] Procedure:                Colonoscopy Indications:              Abdominal pain in the right lower quadrant,                            Abnormal abdominal x-ray of the GI tract dilated                            Cecum on one AXR Medicines:                Propofol per Anesthesia, Monitored Anesthesia Care Procedure:                Pre-Anesthesia Assessment:                           - Prior to the procedure, a History and Physical                            was performed, and patient medications and                            allergies were reviewed. The patient's tolerance of                            previous anesthesia was also reviewed. The risks                            and benefits of the procedure and the sedation                            options and risks were discussed with the patient.                            All questions were answered, and informed consent                            was obtained. Prior Anticoagulants: The patient has                            taken no previous anticoagulant or antiplatelet                            agents. ASA Grade Assessment: II - A patient with                            mild systemic disease. After reviewing the risks                            and benefits, the patient was deemed in  satisfactory condition to undergo the procedure.                           After obtaining informed consent, the colonoscope                            was passed under direct vision. Throughout the                            procedure, the patient's blood pressure, pulse, and                            oxygen saturations were monitored continuously. The                            Olympus PCF-H190DL  (#[id number]) Colonoscope was                            introduced through the anus and advanced to the the                            terminal ileum, with identification of the                            appendiceal orifice and IC valve. The colonoscopy                            was performed without difficulty. The patient                            tolerated the procedure well. The quality of the                            bowel preparation was excellent. The bowel                            preparation used was Plenvu via split dose                            instruction. The ileocecal valve, appendiceal                            orifice, and rectum were photographed. Scope In: 1:44:04 PM Scope Out: 2:00:37 PM Scope Withdrawal Time: 0 hours 13 minutes 49 seconds  Total Procedure Duration: 0 hours 16 minutes 33 seconds  Findings:                 The perianal and digital rectal examinations were                            normal.                           Inflammation characterized by serpentine  ulcerations was found in the terminal ileum.                            Biopsies were taken with a cold forceps for                            histology. Verification of patient identification                            for the specimen was done. Estimated blood loss was                            minimal.                           A few diverticula were found in the sigmoid colon.                           The exam was otherwise without abnormality on                            direct and retroflexion views. Complications:            No immediate complications. Estimated Blood Loss:     Estimated blood loss was minimal. Impression:               - Ileitis. Biopsied. ? Crohn's, ? NSAID (some hx                            ibuprofen)                           - Diverticulosis in the sigmoid colon.                           - The examination was otherwise normal on  direct                            and retroflexion views. Recommendation:           - Patient has a contact number available for                            emergencies. The signs and symptoms of potential                            delayed complications were discussed with the                            patient. Return to normal activities tomorrow.                            Written discharge instructions were provided to the                            patient.                           -  Resume previous diet.                           - Continue present medications.                           - Await pathology results.                           - try 1/2 tablet dicyclomine to see if tolerable                            (sedation at 20 mg) Iva Boop, MD 12/26/2020 2:09:22 PM This report has been signed electronically.

## 2020-12-26 NOTE — Progress Notes (Signed)
Called to room to assist during endoscopic procedure.  Patient ID and intended procedure confirmed with present staff. Received instructions for my participation in the procedure from the performing physician.  

## 2020-12-26 NOTE — Progress Notes (Signed)
To pacu, VSS. Report to [email or web address].tb 

## 2020-12-26 NOTE — Patient Instructions (Addendum)
There were some small ulcers at the end of the small intestine, we call this region the terminal ileum.   Sometimes we see these in people and they are just there for reasons that are unclear, and you I wonder if you might not have a problem called Crohn's disease.  Once I get the biopsy results I will communicate with you about what they show, the biopsies can help [place] make that diagnosis but sometimes it is still unclear.  Another cause of these ulcers is medications like Advil or ibuprofen.  Usually it requires people to take a lot of that on a frequent basis.  I am not sure if that is the case and you.  Try half a tablet of the dicyclomine to see if that helps with the pain but does not make you so sleepy.  I am providing samples of calmoseptine ointment to help heal your bottom.  I appreciate the opportunity to care for you. Iva Boop, MD, FACG YOU HAD AN ENDOSCOPIC PROCEDURE TODAY AT THE [facility]:   Refer to the procedure report that was given to you for any specific questions about what was found during the examination.  If the procedure report does not answer your questions, please call your gastroenterologist to clarify.  If you requested that your care partner not be given the details of your procedure findings, then the procedure report has been included in a sealed envelope for you to review at your convenience later.  YOU SHOULD EXPECT: Some feelings of bloating in the abdomen. Passage of more gas than usual.  Walking can help get rid of the air that was put into your GI tract during the procedure and reduce the bloating. If you had a lower endoscopy (such as a colonoscopy or flexible sigmoidoscopy) you may notice spotting of blood in your stool or on the toilet paper. If you underwent a bowel prep for your procedure, you may not have a normal bowel movement for a few days.  Please Note:  You might notice some irritation and congestion in your nose or some drainage.   This is from the oxygen used during your procedure.  There is no need for concern and it should clear up in a day or so.  SYMPTOMS TO REPORT IMMEDIATELY:   Following lower endoscopy (colonoscopy or flexible sigmoidoscopy):  Excessive amounts of blood in the stool  Significant tenderness or worsening of abdominal pains  Swelling of the abdomen that is new, acute  Fever of 100F or higher   For urgent or emergent issues, a gastroenterologist can be reached at any hour by calling [phone]. Do not use MyChart messaging for urgent concerns.    DIET:  We do recommend a small meal at first, but then you may proceed to your regular diet.  Drink plenty of fluids but you should avoid alcoholic beverages for 24 hours.  MEDICATIONS: Continue present medications. Try 1/2 tablet of Dicyclomine to see if tolerable. Patient given instruction per Dr. Leone Payor and samples of calmoseptine from her recovery nurse to take home today.  Please see handouts given to you by your recovery nurse.  Thank you for allowing [place] to provide for your healthcare needs today.  ACTIVITY:  You should plan to take it easy for the rest of today and you should NOT DRIVE or use heavy machinery until tomorrow (because of the sedation medicines used during the test).    FOLLOW UP: Our staff will call the number listed  on your records 48-72 hours following your procedure to check on you and address any questions or concerns that you may have regarding the information given to you following your procedure. If we do not reach you, we will leave a message.  We will attempt to reach you two times.  During this call, we will ask if you have developed any symptoms of COVID 19. If you develop any symptoms (ie: fever, flu-like symptoms, shortness of breath, cough etc.) before then, please call [phone].  If you test positive for Covid 19 in the 2 weeks post procedure, please call and report this information to [place].    If any  biopsies were taken you will be contacted by phone or by letter within the next 1-3 weeks.  Please call us at [phone] if you have not heard about the biopsies in 3 weeks.    SIGNATURES/CONFIDENTIALITY: You and/or your care partner have signed paperwork which will be entered into your electronic medical record.  These signatures attest to the fact that that the information above on your After Visit Summary has been reviewed and is understood.  Full responsibility of the confidentiality of this discharge information lies with you and/or your care-partner.

## 2020-12-28 ENCOUNTER — Telehealth: Payer: Self-pay | Admitting: Nurse Practitioner

## 2020-12-28 NOTE — Telephone Encounter (Signed)
Dr. Leone Payor FYI: Patient called our answering service on Sunday, 12/28/2020 with complaints of left-sided facial pain which radiates to her left ear which started Friday night.  She underwent a colonoscopy earlier on Friday 12/26/2020 with Dr. Leone Payor.  The left-sided facial and ear pain started later in the evening.  She denies having any fever.  No visual changes, dizziness or facial droop.  No difficulty swallowing.  No drooling or saliva buildup.  Vies the patient to take Tylenol as needed, no NSAIDs based on possible ileitis per her colonoscopy.  Advised the patient go to urgent care for further evaluation if her left facial and ear pain persisted.  I advised the patient to contact her PCP [facility] morning during routine office hours to schedule an appointment for further evaluation.  In the meantime, she develops severe facial or ear pain, dizziness, visual changes or difficulty swallowing she will present to the ED for further evaluation.  Of note, she stated she had similar pain many years ago and thought it was a dental issue but her dentist assessed her teeth were fine.

## 2020-12-30 ENCOUNTER — Telehealth: Payer: Self-pay

## 2020-12-30 NOTE — Telephone Encounter (Signed)
  Follow up Call-  Call back number 12/26/2020  Post procedure Call Back phone  # [phone]  Permission to leave phone message Yes  Some recent data might be hidden     Patient questions:  Do you have a fever, pain , or abdominal swelling? No. Pain Score  0 *  Have you tolerated food without any problems? Yes.    Have you been able to return to your normal activities? Yes.    Do you have any questions about your discharge instructions: Diet   No. Medications  No. Follow up visit  No.  Do you have questions or concerns about your Care? no  Actions: * If pain score is 4 or above: No action needed, pain <4. 1. Have you developed a fever since your procedure? no  2.   Have you had an respiratory symptoms (SOB or cough) since your procedure? no  3.   Have you tested positive for COVID 19 since your procedure no  4.   Have you had any family members/close contacts diagnosed with the COVID 19 since your procedure?  no   If yes to any of these questions please route to Laverna Peace, RN and Karlton Lemon, RN

## 2021-01-05 ENCOUNTER — Ambulatory Visit: Payer: BC Managed Care – PPO | Admitting: Family Medicine

## 2021-01-06 ENCOUNTER — Telehealth: Payer: Self-pay | Admitting: Internal Medicine

## 2021-01-06 DIAGNOSIS — K529 Noninfective gastroenteritis and colitis, unspecified: Secondary | ICD-10-CM

## 2021-01-06 NOTE — Telephone Encounter (Signed)
Office please let her know bxs confirm inflammation in small intestine  She may have Crohn's disease - this can be difficult to diagnose and it is possible inflammation was from ibuprofen  Please do following:  1) Have her come to lab for IBD expanded panekl and fecal calprotectin to understand this better - I placed orders  2) schedule f/u me in 3-6 weeks  3) recall colonoscopy 10 yrs

## 2021-01-07 NOTE — Telephone Encounter (Signed)
Left message for patient to call back  

## 2021-01-08 NOTE — Telephone Encounter (Signed)
Patient notified of the results and recommendations.  She will come for labs on Monday when she returns from out of town.  Follow up scheduled for 5/13

## 2021-01-12 ENCOUNTER — Other Ambulatory Visit: Payer: BC Managed Care – PPO

## 2021-01-12 DIAGNOSIS — K529 Noninfective gastroenteritis and colitis, unspecified: Secondary | ICD-10-CM | POA: Diagnosis not present

## 2021-01-13 LAB — IBD EXPANDED PANEL
ACCA: 27 units (ref 0–90)
ALCA: 22 units (ref 0–60)
AMCA: 23 units (ref 0–100)
Atypical pANCA: NEGATIVE
gASCA: 27 units (ref 0–50)

## 2021-01-15 ENCOUNTER — Other Ambulatory Visit: Payer: Self-pay | Admitting: Internal Medicine

## 2021-01-16 ENCOUNTER — Other Ambulatory Visit: Payer: BC Managed Care – PPO

## 2021-01-16 DIAGNOSIS — K529 Noninfective gastroenteritis and colitis, unspecified: Secondary | ICD-10-CM

## 2021-01-21 LAB — CALPROTECTIN, FECAL: Calprotectin, Fecal: 46 ug/g (ref 0–120)

## 2021-01-28 DIAGNOSIS — Z20822 Contact with and (suspected) exposure to covid-19: Secondary | ICD-10-CM | POA: Diagnosis not present

## 2021-01-30 ENCOUNTER — Ambulatory Visit: Payer: BC Managed Care – PPO | Admitting: Internal Medicine

## 2021-01-30 ENCOUNTER — Encounter: Payer: Self-pay | Admitting: Internal Medicine

## 2021-01-30 VITALS — BP 86/52 | HR 64 | Ht 59.0 in | Wt 135.1 lb

## 2021-01-30 DIAGNOSIS — K760 Fatty (change of) liver, not elsewhere classified: Secondary | ICD-10-CM | POA: Diagnosis not present

## 2021-01-30 DIAGNOSIS — K529 Noninfective gastroenteritis and colitis, unspecified: Secondary | ICD-10-CM | POA: Diagnosis not present

## 2021-01-30 DIAGNOSIS — R1031 Right lower quadrant pain: Secondary | ICD-10-CM

## 2021-01-30 DIAGNOSIS — K589 Irritable bowel syndrome without diarrhea: Secondary | ICD-10-CM

## 2021-01-30 DIAGNOSIS — G8929 Other chronic pain: Secondary | ICD-10-CM

## 2021-01-30 MED ORDER — DICYCLOMINE HCL 20 MG PO TABS: 20.0000 mg | ORAL_TABLET | Freq: Three times a day (TID) | ORAL | 1 refills | Status: DC

## 2021-01-30 NOTE — Progress Notes (Signed)
Sierra Hodge 44 y.o. 1976/04/24 [id number]  Assessment & Plan:   Encounter Diagnoses  Name Primary?  . Ileitis Yes  . Chronic RLQ pain   . NAFLD (nonalcoholic fatty liver disease)   . Irritable bowel syndrome?      At this point we do not have a firm diagnosis.  I had wondered about Crohn's ileitis but 1 can get nonspecific inflammatory changes in the ileum perhaps the colonoscopy prep did that.  She does appear to have signs and symptoms of IBS and perhaps the inflammatory changes in the ileum were just a red herring and nonspecific.  Keep in mind she has a history of interstitial cystitis as well that may be at play.  I do not think she needs an MRI of the spine based upon those prelumbar spine fat stranding changes.  I suppose she could have had diverticulitis the cause that but was not evident on the scan though it seems unlikely.  At any rate though severe symptoms are gone.  She will continue her current diet changes and use of Kefir/yogurt for now.  I am going to have her do a lactulose hydrogen breath test testing for SIBO and she will continue dicyclomine.  I will see her back in July.  Further plans pending the above.   I appreciate the opportunity to care for this patient. CC: Orland Mustard, MD  Subjective:   Chief Complaint: Right lower quadrant pain bloating nausea  HPI Sierra Hodge is here for follow-up of abdominal pain, chronic right lower quadrant and history of nonalcoholic fatty liver disease.  She has had a work-up recently with a colonoscopy after she had some imaging that showed a dilated cecum.  I found serpentine ulcerations in the terminal ileum on colonoscopy on 12/26/2020 they showed inflammatory changes and granulation tissue but no granulomas.  IBD serology was entirely negative and a fecal calprotectin was negative.  She has recently been changing her diet eating more brown foods like brown rice and brown bread and has eliminated red meat or greatly  limited it.  She has also been taking Keppra daily and some yogurt she has increased fruits and vegetables.  Decreased starches.  She had a spell with fairly intense back pain prior to come seeing [place].  A CT renal stone study showed some fat stranding in the area in front of L3-4.  There was some recommendation to consider MRI to rule out a discitis etc.  That pain is really largely gone.  However she does get pressure in the back if she feels full in her colon.  Dicyclomine has helped her feel much better taken prior to meals with much less bloating.  She also will have nausea and belching if she does not take the dicyclomine.  She does relate that when she was younger and a child she took many many antibiotics for tonsillitis.  Wonders if this could have altered her bacterial flora. Allergies  Allergen Reactions  . Morphine And Related Itching  . Mobic [Meloxicam] Other (See Comments)    bloating   Current Meds  Medication Sig  . albuterol (VENTOLIN HFA) 108 (90 Base) MCG/ACT inhaler Inhale 2 puffs into the lungs every 6 (six) hours as needed for wheezing or shortness of breath.  . clobetasol ointment (TEMOVATE) 0.05 % Apply topically daily as needed.  . dicyclomine (BENTYL) 20 MG tablet TAKE 1 TABLET (20 MG TOTAL) BY MOUTH 3 (THREE) TIMES DAILY BEFORE MEALS.  . hydrOXYzine (ATARAX/VISTARIL) 10 MG tablet hydroxyzine  HCl 10 mg tablet  TAKE 1 TABLET BY MOUTH EVERYDAY AT BEDTIME  . levonorgestrel (MIRENA) 20 MCG/24HR IUD 1 each by Intrauterine route once.  . montelukast (SINGULAIR) 10 MG tablet Take 1 tablet by mouth daily.  Marland Kitchen triamcinolone lotion (KENALOG) 0.1 %   . VITAMIN D PO Take 1 tablet by mouth daily.  . Vitamin E 100 units TABS Take 1 tablet by mouth daily.   Past Medical History:  Diagnosis Date  . Asthma   . Chronic interstitial cystitis   . Complication of anesthesia    hard to wake after anesthesia  . Endometriosis   . Fatty liver   . Headache   . Hyperlipidemia   . IUD  (intrauterine device) in place    Mirena, Dr. Pennie Rushing  . Other psoriasis    Past Surgical History:  Procedure Laterality Date  . CESAREAN SECTION     2008, 2010  . CHOLECYSTECTOMY N/A 07/07/2017   Procedure: LAPAROSCOPIC CHOLECYSTECTOMY;  Surgeon: Abigail Miyamoto, MD;  Location: [facility];  Service: General;  Laterality: N/A;  . CYSTO WITH HYDRODISTENSION  03/29/2012   Procedure: CYSTOSCOPY/HYDRODISTENSION;  Surgeon: Martina Sinner, MD;  Location: [facility];  Service: Urology;  Laterality: N/A;  with Instillation of Marcaine and Pyridium   . ESOPHAGOGASTRODUODENOSCOPY  2018  . LAPAROSCOPY  03/29/2012   Procedure: LAPAROSCOPY OPERATIVE;  Surgeon: Hal Morales, MD;  Location: [facility];  Service: Gynecology;  Laterality: N/A;  with Fulguration of Endometriosis and Peritoneal Biopsy  . TONSILLECTOMY    . TUBAL LIGATION  2010  . UMBILICAL HERNIA REPAIR  10/2010   Social History   Social History Narrative   Married    Employed as a Research officer, trade union,  mainly medical   From [place] ; 2 daughters 2008, 2010    Rare alcohol, never smoker no drug use   Right handed   Caffeine: 1 or 2 cups a week   family history includes Allergic rhinitis in her mother; Alzheimer's disease in her father; Diabetes in her paternal uncle; Heart disease in an other family member; Hyperlipidemia in her mother and sister; Migraines in her mother.   Review of Systems As above  Objective:   Physical Exam BP (!) 86/52 (BP Location: Right Arm, Patient Position: Sitting, Cuff Size: Normal)   Pulse 64   Ht 4\' 11"  (1.499 m)   Wt 135 lb 2 oz (61.3 kg)   BMI 27.29 kg/m  Well-developed well-nourished Caucasian Hispanic woman in no acute distress Abdomen is soft nontender no organomegaly or mass

## 2021-01-30 NOTE — Patient Instructions (Addendum)
You have been given a testing kit to check for small intestine bacterial overgrowth (SIBO) which is completed by a company named [facility]. Make sure to return your test in the mail using the return mailing label given to you along with the kit. Your demographic and insurance information have already been sent to the company and they should be in contact with you over the next week regarding this test. [facility] will collect an upfront charge of $99.74 for commercial insurance plans and $209.74 is you are paying cash. Make sure to discuss with [facility] PRIOR to having the test if they have gotten informatoin from your insurance company as to how much your testing will cost out of pocket, if any. Please keep in mind that you will be getting a call from phone number [phone] or a similar number. If you do not hear from them within this time frame, please call our office at [phone].   Continue your dicyclomine, refilled..   I appreciate the opportunity to care for you. Silvano Rusk, MD, [facility]

## 2021-03-03 ENCOUNTER — Encounter: Payer: Self-pay | Admitting: Family

## 2021-03-03 ENCOUNTER — Ambulatory Visit: Payer: BC Managed Care – PPO | Admitting: Family

## 2021-03-03 ENCOUNTER — Other Ambulatory Visit: Payer: Self-pay

## 2021-03-03 VITALS — BP 102/64 | HR 64 | Temp 97.6°F | Resp 18 | Ht 58.5 in | Wt 138.0 lb

## 2021-03-03 DIAGNOSIS — J4520 Mild intermittent asthma, uncomplicated: Secondary | ICD-10-CM

## 2021-03-03 DIAGNOSIS — J3089 Other allergic rhinitis: Secondary | ICD-10-CM | POA: Diagnosis not present

## 2021-03-03 DIAGNOSIS — L299 Pruritus, unspecified: Secondary | ICD-10-CM | POA: Diagnosis not present

## 2021-03-03 MED ORDER — METHYLPREDNISOLONE ACETATE 40 MG/ML IJ SUSP
40.0000 mg | Freq: Once | INTRAMUSCULAR | Status: AC
  Administered 2021-03-03: 40 mg via INTRAMUSCULAR

## 2021-03-03 MED ORDER — TRIAMCINOLONE ACETONIDE 0.1 % EX OINT: TOPICAL_OINTMENT | CUTANEOUS | 0 refills | Status: DC

## 2021-03-03 NOTE — Patient Instructions (Addendum)
Itching- possible bug bite Continue triamcinolone using 1 application sparingly twice a day as needed.  Do not use on face, neck, groin, or armpit region. Start Zyrtec 10 mg in the morning to help with the itching. Caution as this can make you drowsy Depo Medrol 40 mg IM given  Mild intermittent asthma Continue albuterol 1 to 2 puffs every 4-6 hours as needed for cough, wheeze, tightness in chest, or shortness of breath.  Also may use albuterol 2 puffs 5 to 15 minutes prior to exercise  Perennial allergic rhinitis Continue montelukast 10 mg once a day Continue hydroxyzine at night as per your urologist May use saline nasal gel as needed for nasal dryness  Please let us know if this treatment plan is not working well for you Schedule a follow-up appointment in 6 months or sooner.

## 2021-03-03 NOTE — Progress Notes (Signed)
[address] [place] [address] Dept: [phone]  FOLLOW UP NOTE  Patient ID: Sierra Hodge, female    DOB: Jun 01, 1976  Age: 45 y.o. MRN: [id number] Date of Office Visit: 03/03/2021  Assessment  Chief Complaint: Rash (Itching, starts like an insect bite and then spreads into something bigger, started on left thigh now it's on her arm and the itching is terrible. )  HPI Sierra Hodge a 45 year old female presents today for an acute visit of itching.  She was last seen on June 06, 2018 by Dr. Nunzio Cobbs for mild intermittent asthma/exercise-induced bronchospasm, perennial allergic rhinitis, and recurrent maxillary sinusitis.  She reports that last week around Wednesday she developed a small red spot on her left upper thigh that was itchy.  She started scratching and it has gotten bigger.  She now has areas on her right upper inner thigh and her right forearm.  She reports that the rash is itchy and not painful.  She has tried triamcinolone cream and it helps some but it does not go away.  No one else in the household has this rash.  She denies any new products and also denies any correlations with foods.  She  denies no new medications.  She denies any recent travel or exposure to poison ivy.  She denies fever, weight loss, and night sweats.  She is not sure if this is due to bug bites.  She is outside and walks her dog every day.  The same thing happened at the same time last year.  She tried calling our office for an appointment but there was not an opening so she went to an urgent care and was told that they did not know what it was and what the cause was.  She was given a shot and this helped.  She is interested in getting a steroid injection today.  Perennial allergic rhinitis is reported as controlled with montelukast 10 mg once a day and hydroxyzine at night.  She reports that her urologist had her stop carbinoxamine due to it being a bad medication.  She is not using Xhance nasal  spray or saline rinse.  She reports dry nasal passages since turning on the air conditioning.  She denies rhinorrhea, nasal congestion, and postnasal drip.  She has not had any sinus infections since we last saw her.  Mild intermittent asthma/exercise-induced bronchospasm is reported as controlled with albuterol as needed.  She reports that she may have had tightness in her chest last week or it could have been due to coffee.  She denies any coughing, wheezing, shortness of breath,  and nocturnal awakenings due to breathing problems.  Since her last office visit she has not required any trips to the emergency room or urgent care due to breathing problems and has not required any systemic steroids due to breathing problems.  She has not had to use her albuterol inhaler in a long time.  She reports that her primary care physician refilled her albuterol inhaler.   Drug Allergies:  Allergies  Allergen Reactions   Morphine And Related Itching   Mobic [Meloxicam] Other (See Comments)    bloating    Review of Systems: Review of Systems  Constitutional:  Negative for chills and fever.  HENT:         Reports nasal dryness and denies rhinorrhea, nasal congestion, and postnasal drip  Eyes:        Denies itchy watery eyes  Respiratory:  Negative for cough, shortness  of breath and wheezing.        Reports tightness in her chest last week.  Denies coughing, wheezing, and shortness of breath  Cardiovascular:  Negative for chest pain and palpitations.  Gastrointestinal:        Denies heartburn and reflux symptoms  Genitourinary:  Negative for dysuria.  Skin:  Positive for itching and rash.  Neurological:  Negative for headaches.  Endo/Heme/Allergies:  Positive for environmental allergies.    Physical Exam: BP 102/64 (BP Location: Right Arm, Patient Position: Sitting, Cuff Size: Normal)   Pulse 64   Temp 97.6 F (36.4 C) (Temporal)   Resp 18   Ht 4' 10.5" (1.486 m)   Wt 138 lb (62.6 kg)   SpO2  99%   BMI 28.35 kg/m    Physical Exam Constitutional:      Appearance: Normal appearance.  HENT:     Head: Normocephalic and atraumatic.     Comments: Pharynx normal, eyes normal, ears unable to visualize right tympanic membrane due to cerumen. Left ear normal. Nose :bilateral lower turbinates mildly edematous and slightly erythematous with no drainage noted    Right Ear: Ear canal and external ear normal.     Left Ear: Tympanic membrane and ear canal normal.     Mouth/Throat:     Mouth: Mucous membranes are moist.     Pharynx: Oropharynx is clear.  Eyes:     Conjunctiva/sclera: Conjunctivae normal.  Cardiovascular:     Rate and Rhythm: Normal rate and regular rhythm.     Heart sounds: Normal heart sounds.  Pulmonary:     Effort: Pulmonary effort is normal.     Breath sounds: Normal breath sounds.     Comments: Lungs clear to auscultation Musculoskeletal:     Cervical back: Neck supple.  Skin:    General: Skin is warm.     Comments: Erythematous lesion noted on left upper inner thigh that blanches. Small slightly raised erythematous lesions noted on right upper inner thigh. Small erythematous lesions noted on right forearm. Excoriation marks noted.  Neurological:     Mental Status: She is alert and oriented to person, place, and time.  Psychiatric:        Mood and Affect: Mood normal.        Behavior: Behavior normal.        Thought Content: Thought content normal.        Judgment: Judgment normal.    Diagnostics: FVC 3.00 L, FEV1 2.77 L.  Predicted FVC 2.84 L, FEV1 2.30 L.  Spirometry indicates normal ventilatory function.  Assessment and Plan: 1. Pruritus   2. Mild intermittent asthma without complication   3. Perennial allergic rhinitis     Meds ordered this encounter  Medications   methylPREDNISolone acetate (DEPO-MEDROL) injection 40 mg   triamcinolone ointment (KENALOG) 0.1 %    Sig: Use 1 application sparingly twice a day as needed to red itchy areas. Do not  use on face, neck, groin, or armpit region    Dispense:  15 g    Refill:  0     Patient Instructions  Itching- possible bug bite Continue triamcinolone using 1 application sparingly twice a day as needed.  Do not use on face, neck, groin, or armpit region. Start Zyrtec 10 mg in the morning to help with the itching. Caution as this can make you drowsy Depo Medrol 40 mg IM given  Mild intermittent asthma Continue albuterol 1 to 2 puffs every 4-6 hours as needed for  cough, wheeze, tightness in chest, or shortness of breath.  Also may use albuterol 2 puffs 5 to 15 minutes prior to exercise  Perennial allergic rhinitis Continue montelukast 10 mg once a day Continue hydroxyzine at night as per your urologist May use saline nasal gel as needed for nasal dryness  Please let us know if this treatment plan is not working well for you Schedule a follow-up appointment in 6 months or sooner.  Return in about 6 months (around 09/02/2021), or if symptoms worsen or fail to improve.    Thank you for the opportunity to care for this patient.  Please do not hesitate to contact me with questions.  Nehemiah Settle, FNP [facility]

## 2021-03-25 ENCOUNTER — Ambulatory Visit: Payer: BC Managed Care – PPO | Admitting: Internal Medicine

## 2021-03-25 ENCOUNTER — Encounter: Payer: Self-pay | Admitting: Internal Medicine

## 2021-03-25 VITALS — BP 100/62 | HR 76 | Ht 58.5 in | Wt 136.0 lb

## 2021-03-25 DIAGNOSIS — R1031 Right lower quadrant pain: Secondary | ICD-10-CM | POA: Diagnosis not present

## 2021-03-25 DIAGNOSIS — K529 Noninfective gastroenteritis and colitis, unspecified: Secondary | ICD-10-CM | POA: Diagnosis not present

## 2021-03-25 DIAGNOSIS — K589 Irritable bowel syndrome without diarrhea: Secondary | ICD-10-CM | POA: Diagnosis not present

## 2021-03-25 DIAGNOSIS — K760 Fatty (change of) liver, not elsewhere classified: Secondary | ICD-10-CM | POA: Diagnosis not present

## 2021-03-25 DIAGNOSIS — G8929 Other chronic pain: Secondary | ICD-10-CM

## 2021-03-25 NOTE — Progress Notes (Signed)
Sierra Hodge 45 y.o. 23-Jul-1976 [id number]  Assessment & Plan:   Encounter Diagnoses  Name Primary?   Irritable bowel syndrome Yes   NAFLD (nonalcoholic fatty liver disease)    Chronic RLQ pain    Ileitis     We will hold off on SIBO evaluation at this time due to cost.  No breath test currently.  Handout regarding macronutrients to eat and the dangers and risks of hyperinsulinemia and insulin resistance.  These are provided to the patient.  The macronutrient handout includes tips on how to eat lower fat low carb and no processed food.  With her nonalcoholic fatty liver disease low-carb is the way to go.  This should help her other problems as well I believe.   She is commended on current efforts.  Continue current rx with dicyclomine as needed ultimately with diet changes I am hopeful she will be better.  And may not need medication.  Recall that she had ileitis question occult IBD.  When I did test the fecal calprotectin it was normal.  06/23/2021 is her next appointment with me  I appreciate the opportunity to care for her  Cc:Orland Mustard, MD    Subjective:   Chief Complaint: Follow-up of IBS symptoms right lower quadrant pain fatty liver  HPI The patient is a 45 year old woman with a history of IBS symptoms and with bloating as well as nonalcoholic fatty liver disease.  Previous colonoscopy has shown some ileal ulceration of unclear etiology.  I had recommended a breath test to look for SIBO at last visit but due to medical expenses she has held off on that.  She reports that 20 mg of dicyclomine taken before meals is making a big difference.  She has reduced in trying to eliminate processed food as processed foods cause cramps low back pain at times and aggravate her right lower quadrant pain.  She is using Kefir twice a day and is trying to eat healthier.  Previous CT scanning has demonstrated changes consistent with hepatic steatosis.  Going to [place] very  soon to visit family Wt Readings from Last 3 Encounters:  03/25/21 136 lb (61.7 kg)  03/03/21 138 lb (62.6 kg)  01/30/21 135 lb 2 oz (61.3 kg)   She has a book she is currently reading called the good gut diet.  Trying to improve fiber and natural/real food ingestion and avoiding processed foods to help her feel better.  She reminds me that she has interstitial cystitis so she cannot eat or drink acidic foods.  She feels like her interstitial cystitis is improved with improvement in her diet and reduction in processed foods as well.  Allergies  Allergen Reactions   Morphine And Related Itching   Mobic [Meloxicam] Other (See Comments)    bloating   Current Meds  Medication Sig   albuterol (VENTOLIN HFA) 108 (90 Base) MCG/ACT inhaler Inhale 2 puffs into the lungs every 6 (six) hours as needed for wheezing or shortness of breath.   clobetasol ointment (TEMOVATE) 0.05 % Apply topically daily as needed.   dicyclomine (BENTYL) 20 MG tablet Take 1 tablet (20 mg total) by mouth 3 (three) times daily before meals.   hydrOXYzine (ATARAX/VISTARIL) 10 MG tablet hydroxyzine HCl 10 mg tablet  TAKE 1 TABLET BY MOUTH EVERYDAY AT BEDTIME   hyoscyamine (LEVSIN) 0.125 MG tablet Take 0.125 mg by mouth as needed.   levonorgestrel (MIRENA) 20 MCG/24HR IUD 1 each by Intrauterine route once.   triamcinolone ointment (KENALOG) 0.1 %  Use 1 application sparingly twice a day as needed to red itchy areas. Do not use on face, neck, groin, or armpit region   Past Medical History:  Diagnosis Date   Asthma    Chronic interstitial cystitis    Complication of anesthesia    hard to wake after anesthesia   Endometriosis    Fatty liver    Headache    Hyperlipidemia    IBS (irritable bowel syndrome)    Ileitis    IUD (intrauterine device) in place    Mirena, Dr. Pennie Rushing   NAFLD (nonalcoholic fatty liver disease)    Other psoriasis    Past Surgical History:  Procedure Laterality Date   CESAREAN SECTION      2008, 2010   CHOLECYSTECTOMY N/A 07/07/2017   Procedure: LAPAROSCOPIC CHOLECYSTECTOMY;  Surgeon: Abigail Miyamoto, MD;  Location: [facility];  Service: General;  Laterality: N/A;   CYSTO WITH HYDRODISTENSION  03/29/2012   Procedure: CYSTOSCOPY/HYDRODISTENSION;  Surgeon: Martina Sinner, MD;  Location: [facility];  Service: Urology;  Laterality: N/A;  with Instillation of Marcaine and Pyridium    ESOPHAGOGASTRODUODENOSCOPY  2018   LAPAROSCOPY  03/29/2012   Procedure: LAPAROSCOPY OPERATIVE;  Surgeon: Hal Morales, MD;  Location: [facility];  Service: Gynecology;  Laterality: N/A;  with Fulguration of Endometriosis and Peritoneal Biopsy   TONSILLECTOMY     TUBAL LIGATION  2010   UMBILICAL HERNIA REPAIR  10/2010   Social History   Social History Narrative   Married    Employed as a Research officer, trade union,  mainly medical   From [place] ; 2 daughters 2008, 2010    Rare alcohol, never smoker no drug use   Right handed   Caffeine: 1 or 2 cups a week   family history includes Allergic rhinitis in her mother; Alzheimer's disease in her father; Diabetes in her paternal uncle; Heart disease in an other family member; Hyperlipidemia in her mother and sister; Migraines in her mother.   Review of Systems As per HPI  Objective:   Physical Exam BP 100/62   Pulse 76   Ht 4' 10.5" (1.486 m)   Wt 136 lb (61.7 kg)   BMI 27.94 kg/m

## 2021-03-25 NOTE — Patient Instructions (Addendum)
Congratulations on your healthy eating.  Have a wonderful trip to [place]!  Due to recent changes in healthcare laws, you may see the results of your imaging and laboratory studies on MyChart before your provider has had a chance to review them.  We understand that in some cases there may be results that are confusing or concerning to you. Not all laboratory results come back in the same time frame and the provider may be waiting for multiple results in order to interpret others.  Please give [place] 48 hours in order for your provider to thoroughly review all the results before contacting the office for clarification of your results.   I appreciate the opportunity to care for you. Stan Head, MD, [facility]

## 2021-04-30 DIAGNOSIS — N644 Mastodynia: Secondary | ICD-10-CM | POA: Diagnosis not present

## 2021-04-30 DIAGNOSIS — Z113 Encounter for screening for infections with a predominantly sexual mode of transmission: Secondary | ICD-10-CM | POA: Diagnosis not present

## 2021-04-30 DIAGNOSIS — N921 Excessive and frequent menstruation with irregular cycle: Secondary | ICD-10-CM | POA: Diagnosis not present

## 2021-05-22 ENCOUNTER — Other Ambulatory Visit: Payer: Self-pay

## 2021-05-22 ENCOUNTER — Ambulatory Visit: Payer: BC Managed Care – PPO | Admitting: Physician Assistant

## 2021-05-22 VITALS — BP 100/68 | HR 86 | Temp 98.1°F | Ht 58.5 in | Wt 138.2 lb

## 2021-05-22 DIAGNOSIS — H6121 Impacted cerumen, right ear: Secondary | ICD-10-CM | POA: Diagnosis not present

## 2021-05-22 DIAGNOSIS — H918X3 Other specified hearing loss, bilateral: Secondary | ICD-10-CM | POA: Diagnosis not present

## 2021-05-22 DIAGNOSIS — J3089 Other allergic rhinitis: Secondary | ICD-10-CM

## 2021-05-22 MED ORDER — FLUTICASONE PROPIONATE 50 MCG/ACT NA SUSP: 2.0000 | Freq: Every day | NASAL | 0 refills | Status: DC

## 2021-05-22 NOTE — Progress Notes (Signed)
Acute Office Visit  Subjective:    Patient ID: Sierra Hodge, female    DOB: 02-01-1976, 45 y.o.   MRN: [id number]  Chief Complaint  Patient presents with   Ear Popping    Decreased hearing x 1 month   Medication Refill   Patient is in today for intermittent ear popping and nasal congestion x several weeks. States last week it felt like she was on an airplane in her ears, but this week is better. No fever or chills. Slight sinus headache. States her hearing feels like it might be diminished. No ringing in ears. No vertigo.   Past Medical History:  Diagnosis Date   Asthma    Chronic interstitial cystitis    Complication of anesthesia    hard to wake after anesthesia   Endometriosis    Fatty liver    Headache    Hyperlipidemia    IBS (irritable bowel syndrome)    Ileitis    IUD (intrauterine device) in place    Mirena, Dr. Pennie Rushing   NAFLD (nonalcoholic fatty liver disease)    Other psoriasis     Past Surgical History:  Procedure Laterality Date   CESAREAN SECTION     2008, 2010   CHOLECYSTECTOMY N/A 07/07/2017   Procedure: LAPAROSCOPIC CHOLECYSTECTOMY;  Surgeon: Abigail Miyamoto, MD;  Location: [facility];  Service: General;  Laterality: N/A;   CYSTO WITH HYDRODISTENSION  03/29/2012   Procedure: CYSTOSCOPY/HYDRODISTENSION;  Surgeon: Martina Sinner, MD;  Location: [facility];  Service: Urology;  Laterality: N/A;  with Instillation of Marcaine and Pyridium    ESOPHAGOGASTRODUODENOSCOPY  2018   LAPAROSCOPY  03/29/2012   Procedure: LAPAROSCOPY OPERATIVE;  Surgeon: Hal Morales, MD;  Location: [facility];  Service: Gynecology;  Laterality: N/A;  with Fulguration of Endometriosis and Peritoneal Biopsy   TONSILLECTOMY     TUBAL LIGATION  2010   UMBILICAL HERNIA REPAIR  10/2010    Family History  Problem Relation Age of Onset   Alzheimer's disease Father    Hyperlipidemia Mother    Migraines Mother    Allergic rhinitis Mother    Heart disease Other         paternal family   Diabetes Paternal Uncle    Hyperlipidemia Sister    Colon cancer Neg Hx    Breast cancer Neg Hx    Esophageal cancer Neg Hx    Rectal cancer Neg Hx    Stomach cancer Neg Hx     Social History   Socioeconomic History   Marital status: Married    Spouse name: Not on file   Number of children: 2   Years of education: 16   Highest education level: Not on file  Occupational History   Occupation: Medical interpreter    Comment: Spanish  Tobacco Use   Smoking status: Never   Smokeless tobacco: Never  Vaping Use   Vaping Use: Never used  Substance and Sexual Activity   Alcohol use: Yes    Comment: rarely    Drug use: No   Sexual activity: Yes    Partners: Male    Birth control/protection: I.U.D., Surgical    Comment: BTL   Other Topics Concern   Not on file  Social History Narrative   Married    Employed as a Research officer, trade union,  mainly medical   From [place] ; 2 daughters 2008, 2010    Rare alcohol, never smoker no drug use   Right handed   Caffeine: 1 or  2 cups a week   Social Determinants of Health   Financial Resource Strain: Not on file  Food Insecurity: Not on file  Transportation Needs: Not on file  Physical Activity: Not on file  Stress: Not on file  Social Connections: Not on file  Intimate Partner Violence: Not on file    Outpatient Medications Prior to Visit  Medication Sig Dispense Refill   albuterol (VENTOLIN HFA) 108 (90 Base) MCG/ACT inhaler Inhale 2 puffs into the lungs every 6 (six) hours as needed for wheezing or shortness of breath. 18 g 3   clobetasol ointment (TEMOVATE) 0.05 % Apply topically daily as needed. 60 g 5   dicyclomine (BENTYL) 20 MG tablet Take 1 tablet (20 mg total) by mouth 3 (three) times daily before meals. 270 tablet 1   hydrOXYzine (ATARAX/VISTARIL) 10 MG tablet hydroxyzine HCl 10 mg tablet  TAKE 1 TABLET BY MOUTH EVERYDAY AT BEDTIME     hyoscyamine (LEVSIN) 0.125 MG tablet Take 0.125 mg by mouth as  needed.     levonorgestrel (MIRENA) 20 MCG/24HR IUD 1 each by Intrauterine route once.     triamcinolone ointment (KENALOG) 0.1 % Use 1 application sparingly twice a day as needed to red itchy areas. Do not use on face, neck, groin, or armpit region 15 g 0   Facility-Administered Medications Prior to Visit  Medication Dose Route Frequency Provider Last Rate Last Admin   gadopentetate dimeglumine (MAGNEVIST) injection 13 mL  13 mL Intravenous Once PRN Anson Fret, MD        Allergies  Allergen Reactions   Morphine And Related Itching   Mobic [Meloxicam] Other (See Comments)    bloating    Review of Systems REFER TO HPI FOR PERTINENT POSITIVES AND NEGATIVES     Objective:    Physical Exam Vitals and nursing note reviewed.  Constitutional:      Appearance: Normal appearance.  HENT:     Right Ear: Tympanic membrane and external ear normal. There is impacted cerumen.     Left Ear: Tympanic membrane and external ear normal. There is no impacted cerumen.     Nose: Congestion present.  Eyes:     Extraocular Movements: Extraocular movements intact.     Conjunctiva/sclera: Conjunctivae normal.     Pupils: Pupils are equal, round, and reactive to light.  Cardiovascular:     Rate and Rhythm: Normal rate and regular rhythm.     Pulses: Normal pulses.     Heart sounds: Normal heart sounds.  Pulmonary:     Effort: Pulmonary effort is normal.     Breath sounds: Normal breath sounds.  Neurological:     Mental Status: She is alert.    BP 100/68   Pulse 86   Temp 98.1 F (36.7 C)   Ht 4' 10.5" (1.486 m)   Wt 138 lb 3.2 oz (62.7 kg)   SpO2 100%   BMI 28.39 kg/m  Wt Readings from Last 3 Encounters:  05/22/21 138 lb 3.2 oz (62.7 kg)  03/25/21 136 lb (61.7 kg)  03/03/21 138 lb (62.6 kg)    Health Maintenance Due  Topic Date Due   INFLUENZA VACCINE  04/20/2021    There are no preventive care reminders to display for this patient.   Lab Results  Component Value Date    TSH 1.58 07/11/2020   Lab Results  Component Value Date   WBC 8.4 11/10/2020   HGB 13.6 11/10/2020   HCT 39.9 11/10/2020   MCV 86.0  11/10/2020   PLT 258.0 11/10/2020   Lab Results  Component Value Date   NA 136 11/10/2020   K 3.6 11/10/2020   CO2 26 11/10/2020   GLUCOSE 140 (H) 11/10/2020   BUN 12 11/10/2020   CREATININE 0.69 11/10/2020   BILITOT 0.9 11/10/2020   ALKPHOS 46 11/10/2020   AST 19 11/10/2020   ALT 23 11/10/2020   PROT 7.1 11/10/2020   ALBUMIN 4.3 11/10/2020   CALCIUM 9.7 11/10/2020   GFR 104.25 11/10/2020   Lab Results  Component Value Date   CHOL 200 (H) 07/11/2020   Lab Results  Component Value Date   HDL 46 (L) 07/11/2020   Lab Results  Component Value Date   LDLCALC 136 (H) 07/11/2020   Lab Results  Component Value Date   TRIG 82 07/11/2020   Lab Results  Component Value Date   CHOLHDL 4.3 07/11/2020   No results found for: HGBA1C     Assessment & Plan:   Problem List Items Addressed This Visit   None Visit Diagnoses     Impacted cerumen of right ear    -  Primary   Non-seasonal allergic rhinitis, unspecified trigger           1. Impacted cerumen of right ear Ceruminosis is noted. Removal procedure explained and verbal consent obtained from patient. Wax is removed by syringing and manual debridement from right ear. Pt tolerated procedure well. Instructions for home care to prevent wax buildup are given.  2. Non-seasonal allergic rhinitis, unspecified trigger Recommended use of Flonase daily in addition to her regular OTC allergy medication of choice po.  3. Other specified hearing loss of both ears She failed audiology testing in exam room s/p cerumen removal. Referral to ENT.   Calyn Sivils M Orlyn Odonoghue, PA-C

## 2021-05-22 NOTE — Patient Instructions (Addendum)
Good to see you today. We were able to clear wax out of your right ear canal.  Your hearing test was abnormal and a referral to ENT has been sent. Please continue to use your daily allergy medication and add Flonase to added relief. Try DEBROX drops over the counter to help keep wax level down.

## 2021-06-08 DIAGNOSIS — M79629 Pain in unspecified upper arm: Secondary | ICD-10-CM | POA: Diagnosis not present

## 2021-06-08 DIAGNOSIS — N644 Mastodynia: Secondary | ICD-10-CM | POA: Diagnosis not present

## 2021-06-08 DIAGNOSIS — R9230 Dense breasts, unspecified: Secondary | ICD-10-CM | POA: Insufficient documentation

## 2021-06-08 LAB — HM MAMMOGRAPHY

## 2021-06-09 ENCOUNTER — Other Ambulatory Visit: Payer: Self-pay

## 2021-06-09 MED ORDER — ALBUTEROL SULFATE HFA 108 (90 BASE) MCG/ACT IN AERS: 2.0000 | INHALATION_SPRAY | Freq: Four times a day (QID) | RESPIRATORY_TRACT | 1 refills | Status: DC | PRN

## 2021-06-23 ENCOUNTER — Encounter: Payer: Self-pay | Admitting: Internal Medicine

## 2021-06-23 ENCOUNTER — Ambulatory Visit: Payer: BC Managed Care – PPO | Admitting: Internal Medicine

## 2021-06-23 VITALS — BP 90/60 | HR 70 | Ht <= 58 in | Wt 140.0 lb

## 2021-06-23 DIAGNOSIS — N809 Endometriosis, unspecified: Secondary | ICD-10-CM

## 2021-06-23 DIAGNOSIS — K589 Irritable bowel syndrome without diarrhea: Secondary | ICD-10-CM | POA: Diagnosis not present

## 2021-06-23 DIAGNOSIS — N3010 Interstitial cystitis (chronic) without hematuria: Secondary | ICD-10-CM | POA: Diagnosis not present

## 2021-06-23 DIAGNOSIS — R1031 Right lower quadrant pain: Secondary | ICD-10-CM

## 2021-06-23 DIAGNOSIS — G8929 Other chronic pain: Secondary | ICD-10-CM

## 2021-06-23 NOTE — Patient Instructions (Signed)
Please try over the counter pepcid 20mg  (famotidine) : Take one-two pills as needed.   We are giving you GERD information to read.   Keep a food diary.   Re-try your dicyclomine.   Eat real food.   Call early November for a January appointment.   I appreciate the opportunity to care for you. February, MD, [facility]

## 2021-06-23 NOTE — Progress Notes (Signed)
Iridian Reader 45 y.o. 1975/11/04 [id number]  Assessment & Plan:   Encounter Diagnoses  Name Primary?   Irritable bowel syndrome Yes   Endometriosis    Interstitial cystitis    Chronic RLQ pain     We reviewed GERD diet again and she is encouraged to avoid GERD trigger foods Try OTC Pepcid as needed Retry dicyclomine, question are her problems IBS or endometriosis or interstitial cystitis its not clear or could be a mix Keep a food diary and symptom log Keep GYN follow-up Reminded her that I think that the ileitis seen a colonoscopy is very unlikely to be IBD Return to see me in early 2023  Copy to Dr. Hoover Browns Subjective:   Chief Complaint: IBS follow-up  HPI The patient is a 45 year old woman with a history of IBS and GERD symptoms and chronic right lower quadrant pain as well as nonalcoholic fatty liver disease who is here for follow-up.  I had seen her in July she went on a trip to [place] that went quite well and she was feeling well and stopped dicyclomine that she had been taking regularly.  Recall that she had a remote history of a nonspecific ileitis and a negative calprotectin test.  She remains concerned about that ileitis but I have thought that that was really nonspecific.  She has noted a cyclical pattern over 2 months with her right lower quadrant pain and periumbilical pain.  Her IC symptoms are under control.  She has seen her gynecologist and there was a question of whether or not her endometriosis was coming back.  She has a Minerva IUD that is supposed to help that.  She is monitoring the symptoms and has a follow-up in December with her gynecologist.  Wt Readings from Last 3 Encounters:  06/23/21 140 lb (63.5 kg)  05/22/21 138 lb 3.2 oz (62.7 kg)  03/25/21 136 lb (61.7 kg)     Allergies  Allergen Reactions   Morphine And Related Itching   Mobic [Meloxicam] Other (See Comments)    bloating   Current Meds  Medication Sig   albuterol  (VENTOLIN HFA) 108 (90 Base) MCG/ACT inhaler Inhale 2 puffs into the lungs every 6 (six) hours as needed for wheezing or shortness of breath.   clobetasol ointment (TEMOVATE) 0.05 % Apply topically daily as needed.   dicyclomine (BENTYL) 20 MG tablet Take 1 tablet (20 mg total) by mouth 3 (three) times daily before meals.   hydrOXYzine (ATARAX/VISTARIL) 10 MG tablet hydroxyzine HCl 10 mg tablet  TAKE 1 TABLET BY MOUTH EVERYDAY AT BEDTIME   levonorgestrel (MIRENA) 20 MCG/24HR IUD 1 each by Intrauterine route once.   montelukast (SINGULAIR) 10 MG tablet Take 10 mg by mouth daily.   triamcinolone ointment (KENALOG) 0.1 % Use 1 application sparingly twice a day as needed to red itchy areas. Do not use on face, neck, groin, or armpit region   Past Medical History:  Diagnosis Date   Asthma    Chronic interstitial cystitis    Complication of anesthesia    hard to wake after anesthesia   Endometriosis    Fatty liver    Headache    Hyperlipidemia    IBS (irritable bowel syndrome)    Ileitis    IUD (intrauterine device) in place    Mirena, Dr. Pennie Rushing   NAFLD (nonalcoholic fatty liver disease)    Other psoriasis    Past Surgical History:  Procedure Laterality Date   CESAREAN SECTION  2008, 2010   CHOLECYSTECTOMY N/A 07/07/2017   Procedure: LAPAROSCOPIC CHOLECYSTECTOMY;  Surgeon: Abigail Miyamoto, MD;  Location: [facility];  Service: General;  Laterality: N/A;   CYSTO WITH HYDRODISTENSION  03/29/2012   Procedure: CYSTOSCOPY/HYDRODISTENSION;  Surgeon: Martina Sinner, MD;  Location: [facility];  Service: Urology;  Laterality: N/A;  with Instillation of Marcaine and Pyridium    ESOPHAGOGASTRODUODENOSCOPY  2018   LAPAROSCOPY  03/29/2012   Procedure: LAPAROSCOPY OPERATIVE;  Surgeon: Hal Morales, MD;  Location: [facility];  Service: Gynecology;  Laterality: N/A;  with Fulguration of Endometriosis and Peritoneal Biopsy   TONSILLECTOMY     TUBAL LIGATION  2010   UMBILICAL  HERNIA REPAIR  10/2010   Social History   Social History Narrative   Married    Employed as a Research officer, trade union,  mainly medical   From [place] ; 2 daughters 2008, 2010    Rare alcohol, never smoker no drug use   Right handed   Caffeine: 1 or 2 cups a week   family history includes Allergic rhinitis in her mother; Alzheimer's disease in her father; Diabetes in her paternal uncle; Heart disease in an other family member; Hyperlipidemia in her mother and sister; Migraines in her mother.   Review of Systems  As per HPI Objective:   Physical Exam BP 90/60   Pulse 70   Ht 4\' 10"  (1.473 m)   Wt 140 lb (63.5 kg)   BMI 29.26 kg/m  Petite WDWN  Abd soft mild RLQ tenderess w/ deep palp - neg hip changes

## 2021-07-14 DIAGNOSIS — J343 Hypertrophy of nasal turbinates: Secondary | ICD-10-CM | POA: Diagnosis not present

## 2021-07-14 DIAGNOSIS — J310 Chronic rhinitis: Secondary | ICD-10-CM | POA: Diagnosis not present

## 2021-07-14 DIAGNOSIS — H93293 Other abnormal auditory perceptions, bilateral: Secondary | ICD-10-CM | POA: Diagnosis not present

## 2021-07-14 DIAGNOSIS — H6983 Other specified disorders of Eustachian tube, bilateral: Secondary | ICD-10-CM | POA: Diagnosis not present

## 2021-07-17 ENCOUNTER — Other Ambulatory Visit: Payer: Self-pay

## 2021-07-17 ENCOUNTER — Encounter: Payer: Self-pay | Admitting: Physician Assistant

## 2021-07-17 ENCOUNTER — Encounter: Payer: BC Managed Care – PPO | Admitting: Family Medicine

## 2021-07-17 ENCOUNTER — Ambulatory Visit: Payer: BC Managed Care – PPO | Admitting: Physician Assistant

## 2021-07-17 VITALS — BP 110/72 | HR 78 | Temp 98.3°F | Ht <= 58 in | Wt 139.2 lb

## 2021-07-17 DIAGNOSIS — Z23 Encounter for immunization: Secondary | ICD-10-CM

## 2021-07-17 DIAGNOSIS — Z131 Encounter for screening for diabetes mellitus: Secondary | ICD-10-CM

## 2021-07-17 DIAGNOSIS — Z0000 Encounter for general adult medical examination without abnormal findings: Secondary | ICD-10-CM | POA: Diagnosis not present

## 2021-07-17 DIAGNOSIS — E7800 Pure hypercholesterolemia, unspecified: Secondary | ICD-10-CM

## 2021-07-17 DIAGNOSIS — E559 Vitamin D deficiency, unspecified: Secondary | ICD-10-CM

## 2021-07-17 DIAGNOSIS — J00 Acute nasopharyngitis [common cold]: Secondary | ICD-10-CM

## 2021-07-17 DIAGNOSIS — R5383 Other fatigue: Secondary | ICD-10-CM | POA: Diagnosis not present

## 2021-07-17 NOTE — Progress Notes (Signed)
Subjective:    Patient ID: Sierra Hodge, female    DOB: April 07, 1976, 45 y.o.   MRN: [id number]  Chief Complaint  Patient presents with   Annual Exam    HPI Patient is in today for annual exam.  Acute concerns: Feels congested & fatigued that started in the last 24 hours; feels like she is developing a cold  Health maintenance: Lifestyle/ exercise: Walks 20 mins at least daily and yoga  Nutrition: Spinach omelette, whole wheat; tries to limit gluten otherwise has some lower abdominal pain; chicken, rice or beans, fish, cooks often Mental health: No concerns  Caffeine: 1-2 cups coffee daily  Sleep: Usually sleeps pretty good  Substance use: None  Sexual activity: No issues  Immunizations: Flu vaccine today  Colonoscopy: UTD, next due 12/27/30  Pap: Sees GYN, UTD  Mammogram: Scheduled through GYN, UTD   Past Medical History:  Diagnosis Date   Asthma    Chronic interstitial cystitis    Complication of anesthesia    hard to wake after anesthesia   Endometriosis    Fatty liver    Headache    Hyperlipidemia    IBS (irritable bowel syndrome)    Ileitis    IUD (intrauterine device) in place    Mirena, Dr. Pennie Rushing   NAFLD (nonalcoholic fatty liver disease)    Other psoriasis     Past Surgical History:  Procedure Laterality Date   CESAREAN SECTION     2008, 2010   CHOLECYSTECTOMY N/A 07/07/2017   Procedure: LAPAROSCOPIC CHOLECYSTECTOMY;  Surgeon: Abigail Miyamoto, MD;  Location: [facility];  Service: General;  Laterality: N/A;   CYSTO WITH HYDRODISTENSION  03/29/2012   Procedure: CYSTOSCOPY/HYDRODISTENSION;  Surgeon: Martina Sinner, MD;  Location: [facility];  Service: Urology;  Laterality: N/A;  with Instillation of Marcaine and Pyridium    ESOPHAGOGASTRODUODENOSCOPY  2018   LAPAROSCOPY  03/29/2012   Procedure: LAPAROSCOPY OPERATIVE;  Surgeon: Hal Morales, MD;  Location: [facility];  Service: Gynecology;  Laterality: N/A;  with Fulguration of  Endometriosis and Peritoneal Biopsy   TONSILLECTOMY     TUBAL LIGATION  2010   UMBILICAL HERNIA REPAIR  10/2010    Family History  Problem Relation Age of Onset   Alzheimer's disease Father    Hyperlipidemia Mother    Migraines Mother    Allergic rhinitis Mother    Heart disease Other        paternal family   Diabetes Paternal Uncle    Hyperlipidemia Sister    Colon cancer Neg Hx    Breast cancer Neg Hx    Esophageal cancer Neg Hx    Rectal cancer Neg Hx    Stomach cancer Neg Hx     Social History   Tobacco Use   Smoking status: Never   Smokeless tobacco: Never  Vaping Use   Vaping Use: Never used  Substance Use Topics   Alcohol use: Yes    Comment: rarely    Drug use: No     Allergies  Allergen Reactions   Morphine And Related Itching   Mobic [Meloxicam] Other (See Comments)    bloating    Review of Systems  Constitutional:  Negative for activity change, appetite change, fever and unexpected weight change.  HENT:  Positive for congestion and sneezing.   Eyes:  Negative for visual disturbance.  Respiratory:  Positive for cough. Negative for apnea and shortness of breath.   Cardiovascular:  Negative for chest pain, palpitations and leg swelling.  Gastrointestinal:  Negative for abdominal pain, blood in stool, constipation and diarrhea.  Endocrine: Negative for polydipsia, polyphagia and polyuria.  Genitourinary:  Negative for dysuria and pelvic pain.  Musculoskeletal:  Negative for arthralgias.  Skin:  Negative for rash.  Neurological:  Negative for dizziness, weakness and headaches.  Hematological:  Negative for adenopathy. Does not bruise/bleed easily.  Psychiatric/Behavioral:  Negative for sleep disturbance and suicidal ideas. The patient is not nervous/anxious.        Objective:     BP 110/72   Pulse 78   Temp 98.3 F (36.8 C)   Ht 4\' 10"  (1.473 m)   Wt 139 lb 3.2 oz (63.1 kg)   SpO2 97%   BMI 29.09 kg/m   Wt Readings from Last 3 Encounters:   07/17/21 139 lb 3.2 oz (63.1 kg)  06/23/21 140 lb (63.5 kg)  05/22/21 138 lb 3.2 oz (62.7 kg)    BP Readings from Last 3 Encounters:  07/17/21 110/72  06/23/21 90/60  05/22/21 100/68     Physical Exam Vitals and nursing note reviewed.  Constitutional:      Appearance: Normal appearance. She is normal weight. She is not toxic-appearing.  HENT:     Head: Normocephalic and atraumatic.     Right Ear: Tympanic membrane, ear canal and external ear normal.     Left Ear: Tympanic membrane, ear canal and external ear normal.     Nose: Congestion present.     Mouth/Throat:     Mouth: Mucous membranes are moist.  Eyes:     Extraocular Movements: Extraocular movements intact.     Conjunctiva/sclera: Conjunctivae normal.     Pupils: Pupils are equal, round, and reactive to light.  Cardiovascular:     Rate and Rhythm: Normal rate and regular rhythm.     Pulses: Normal pulses.     Heart sounds: Normal heart sounds.  Pulmonary:     Effort: Pulmonary effort is normal.     Breath sounds: Normal breath sounds.  Abdominal:     General: Abdomen is flat. Bowel sounds are normal.     Palpations: Abdomen is soft.  Musculoskeletal:        General: Normal range of motion.     Cervical back: Normal range of motion and neck supple.  Skin:    General: Skin is warm and dry.  Neurological:     General: No focal deficit present.     Mental Status: She is alert and oriented to person, place, and time.  Psychiatric:        Mood and Affect: Mood normal.        Behavior: Behavior normal.        Thought Content: Thought content normal.        Judgment: Judgment normal.       Assessment & Plan:   Problem List Items Addressed This Visit   None Visit Diagnoses     Annual physical exam    -  Primary   Relevant Orders   CBC with Differential/Platelet   Comprehensive metabolic panel   TSH   Lipid panel   VITAMIN D 25 Hydroxy (Vit-D Deficiency, Fractures)   Diabetes mellitus screening        Relevant Orders   Comprehensive metabolic panel   High cholesterol       Relevant Orders   Lipid panel   Other fatigue       Relevant Orders   TSH   Vitamin D deficiency  Relevant Orders   VITAMIN D 25 Hydroxy (Vit-D Deficiency, Fractures)   Need for immunization against influenza       Relevant Orders   Flu Vaccine QUAD 75mo+IM (Fluarix, Fluzone & Alfiuria Quad PF) (Completed)   Acute nasopharyngitis (common cold)           Age-appropriate screening and counseling performed today. Will check labs and call with results. Preventive measures discussed and printed in AVS for patient. Flu shot today. She is UTD with preventive measures and sees GYN. She likely has a common cold and I advised supportive care such as nasal saline, humidifier, OTC Delsym. F/up 1 year for CPE. Recheck prn if any acute concerns arise.  Rebecah Dangerfield M Patric Buckhalter, PA-C

## 2021-07-24 ENCOUNTER — Other Ambulatory Visit: Payer: Self-pay

## 2021-07-24 ENCOUNTER — Other Ambulatory Visit (INDEPENDENT_AMBULATORY_CARE_PROVIDER_SITE_OTHER): Payer: BC Managed Care – PPO

## 2021-07-24 ENCOUNTER — Telehealth: Payer: Self-pay

## 2021-07-24 DIAGNOSIS — E7800 Pure hypercholesterolemia, unspecified: Secondary | ICD-10-CM | POA: Diagnosis not present

## 2021-07-24 DIAGNOSIS — Z131 Encounter for screening for diabetes mellitus: Secondary | ICD-10-CM

## 2021-07-24 DIAGNOSIS — E559 Vitamin D deficiency, unspecified: Secondary | ICD-10-CM | POA: Diagnosis not present

## 2021-07-24 DIAGNOSIS — R5383 Other fatigue: Secondary | ICD-10-CM | POA: Diagnosis not present

## 2021-07-24 DIAGNOSIS — Z0000 Encounter for general adult medical examination without abnormal findings: Secondary | ICD-10-CM

## 2021-07-24 LAB — CBC WITH DIFFERENTIAL/PLATELET
Basophils Absolute: 0.1 10*3/uL (ref 0.0–0.1)
Basophils Relative: 0.6 % (ref 0.0–3.0)
Eosinophils Absolute: 0.2 10*3/uL (ref 0.0–0.7)
Eosinophils Relative: 1.5 % (ref 0.0–5.0)
HCT: 41.1 % (ref 36.0–46.0)
Hemoglobin: 13.9 g/dL (ref 12.0–15.0)
Lymphocytes Relative: 23.2 % (ref 12.0–46.0)
Lymphs Abs: 2.3 10*3/uL (ref 0.7–4.0)
MCHC: 33.7 g/dL (ref 30.0–36.0)
MCV: 87.7 fl (ref 78.0–100.0)
Monocytes Absolute: 0.6 10*3/uL (ref 0.1–1.0)
Monocytes Relative: 6.1 % (ref 3.0–12.0)
Neutro Abs: 6.8 10*3/uL (ref 1.4–7.7)
Neutrophils Relative %: 68.6 % (ref 43.0–77.0)
Platelets: 246 10*3/uL (ref 150.0–400.0)
RBC: 4.69 Mil/uL (ref 3.87–5.11)
RDW: 13.4 % (ref 11.5–15.5)
WBC: 10 10*3/uL (ref 4.0–10.5)

## 2021-07-24 LAB — LIPID PANEL
Cholesterol: 231 mg/dL — ABNORMAL HIGH (ref 0–200)
HDL: 51.8 mg/dL (ref >39.00)
LDL Cholesterol: 161 mg/dL — ABNORMAL HIGH (ref 0–99)
NonHDL: 178.9
Total CHOL/HDL Ratio: 4
Triglycerides: 92 mg/dL (ref 0.0–149.0)
VLDL: 18.4 mg/dL (ref 0.0–40.0)

## 2021-07-24 LAB — COMPREHENSIVE METABOLIC PANEL
ALT: 22 U/L (ref 0–35)
AST: 20 U/L (ref 0–37)
Albumin: 4.3 g/dL (ref 3.5–5.2)
Alkaline Phosphatase: 51 U/L (ref 39–117)
BUN: 12 mg/dL (ref 6–23)
CO2: 30 mEq/L (ref 19–32)
Calcium: 9.3 mg/dL (ref 8.4–10.5)
Chloride: 102 mEq/L (ref 96–112)
Creatinine, Ser: 0.61 mg/dL (ref 0.40–1.20)
GFR: 106.86 mL/min (ref >60.00)
Glucose, Bld: 94 mg/dL (ref 70–99)
Potassium: 4.3 mEq/L (ref 3.5–5.1)
Sodium: 138 mEq/L (ref 135–145)
Total Bilirubin: 0.7 mg/dL (ref 0.2–1.2)
Total Protein: 7.1 g/dL (ref 6.0–8.3)

## 2021-07-24 LAB — TSH: TSH: 1.8 u[IU]/mL (ref 0.35–5.50)

## 2021-07-24 LAB — VITAMIN D 25 HYDROXY (VIT D DEFICIENCY, FRACTURES): VITD: 48.3 ng/mL (ref 30.00–100.00)

## 2021-07-24 MED ORDER — CLOBETASOL PROPIONATE 0.05 % EX OINT: TOPICAL_OINTMENT | Freq: Every day | CUTANEOUS | 5 refills | Status: DC | PRN

## 2021-07-24 NOTE — Telephone Encounter (Signed)
Rx sent in

## 2021-07-24 NOTE — Telephone Encounter (Signed)
  Encourage patient to contact the pharmacy for refills or they can request refills through [facility]  LAST APPOINTMENT DATE:  07/17/2021  NEXT APPOINTMENT DATE: 07/16/2022  MEDICATION: clobetasol ointment (TEMOVATE) 0.05 %  Is the patient out of medication? yes  PHARMACY: [id number] [place], [place] - [address]  Let patient know to contact pharmacy at the end of the day to make sure medication is ready.  Please notify patient to allow 48-72 hours to process

## 2021-08-24 ENCOUNTER — Telehealth: Payer: Self-pay | Admitting: Internal Medicine

## 2021-08-24 NOTE — Telephone Encounter (Signed)
Left message for pt to call back  °

## 2021-08-24 NOTE — Telephone Encounter (Signed)
Patient called and states that she has a lot of pressure in her chest. And she is taking her Reflux medication, and is seeking advise.

## 2021-08-24 NOTE — Telephone Encounter (Signed)
Pt stated that she had a rapid HR and chest pressure Saturday and Sunday after drinking coffee. . Pt stated that she did not have the symptoms today when she drank the coffee where as she stated that she drank less and added Milk. Pt encouraged to see her PCP in regard to the Rapid HR and Chest pressure after drinking the coffee for an evaluation. Pt questioned a medication for reflex. After reviewing pt chart it was noted that Dr. Leone Payor had advised  that pt try Pepcid OTC. Pt stated that she has not been taking that. Pt stated that she has been taking  the dicyclomine. Pt was notified that the dicyclomine was prescribed for her IBS. Pt verbalized understanding with all questions answered.

## 2021-08-31 DIAGNOSIS — N803 Endometriosis of pelvic peritoneum, unspecified: Secondary | ICD-10-CM | POA: Diagnosis not present

## 2021-08-31 DIAGNOSIS — M545 Low back pain, unspecified: Secondary | ICD-10-CM | POA: Diagnosis not present

## 2021-08-31 DIAGNOSIS — R300 Dysuria: Secondary | ICD-10-CM | POA: Diagnosis not present

## 2021-08-31 DIAGNOSIS — R102 Pelvic and perineal pain: Secondary | ICD-10-CM | POA: Diagnosis not present

## 2021-09-07 DIAGNOSIS — R102 Pelvic and perineal pain: Secondary | ICD-10-CM | POA: Diagnosis not present

## 2021-09-08 DIAGNOSIS — N803 Endometriosis of pelvic peritoneum, unspecified: Secondary | ICD-10-CM | POA: Diagnosis not present

## 2021-09-08 DIAGNOSIS — R102 Pelvic and perineal pain: Secondary | ICD-10-CM | POA: Diagnosis not present

## 2021-09-08 DIAGNOSIS — N943 Premenstrual tension syndrome: Secondary | ICD-10-CM | POA: Diagnosis not present

## 2021-10-07 ENCOUNTER — Encounter: Payer: Self-pay | Admitting: Internal Medicine

## 2021-10-07 ENCOUNTER — Ambulatory Visit: Payer: BC Managed Care – PPO | Admitting: Internal Medicine

## 2021-10-07 VITALS — BP 104/76 | HR 76 | Ht <= 58 in | Wt 139.0 lb

## 2021-10-07 DIAGNOSIS — R12 Heartburn: Secondary | ICD-10-CM

## 2021-10-07 DIAGNOSIS — G8929 Other chronic pain: Secondary | ICD-10-CM

## 2021-10-07 DIAGNOSIS — R1031 Right lower quadrant pain: Secondary | ICD-10-CM

## 2021-10-07 DIAGNOSIS — N3010 Interstitial cystitis (chronic) without hematuria: Secondary | ICD-10-CM | POA: Diagnosis not present

## 2021-10-07 DIAGNOSIS — K581 Irritable bowel syndrome with constipation: Secondary | ICD-10-CM

## 2021-10-07 DIAGNOSIS — M545 Low back pain, unspecified: Secondary | ICD-10-CM | POA: Diagnosis not present

## 2021-10-07 DIAGNOSIS — N809 Endometriosis, unspecified: Secondary | ICD-10-CM

## 2021-10-07 NOTE — Patient Instructions (Signed)
Good luck with your Dr Logan Bores appointment 10/14/2021.  Please MyChart message [place] after that visit.   Stop your dicyclomine per Dr Leone Payor.  If you are age 48 or older, your body mass index should be between 23-30. Your Body mass index is 29.05 kg/m. If this is out of the aforementioned range listed, please consider follow up with your Primary Care Provider.  If you are age 21 or younger, your body mass index should be between 19-25. Your Body mass index is 29.05 kg/m. If this is out of the aformentioned range listed, please consider follow up with your Primary Care Provider.   ________________________________________________________  The [facility] providers would like to encourage you to use [facility] to communicate with providers for non-urgent requests or questions.  Due to long hold times on the telephone, sending your provider a message by [facility] may be a faster and more efficient way to get a response.  Please allow 48 business hours for a response.  Please remember that this is for non-urgent requests.  _______________________________________________________    I appreciate the opportunity to care for you. Stan Head, MD, [facility]

## 2021-10-07 NOTE — Progress Notes (Addendum)
Sierra Hodge 46 y.o. 1975-10-23 [id number]  Assessment & Plan:   Encounter Diagnoses  Name Primary?   Chronic RLQ pain Yes   Heartburn    Chronic bilateral low back pain without sciatica    Interstitial cystitis    Endometriosis    Irritable bowel syndrome with constipation    I am not sure what is causing her pain.  Endometriosis, interstitial cystitis are still in the differential.  She had terminal ileal erosions but the rest of the work-up looking for the possibility of IBD was negative (serology, calprotectin).  She does not have much in the way of bowel symptoms at this time so I do not think this is irritable bowel syndrome but we have not ruled out a GI cause admittedly.  Heartburn responds well to low-dose H2 blocker before meals we will continue that  Dicyclomine is not helping discontinue that  GERD diet is recommended and provided  Will await GU evaluation next.  Consider repeating a CT scan.  She had this retroperitoneal possible inflammation on a CT urogram about a year ago.  She was having a lot more back pain then so I am not sure about the utility of checking an MRI but that is a possibility of checking a spine MRI in the lumbar region.  May defer that to primary care.  Another thought would be to do pelvic x-rays looking for sacroiliitis.   Patient was seen in urology on January 25.  I have reviewed the note.  Their thoughts were and it sounds very plausible that her high tone pelvic floor dysfunction was bothering her.  She was going to try baclofen as needed.  They were ordering a renal ultrasound and KUB as well.  They were both negative.  Specific note I would say no bony abnormalities other than mild polyarticular degenerative changes no mention of sacroiliitis in the x-ray.  She should continue with this treatment plan and see me as needed  10/22/2021   Subjective:   Chief Complaint: Abdominal pain  HPI 46 year old woman with a history of IBS and  reflux symptoms as well as chronic right lower quadrant pain and NAFLD.  She returns after a visit in October with persistent abdominal pain mainly in the right lower quadrant.  Bowels move fairly regularly though sometimes she still feels a little constipated.  She has been struggling with a lot of pain she went to gynecology with her history of endometriosis and she tells me that "there is nothing they can do for me".  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  She does seem to have cyclical flares of this pain even though she is not menstruating on the [place].  Pelvic ultrasound was normal and IUD was in the correct position.  She tells me that she took Minerva or had one for 5 years and did very well and this is her second treatment with that and she has had more pain and problems than she did the first time.  Note that on her December visit to gynecology she reported she was feeling better regarding pelvic pain depression and dysuria.  She also talks about how she had terrible back pain at the time of her CT urogram in March 2021 with left-sided buttock and thigh radiation.  She continues to have intermittent back pain bilateral that fluctuates but not disabling like that was.  At the time that CT scan showed some retroperitoneal inflammatory changes (possible) in front  of the lumbar spine.  Previous GI work-up as below regarding endoscopies.  My working diagnosis had been irritable bowel syndrome with constipation.  She had some terminal ileal erosions but a negative calprotectin and normal IBD serology profile.  She has been having heartburn issues and found that 10 mg of Pepcid prior to meals was quite helpful and is continued that.  Wt Readings from Last 3 Encounters:  10/07/21 139 lb (63 kg)  07/17/21 139 lb 3.2 oz (63.1 kg)  06/23/21 140 lb (63.5 kg)   Colonoscopy December 26, 2020 terminal ileal erosions and diverticulosis EGD 2018  normal Allergies  Allergen Reactions   Morphine And Related Itching   Mobic [Meloxicam] Other (See Comments)    bloating   Current Meds  Medication Sig   albuterol (VENTOLIN HFA) 108 (90 Base) MCG/ACT inhaler Inhale 2 puffs into the lungs every 6 (six) hours as needed for wheezing or shortness of breath.   clobetasol ointment (TEMOVATE) 0.05 % Apply topically daily as needed.   dicyclomine (BENTYL) 20 MG tablet Take 1 tablet (20 mg total) by mouth 3 (three) times daily before meals. (Patient taking differently: Take 20 mg by mouth as needed.)   famotidine (PEPCID) 10 MG tablet Take 10 mg by mouth 3 (three) times daily before meals.   hydrOXYzine (ATARAX/VISTARIL) 10 MG tablet hydroxyzine HCl 10 mg tablet  TAKE 1 TABLET BY MOUTH EVERYDAY AT BEDTIME   levonorgestrel (MIRENA) 20 MCG/24HR IUD 1 each by Intrauterine route once.   montelukast (SINGULAIR) 10 MG tablet Take 10 mg by mouth daily.   triamcinolone ointment (KENALOG) 0.1 % Use 1 application sparingly twice a day as needed to red itchy areas. Do not use on face, neck, groin, or armpit region   [DISCONTINUED] famotidine (PEPCID) 20 MG tablet Take 20 mg by mouth 2 (two) times daily.   Past Medical History:  Diagnosis Date   Asthma    Chronic interstitial cystitis    Complication of anesthesia    hard to wake after anesthesia   Endometriosis    Fatty liver    Headache    Hyperlipidemia    IBS (irritable bowel syndrome)    Ileitis    IUD (intrauterine device) in place    Mirena, Dr. Leo Grosser   NAFLD (nonalcoholic fatty liver disease)    Other psoriasis    Past Surgical History:  Procedure Laterality Date   CESAREAN SECTION     2008, 2010   CHOLECYSTECTOMY N/A 07/07/2017   Procedure: LAPAROSCOPIC CHOLECYSTECTOMY;  Surgeon: Coralie Keens, MD;  Location: [facility];  Service: General;  Laterality: N/A;   CYSTO WITH HYDRODISTENSION  03/29/2012   Procedure: CYSTOSCOPY/HYDRODISTENSION;  Surgeon: Reece Packer, MD;  Location: [facility];  Service: Urology;  Laterality: N/A;  with Instillation of Marcaine and Pyridium    ESOPHAGOGASTRODUODENOSCOPY  2018   LAPAROSCOPY  03/29/2012   Procedure: LAPAROSCOPY OPERATIVE;  Surgeon: Eldred Manges, MD;  Location: [facility];  Service: Gynecology;  Laterality: N/A;  with Fulguration of Endometriosis and Peritoneal Biopsy   TONSILLECTOMY     TUBAL LIGATION  [id number]   UMBILICAL HERNIA REPAIR  10/2010   Social History   Social History Narrative   Married    Employed as a Administrator, sports,  mainly medical   From [place] ; 2 daughters 2008, 2010    Rare alcohol, never smoker no drug use   Right handed   Caffeine: 1 or 2 cups a week   family history  includes Allergic rhinitis in her mother; Alzheimer's disease in her father; Diabetes in her paternal uncle; Heart disease in an other family member; Hyperlipidemia in her mother and sister; Migraines in her mother.   Review of Systems   Objective:   Physical Exam BP 104/76    Pulse 76    Ht 4\' 10"  (1.473 m)    Wt 139 lb (63 kg)    BMI 29.05 kg/m  Well-developed well-nourished Hispanic woman in no acute distress Diffuse lower abd tenderness right greater than left No CVAT  Total time 33 minutes

## 2021-10-12 DIAGNOSIS — N3010 Interstitial cystitis (chronic) without hematuria: Secondary | ICD-10-CM | POA: Diagnosis not present

## 2021-10-14 DIAGNOSIS — M6289 Other specified disorders of muscle: Secondary | ICD-10-CM | POA: Insufficient documentation

## 2021-10-14 DIAGNOSIS — R109 Unspecified abdominal pain: Secondary | ICD-10-CM | POA: Diagnosis not present

## 2021-10-14 DIAGNOSIS — R102 Pelvic and perineal pain: Secondary | ICD-10-CM | POA: Diagnosis not present

## 2021-10-14 DIAGNOSIS — N3010 Interstitial cystitis (chronic) without hematuria: Secondary | ICD-10-CM | POA: Diagnosis not present

## 2021-10-14 DIAGNOSIS — N9489 Other specified conditions associated with female genital organs and menstrual cycle: Secondary | ICD-10-CM | POA: Diagnosis not present

## 2021-10-14 DIAGNOSIS — N941 Unspecified dyspareunia: Secondary | ICD-10-CM | POA: Diagnosis not present

## 2021-10-15 DIAGNOSIS — R109 Unspecified abdominal pain: Secondary | ICD-10-CM | POA: Diagnosis not present

## 2021-10-16 DIAGNOSIS — R109 Unspecified abdominal pain: Secondary | ICD-10-CM | POA: Diagnosis not present

## 2021-10-22 DIAGNOSIS — N3010 Interstitial cystitis (chronic) without hematuria: Secondary | ICD-10-CM | POA: Diagnosis not present

## 2021-10-22 DIAGNOSIS — N9489 Other specified conditions associated with female genital organs and menstrual cycle: Secondary | ICD-10-CM | POA: Diagnosis not present

## 2021-10-22 DIAGNOSIS — R102 Pelvic and perineal pain: Secondary | ICD-10-CM | POA: Diagnosis not present

## 2021-10-22 DIAGNOSIS — N9419 Other specified dyspareunia: Secondary | ICD-10-CM | POA: Diagnosis not present

## 2021-10-26 ENCOUNTER — Telehealth: Payer: Self-pay

## 2021-10-26 NOTE — Telephone Encounter (Addendum)
Patient is scheduled 2/8.  Called to move forward with a different provider for tomorrow 2/7.  Patient states she wanted to wait for [place] and that she believes she is fine.   Patient Name: Sierra Hodge [facility] Gender: Female DOB: 1975/12/09 Age: 45 Y 1 M 23 D Return Phone Number: [phone] (Primary) Address: City/ State/ Zip: [place]  [address] Client [place] [place] Client Site [place] [facility] [place] [facility] Provider Orma Flaming- MD Contact Type Call Who Is Calling Patient / Member / Family / Caregiver Call Type Triage / Clinical Relationship To Patient Self Return Phone Number [phone] (Primary) Chief Complaint BREATHING - shortness of breath or sounds breathless Reason for Call Symptomatic / Request for [place] states she been having shaky and heart palpation , heart burn, Translation No Nurse Assessment Nurse: Doren Custard, RN, Caryl Pina Date/Time (Eastern Time): 10/26/2021 3:27:09 PM Confirm and document reason for call. If symptomatic, describe symptoms. ---Caller states she has been shaky and having heart palpitations. She is having chest pressure currently. Does the patient have any new or worsening symptoms? ---Yes Will a triage be completed? ---Yes Related visit to physician within the last 2 weeks? ---No Does the PT have any chronic conditions? (i.e. diabetes, asthma, this includes High risk factors for pregnancy, etc.) ---Yes List chronic conditions. ---asthma Is the patient pregnant or possibly pregnant? (Ask all females between the ages of 40-55) ---No Is this a behavioral health or substance abuse call? ---No Guidelines Guideline Title Affirmed Question Affirmed Notes Nurse Date/Time (Eastern Time) Chest Pain [1] Patient says chest pain feels exactly the same as previously diagnosed "heartburn" AND [2] describes burning [name] 10/26/2021 3:38:44  PM  Guidelines Guideline Title Affirmed Question Affirmed Notes Nurse Date/Time (Eastern Time) in chest AND [3] accompanying sour taste in mouth Disp. Time [name] Time) Disposition Final User 10/26/2021 3:25:25 PM Send to Urgent Consepcion Hearing 10/26/2021 3:39:49 PM Call PCP within 24 Hours Yes Doren Custard, RN, Hulan Saas Disagree/Comply Comply Caller Understands Yes PreDisposition Call Doctor Care Advice Given Per Guideline CALL PCP WITHIN 24 HOURS: * You need to discuss this with your doctor (or NP/PA) within the next 24 hours. * IF OFFICE WILL BE OPEN: Call the office when it opens tomorrow morning. * You become worse * Chest pain lasts over 5 minutes CALL BACK IF: * Difficulty breathing or unusual sweating occurs CARE ADVICE given per Chest Pain (Adult) guideline. Referrals REFERRED TO PCP OFFICE

## 2021-10-27 NOTE — Telephone Encounter (Signed)
Noted, I would still emphasize very low threshold for ED given these symptoms.

## 2021-10-28 ENCOUNTER — Other Ambulatory Visit: Payer: Self-pay

## 2021-10-28 ENCOUNTER — Ambulatory Visit: Payer: BC Managed Care – PPO | Admitting: Physician Assistant

## 2021-10-28 VITALS — BP 97/70 | HR 73 | Temp 98.2°F | Ht <= 58 in | Wt 142.5 lb

## 2021-10-28 DIAGNOSIS — M130 Polyarthritis, unspecified: Secondary | ICD-10-CM | POA: Diagnosis not present

## 2021-10-28 DIAGNOSIS — R002 Palpitations: Secondary | ICD-10-CM | POA: Diagnosis not present

## 2021-10-28 DIAGNOSIS — L4050 Arthropathic psoriasis, unspecified: Secondary | ICD-10-CM | POA: Diagnosis not present

## 2021-10-28 LAB — SEDIMENTATION RATE: Sed Rate: 16 mm/hr (ref 0–20)

## 2021-10-28 NOTE — Progress Notes (Signed)
Subjective:    Patient ID: Sierra Hodge, female    DOB: 03/05/1976, 46 y.o.   MRN: [id number]  No chief complaint on file.   HPI Patient is in today for palpitations.   Palpitations / shaky, heart racing about 4 weeks ago after having four cups of coffee. This resolved by the afternoon. Usually only has about one cup of coffee daily.   She is worried about heart disease. Two aunts and one uncle have passed from this.   Happens sometimes after she eats.  Denies any CP or SOB. No problems with exertion.   She is having a cystoscopy procedure March 3rd.   Psoriasis - definitely improved after moving from [place] to [place]. Sometimes has pain in R elbow, knees, back. Wanting labs checked -ESR, uric acid, RA, ANA   Past Medical History:  Diagnosis Date   Asthma    Chronic interstitial cystitis    Complication of anesthesia    hard to wake after anesthesia   Endometriosis    Fatty liver    Headache    Hyperlipidemia    IBS (irritable bowel syndrome)    Ileitis    IUD (intrauterine device) in place    Mirena, Dr. Leo Grosser   NAFLD (nonalcoholic fatty liver disease)    Other psoriasis     Past Surgical History:  Procedure Laterality Date   CESAREAN SECTION     2008, 2010   CHOLECYSTECTOMY N/A 07/07/2017   Procedure: LAPAROSCOPIC CHOLECYSTECTOMY;  Surgeon: Coralie Keens, MD;  Location: [facility];  Service: General;  Laterality: N/A;   CYSTO WITH HYDRODISTENSION  03/29/2012   Procedure: CYSTOSCOPY/HYDRODISTENSION;  Surgeon: Reece Packer, MD;  Location: [facility];  Service: Urology;  Laterality: N/A;  with Instillation of Marcaine and Pyridium    ESOPHAGOGASTRODUODENOSCOPY  2018   LAPAROSCOPY  03/29/2012   Procedure: LAPAROSCOPY OPERATIVE;  Surgeon: Eldred Manges, MD;  Location: [facility];  Service: Gynecology;  Laterality: N/A;  with Fulguration of Endometriosis and Peritoneal Biopsy   TONSILLECTOMY     TUBAL LIGATION  9450   UMBILICAL HERNIA REPAIR   10/2010    Family History  Problem Relation Age of Onset   Alzheimer's disease Father    Hyperlipidemia Mother    Migraines Mother    Allergic rhinitis Mother    Heart disease Other        paternal family   Diabetes Paternal Uncle    Hyperlipidemia Sister    Colon cancer Neg Hx    Breast cancer Neg Hx    Esophageal cancer Neg Hx    Rectal cancer Neg Hx    Stomach cancer Neg Hx     Social History   Tobacco Use   Smoking status: Never   Smokeless tobacco: Never  Vaping Use   Vaping Use: Never used  Substance Use Topics   Alcohol use: Yes    Comment: rarely    Drug use: No     Allergies  Allergen Reactions   Morphine And Related Itching   Mobic [Meloxicam] Other (See Comments)    bloating    Review of Systems NEGATIVE UNLESS OTHERWISE INDICATED IN HPI      Objective:     BP 97/70    Pulse 73    Temp 98.2 F (36.8 C)    Ht 4' 10"  (1.473 m)    Wt 142 lb 8 oz (64.6 kg)    SpO2 98%    BMI 29.78 kg/m   Wt Readings  from Last 3 Encounters:  10/28/21 142 lb 8 oz (64.6 kg)  10/07/21 139 lb (63 kg)  07/17/21 139 lb 3.2 oz (63.1 kg)    BP Readings from Last 3 Encounters:  10/28/21 97/70  10/07/21 104/76  07/17/21 110/72     Physical Exam Vitals and nursing note reviewed.  Constitutional:      Appearance: Normal appearance. She is normal weight. She is not toxic-appearing.  HENT:     Head: Normocephalic and atraumatic.     Right Ear: External ear normal.     Left Ear: External ear normal.     Nose: Nose normal.     Mouth/Throat:     Mouth: Mucous membranes are moist.  Eyes:     Extraocular Movements: Extraocular movements intact.     Conjunctiva/sclera: Conjunctivae normal.     Pupils: Pupils are equal, round, and reactive to light.  Cardiovascular:     Rate and Rhythm: Normal rate and regular rhythm.     Pulses: Normal pulses.     Heart sounds: Normal heart sounds.  Pulmonary:     Effort: Pulmonary effort is normal.     Breath sounds: Normal  breath sounds.  Musculoskeletal:        General: Normal range of motion.     Cervical back: Normal range of motion and neck supple.  Skin:    General: Skin is warm and dry.     Comments: Faint patches of psoriasis bilateral elbows   Neurological:     General: No focal deficit present.     Mental Status: She is alert and oriented to person, place, and time.  Psychiatric:        Mood and Affect: Mood normal.        Behavior: Behavior normal.        Thought Content: Thought content normal.        Judgment: Judgment normal.       Assessment & Plan:   Problem List Items Addressed This Visit   None Visit Diagnoses     Intermittent palpitations    -  Primary   Relevant Orders   EKG 12-Lead (Completed)   Psoriatic arthritis (HCC)       Relevant Orders   Sedimentation rate (Completed)   Uric acid (Completed)   Rheumatoid Factor (Completed)   ANA   Polyarticular arthritis       Relevant Orders   Sedimentation rate (Completed)   Uric acid (Completed)   Rheumatoid Factor (Completed)   ANA       1. Intermittent palpitations -My personal review of her ekg showed sinus bradycardia at 54 bpm with no ST or T wave changes. Reassured patient that her palpitations seem 2/2 caffeine intake. Nothing to suggest heart disease on exam today. -Given her concern about family history, discussed she could consider a coronary calcium score & /or talk with cardiology. She will let me know if she's interested in either of those. -Pt will also let me know if palpitations occur more frequently or persistently or new symptoms develop.  2. Psoriatic arthritis ([place]) 3. Polyarticular arthritis -Her psoriasis appears well-controlled -Will check labs as she is requesting today, treat pending results    Rae Plotner M Josefa [place], PA-C

## 2021-10-29 LAB — URIC ACID: Uric Acid, Serum: 4 mg/dL (ref 2.4–7.0)

## 2021-10-30 LAB — ANA: Anti Nuclear Antibody (ANA): NEGATIVE

## 2021-10-30 LAB — RHEUMATOID FACTOR: Rheumatoid fact SerPl-aCnc: <14 IU/mL (ref <14)

## 2021-11-10 DIAGNOSIS — N3010 Interstitial cystitis (chronic) without hematuria: Secondary | ICD-10-CM | POA: Diagnosis not present

## 2021-11-20 DIAGNOSIS — R3129 Other microscopic hematuria: Secondary | ICD-10-CM | POA: Insufficient documentation

## 2021-11-26 DIAGNOSIS — R102 Pelvic and perineal pain: Secondary | ICD-10-CM | POA: Diagnosis not present

## 2021-11-26 DIAGNOSIS — N809 Endometriosis, unspecified: Secondary | ICD-10-CM | POA: Diagnosis not present

## 2021-11-26 DIAGNOSIS — N3010 Interstitial cystitis (chronic) without hematuria: Secondary | ICD-10-CM | POA: Diagnosis not present

## 2021-11-26 DIAGNOSIS — N941 Unspecified dyspareunia: Secondary | ICD-10-CM | POA: Diagnosis not present

## 2021-11-26 DIAGNOSIS — N9489 Other specified conditions associated with female genital organs and menstrual cycle: Secondary | ICD-10-CM | POA: Diagnosis not present

## 2021-11-30 DIAGNOSIS — N809 Endometriosis, unspecified: Secondary | ICD-10-CM | POA: Diagnosis not present

## 2021-11-30 DIAGNOSIS — N3010 Interstitial cystitis (chronic) without hematuria: Secondary | ICD-10-CM | POA: Diagnosis not present

## 2021-11-30 DIAGNOSIS — N941 Unspecified dyspareunia: Secondary | ICD-10-CM | POA: Diagnosis not present

## 2021-11-30 DIAGNOSIS — R102 Pelvic and perineal pain: Secondary | ICD-10-CM | POA: Diagnosis not present

## 2021-11-30 DIAGNOSIS — N9489 Other specified conditions associated with female genital organs and menstrual cycle: Secondary | ICD-10-CM | POA: Diagnosis not present

## 2021-12-10 DIAGNOSIS — N9489 Other specified conditions associated with female genital organs and menstrual cycle: Secondary | ICD-10-CM | POA: Diagnosis not present

## 2021-12-10 DIAGNOSIS — N809 Endometriosis, unspecified: Secondary | ICD-10-CM | POA: Diagnosis not present

## 2021-12-10 DIAGNOSIS — R102 Pelvic and perineal pain: Secondary | ICD-10-CM | POA: Diagnosis not present

## 2021-12-10 DIAGNOSIS — N3010 Interstitial cystitis (chronic) without hematuria: Secondary | ICD-10-CM | POA: Diagnosis not present

## 2021-12-10 DIAGNOSIS — N941 Unspecified dyspareunia: Secondary | ICD-10-CM | POA: Diagnosis not present

## 2021-12-11 DIAGNOSIS — Z124 Encounter for screening for malignant neoplasm of cervix: Secondary | ICD-10-CM | POA: Diagnosis not present

## 2021-12-11 DIAGNOSIS — R102 Pelvic and perineal pain: Secondary | ICD-10-CM | POA: Diagnosis not present

## 2021-12-11 DIAGNOSIS — Z01411 Encounter for gynecological examination (general) (routine) with abnormal findings: Secondary | ICD-10-CM | POA: Diagnosis not present

## 2021-12-11 DIAGNOSIS — N803 Endometriosis of pelvic peritoneum, unspecified: Secondary | ICD-10-CM | POA: Diagnosis not present

## 2021-12-11 DIAGNOSIS — Z6829 Body mass index (BMI) 29.0-29.9, adult: Secondary | ICD-10-CM | POA: Diagnosis not present

## 2021-12-11 LAB — HM PAP SMEAR: HM Pap smear: NEGATIVE

## 2021-12-11 LAB — RESULTS CONSOLE HPV: CHL HPV: NEGATIVE

## 2021-12-14 ENCOUNTER — Encounter: Payer: Self-pay | Admitting: Internal Medicine

## 2021-12-17 DIAGNOSIS — N941 Unspecified dyspareunia: Secondary | ICD-10-CM | POA: Diagnosis not present

## 2021-12-17 DIAGNOSIS — R102 Pelvic and perineal pain: Secondary | ICD-10-CM | POA: Diagnosis not present

## 2021-12-17 DIAGNOSIS — N3010 Interstitial cystitis (chronic) without hematuria: Secondary | ICD-10-CM | POA: Diagnosis not present

## 2021-12-17 DIAGNOSIS — N9489 Other specified conditions associated with female genital organs and menstrual cycle: Secondary | ICD-10-CM | POA: Diagnosis not present

## 2021-12-17 DIAGNOSIS — N809 Endometriosis, unspecified: Secondary | ICD-10-CM | POA: Diagnosis not present

## 2021-12-23 DIAGNOSIS — R102 Pelvic and perineal pain: Secondary | ICD-10-CM | POA: Diagnosis not present

## 2021-12-23 DIAGNOSIS — N803 Endometriosis of pelvic peritoneum, unspecified: Secondary | ICD-10-CM | POA: Diagnosis not present

## 2021-12-23 DIAGNOSIS — Z30431 Encounter for routine checking of intrauterine contraceptive device: Secondary | ICD-10-CM | POA: Diagnosis not present

## 2021-12-23 DIAGNOSIS — N3011 Interstitial cystitis (chronic) with hematuria: Secondary | ICD-10-CM | POA: Diagnosis not present

## 2021-12-24 DIAGNOSIS — R102 Pelvic and perineal pain: Secondary | ICD-10-CM | POA: Diagnosis not present

## 2021-12-24 DIAGNOSIS — N941 Unspecified dyspareunia: Secondary | ICD-10-CM | POA: Diagnosis not present

## 2021-12-24 DIAGNOSIS — R351 Nocturia: Secondary | ICD-10-CM | POA: Diagnosis not present

## 2021-12-24 DIAGNOSIS — N9489 Other specified conditions associated with female genital organs and menstrual cycle: Secondary | ICD-10-CM | POA: Diagnosis not present

## 2021-12-24 DIAGNOSIS — N3010 Interstitial cystitis (chronic) without hematuria: Secondary | ICD-10-CM | POA: Diagnosis not present

## 2021-12-24 DIAGNOSIS — R350 Frequency of micturition: Secondary | ICD-10-CM | POA: Diagnosis not present

## 2021-12-24 DIAGNOSIS — N809 Endometriosis, unspecified: Secondary | ICD-10-CM | POA: Diagnosis not present

## 2021-12-31 DIAGNOSIS — R102 Pelvic and perineal pain: Secondary | ICD-10-CM | POA: Diagnosis not present

## 2021-12-31 DIAGNOSIS — N809 Endometriosis, unspecified: Secondary | ICD-10-CM | POA: Diagnosis not present

## 2021-12-31 DIAGNOSIS — N941 Unspecified dyspareunia: Secondary | ICD-10-CM | POA: Diagnosis not present

## 2021-12-31 DIAGNOSIS — R351 Nocturia: Secondary | ICD-10-CM | POA: Diagnosis not present

## 2021-12-31 DIAGNOSIS — N3010 Interstitial cystitis (chronic) without hematuria: Secondary | ICD-10-CM | POA: Diagnosis not present

## 2021-12-31 DIAGNOSIS — N9489 Other specified conditions associated with female genital organs and menstrual cycle: Secondary | ICD-10-CM | POA: Diagnosis not present

## 2021-12-31 DIAGNOSIS — R350 Frequency of micturition: Secondary | ICD-10-CM | POA: Diagnosis not present

## 2022-01-05 DIAGNOSIS — R351 Nocturia: Secondary | ICD-10-CM | POA: Diagnosis not present

## 2022-01-05 DIAGNOSIS — N941 Unspecified dyspareunia: Secondary | ICD-10-CM | POA: Diagnosis not present

## 2022-01-05 DIAGNOSIS — R102 Pelvic and perineal pain: Secondary | ICD-10-CM | POA: Diagnosis not present

## 2022-01-05 DIAGNOSIS — R350 Frequency of micturition: Secondary | ICD-10-CM | POA: Diagnosis not present

## 2022-01-05 DIAGNOSIS — N809 Endometriosis, unspecified: Secondary | ICD-10-CM | POA: Diagnosis not present

## 2022-01-05 DIAGNOSIS — N3010 Interstitial cystitis (chronic) without hematuria: Secondary | ICD-10-CM | POA: Diagnosis not present

## 2022-01-05 DIAGNOSIS — N9489 Other specified conditions associated with female genital organs and menstrual cycle: Secondary | ICD-10-CM | POA: Diagnosis not present

## 2022-01-12 DIAGNOSIS — N9489 Other specified conditions associated with female genital organs and menstrual cycle: Secondary | ICD-10-CM | POA: Diagnosis not present

## 2022-01-12 DIAGNOSIS — R102 Pelvic and perineal pain: Secondary | ICD-10-CM | POA: Diagnosis not present

## 2022-01-12 DIAGNOSIS — R350 Frequency of micturition: Secondary | ICD-10-CM | POA: Diagnosis not present

## 2022-01-12 DIAGNOSIS — R351 Nocturia: Secondary | ICD-10-CM | POA: Diagnosis not present

## 2022-01-12 DIAGNOSIS — N809 Endometriosis, unspecified: Secondary | ICD-10-CM | POA: Diagnosis not present

## 2022-01-12 DIAGNOSIS — N941 Unspecified dyspareunia: Secondary | ICD-10-CM | POA: Diagnosis not present

## 2022-01-12 DIAGNOSIS — N3010 Interstitial cystitis (chronic) without hematuria: Secondary | ICD-10-CM | POA: Diagnosis not present

## 2022-01-15 DIAGNOSIS — N3010 Interstitial cystitis (chronic) without hematuria: Secondary | ICD-10-CM | POA: Diagnosis not present

## 2022-01-19 DIAGNOSIS — N809 Endometriosis, unspecified: Secondary | ICD-10-CM | POA: Diagnosis not present

## 2022-01-19 DIAGNOSIS — R102 Pelvic and perineal pain: Secondary | ICD-10-CM | POA: Diagnosis not present

## 2022-01-19 DIAGNOSIS — N9489 Other specified conditions associated with female genital organs and menstrual cycle: Secondary | ICD-10-CM | POA: Diagnosis not present

## 2022-01-19 DIAGNOSIS — N941 Unspecified dyspareunia: Secondary | ICD-10-CM | POA: Diagnosis not present

## 2022-01-19 DIAGNOSIS — N3010 Interstitial cystitis (chronic) without hematuria: Secondary | ICD-10-CM | POA: Diagnosis not present

## 2022-03-04 ENCOUNTER — Ambulatory Visit: Payer: BC Managed Care – PPO | Admitting: Physician Assistant

## 2022-03-04 VITALS — BP 102/78 | HR 85 | Temp 98.3°F | Ht <= 58 in | Wt 139.6 lb

## 2022-03-04 DIAGNOSIS — L247 Irritant contact dermatitis due to plants, except food: Secondary | ICD-10-CM | POA: Diagnosis not present

## 2022-03-04 MED ORDER — METHYLPREDNISOLONE ACETATE 80 MG/ML IJ SUSP
80.0000 mg | Freq: Once | INTRAMUSCULAR | Status: AC
  Administered 2022-03-04: 80 mg via INTRAMUSCULAR

## 2022-03-04 NOTE — Progress Notes (Signed)
Subjective:    Patient ID: Sierra Hodge, female    DOB: December 26, 1975, 46 y.o.   MRN: [id number]  Chief Complaint  Patient presents with   Rash    Pt c/o recurring rash that itches a lot, usually happens every Spring, all over arms legs and scalp; tries OTC hydrocortisone cream and benadryl, even OTC for poison Ivy scrub wash with no relief; pt just wanting to find out what she is allergic to.     Rash   Patient is in today for very itchy rash. Started on arms, now on both legs, face, scalp about one week ago. OTC cortisone, Benadryl, poison ivy scrub, not much helping her.  Happens every year after working in the garden, not sure what she is allergic to. No other symptoms   Past Medical History:  Diagnosis Date   Asthma    Chronic interstitial cystitis    Complication of anesthesia    hard to wake after anesthesia   Endometriosis    Fatty liver    Headache    Hyperlipidemia    IBS (irritable bowel syndrome)    Ileitis    IUD (intrauterine device) in place    Mirena, Dr. Pennie Rushing   NAFLD (nonalcoholic fatty liver disease)    Other psoriasis     Past Surgical History:  Procedure Laterality Date   CESAREAN SECTION     2008, 2010   CHOLECYSTECTOMY N/A 07/07/2017   Procedure: LAPAROSCOPIC CHOLECYSTECTOMY;  Surgeon: Abigail Miyamoto, MD;  Location: [facility];  Service: General;  Laterality: N/A;   CYSTO WITH HYDRODISTENSION  03/29/2012   Procedure: CYSTOSCOPY/HYDRODISTENSION;  Surgeon: Martina Sinner, MD;  Location: [facility];  Service: Urology;  Laterality: N/A;  with Instillation of Marcaine and Pyridium    ESOPHAGOGASTRODUODENOSCOPY  2018   LAPAROSCOPY  03/29/2012   Procedure: LAPAROSCOPY OPERATIVE;  Surgeon: Hal Morales, MD;  Location: [facility];  Service: Gynecology;  Laterality: N/A;  with Fulguration of Endometriosis and Peritoneal Biopsy   TONSILLECTOMY     TUBAL LIGATION  2010   UMBILICAL HERNIA REPAIR  10/2010    Family History  Problem  Relation Age of Onset   Alzheimer's disease Father    Hyperlipidemia Mother    Migraines Mother    Allergic rhinitis Mother    Heart disease Other        paternal family   Diabetes Paternal Uncle    Hyperlipidemia Sister    Colon cancer Neg Hx    Breast cancer Neg Hx    Esophageal cancer Neg Hx    Rectal cancer Neg Hx    Stomach cancer Neg Hx     Social History   Tobacco Use   Smoking status: Never   Smokeless tobacco: Never  Vaping Use   Vaping Use: Never used  Substance Use Topics   Alcohol use: Yes    Comment: rarely    Drug use: No     Allergies  Allergen Reactions   Morphine And Related Itching   Mobic [Meloxicam] Other (See Comments)    bloating    Review of Systems  Skin:  Positive for rash.   NEGATIVE UNLESS OTHERWISE INDICATED IN HPI      Objective:     BP 102/78 (BP Location: Right Arm)   Pulse 85   Temp 98.3 F (36.8 C) (Temporal)   Ht 4\' 10"  (1.473 m)   Wt 139 lb 9.6 oz (63.3 kg)   SpO2 98%   BMI 29.18 kg/m  Wt Readings from Last 3 Encounters:  03/04/22 139 lb 9.6 oz (63.3 kg)  10/28/21 142 lb 8 oz (64.6 kg)  10/07/21 139 lb (63 kg)    BP Readings from Last 3 Encounters:  03/04/22 102/78  10/28/21 97/70  10/07/21 104/76     Physical Exam Constitutional:      Appearance: Normal appearance.  Cardiovascular:     Rate and Rhythm: Normal rate and regular rhythm.     Pulses: Normal pulses.     Heart sounds: No murmur heard. Pulmonary:     Effort: Pulmonary effort is normal.     Breath sounds: Normal breath sounds.  Skin:    Comments: Small patches of erythema diffusely on arms, legs, neck, scalp, face c/w likely contact derm. Pt scratching herself entire time in exam room.   Neurological:     Mental Status: She is alert.  Psychiatric:        Mood and Affect: Mood normal.        Assessment & Plan:   Problem List Items Addressed This Visit   None Visit Diagnoses     Contact dermatitis and eczema due to plant    -   Primary   Relevant Medications   methylPREDNISolone acetate (DEPO-MEDROL) injection 80 mg (Completed)        Meds ordered this encounter  Medications   methylPREDNISolone acetate (DEPO-MEDROL) injection 80 mg   Looks like contact rash to plants ?poison oak / ivy. Depo 80 mg IM in office Keep cool and hydrated Calamine lotion Try not to scratch!  Benadryl prn orally  Return if symptoms worsen or fail to improve.  This note was prepared with assistance of Conservation officer, historic buildings. Occasional wrong-word or sound-a-like substitutions may have occurred due to the inherent limitations of voice recognition software.    Leanah Kolander M Lashawnda Hancox, PA-C

## 2022-03-04 NOTE — Patient Instructions (Signed)
Looks like contact rash to plants ?poison oak / ivy. Depo 80 mg IM in office Keep cool and hydrated Calamine lotion Try not to scratch!  Benadryl prn orally

## 2022-05-03 DIAGNOSIS — N941 Unspecified dyspareunia: Secondary | ICD-10-CM | POA: Diagnosis not present

## 2022-05-03 DIAGNOSIS — N3010 Interstitial cystitis (chronic) without hematuria: Secondary | ICD-10-CM | POA: Diagnosis not present

## 2022-05-03 DIAGNOSIS — R102 Pelvic and perineal pain: Secondary | ICD-10-CM | POA: Diagnosis not present

## 2022-05-03 DIAGNOSIS — N809 Endometriosis, unspecified: Secondary | ICD-10-CM | POA: Diagnosis not present

## 2022-05-12 DIAGNOSIS — M6289 Other specified disorders of muscle: Secondary | ICD-10-CM | POA: Diagnosis not present

## 2022-05-12 DIAGNOSIS — M62838 Other muscle spasm: Secondary | ICD-10-CM | POA: Diagnosis not present

## 2022-05-12 DIAGNOSIS — N3010 Interstitial cystitis (chronic) without hematuria: Secondary | ICD-10-CM | POA: Diagnosis not present

## 2022-05-12 DIAGNOSIS — M6281 Muscle weakness (generalized): Secondary | ICD-10-CM | POA: Diagnosis not present

## 2022-05-28 DIAGNOSIS — M62838 Other muscle spasm: Secondary | ICD-10-CM | POA: Diagnosis not present

## 2022-05-28 DIAGNOSIS — R102 Pelvic and perineal pain: Secondary | ICD-10-CM | POA: Diagnosis not present

## 2022-05-28 DIAGNOSIS — M6289 Other specified disorders of muscle: Secondary | ICD-10-CM | POA: Diagnosis not present

## 2022-05-28 DIAGNOSIS — M6281 Muscle weakness (generalized): Secondary | ICD-10-CM | POA: Diagnosis not present

## 2022-05-31 NOTE — Progress Notes (Unsigned)
Sierra Hodge 46 y.o. April 08, 1976 [id number]  Assessment & Plan:   Encounter Diagnoses  Name Primary?   Right lower quadrant abdominal tenderness without rebound tenderness Yes   Chronic RLQ pain    Heartburn    Interstitial cystitis    Irritable bowel syndrome    High-tone pelvic floor dysfunction in female    This is most likely a recurrence of her chronic recurrent pain issues.  She says this most recent flare was similar to March 2022.  Note that at that time a CT urogram study demonstrated some mild retroperitoneal fat stranding anterior to the L3-4 disc space.  Was nonspecific and the radiologist made a recommendation to consider MRI of the spine to evaluate low back pain if desired.  Unfortunately a discrete cause has not been identified though it is consistent in someone with interstitial cystitis IBS and pelvic floor dysfunction.  There is a suggestion of a musculoskeletal component as well.  Reassess with CT scan abdomen and pelvis with contrast.  Further plans pending that, unless that shows [place] a discrete abnormality that would respond to specific treatment, I would agree with Dr. Logan Bores that amitriptyline or other functional type pain treatment makes sense.  Question laparoscopy to look for endometriosis.    CC: Allwardt, Crist Infante, PA-C Dr. Sallye Ober Dr. Logan Bores  CT results  IMPRESSION: 1. No acute abnormality  in the abdomen or pelvis. 2. Moderate volume of formed stool in the right hemicolon may reflect constipation/slow transit. 3. Scattered left-sided colonic diverticulosis without findings of acute diverticulitis.  I do not plan on further GI work-up.  She may consider the amitriptyline.  Question if gynecology would perform laparoscopy.  Subjective:   Chief Complaint: Right lower quadrant pain  HPI 46 year old woman with a history of chronic abdominal pain, interstitial cystitis and high tone pelvic floor dysfunction as well as endometriosis who is here for  follow-up with persistent complaints of abdominal pain and some recent heartburn.  Last seen here in January 2023 with the following assessment and plan:  Encounter Diagnoses  Name Primary?   Chronic RLQ pain Yes   Heartburn     Chronic bilateral low back pain without sciatica     Interstitial cystitis     Endometriosis     Irritable bowel syndrome with constipation      I am not sure what is causing her pain.  Endometriosis, interstitial cystitis are still in the differential.  She had terminal ileal erosions but the rest of the work-up looking for the possibility of IBD was negative (serology, calprotectin).  She does not have much in the way of bowel symptoms at this time so I do not think this is irritable bowel syndrome but we have not ruled out a GI cause admittedly.   Heartburn responds well to low-dose H2 blocker before meals we will continue that   Dicyclomine is not helping discontinue that   GERD diet is recommended and provided   Will await GU evaluation next.  Consider repeating a CT scan.  She had this retroperitoneal possible inflammation on a CT urogram about a year ago.  She was having a lot more back pain then so I am not sure about the utility of checking an MRI but that is a possibility of checking a spine MRI in the lumbar region.  May defer that to primary care.  Another thought would be to do pelvic x-rays looking for sacroiliitis.     Patient was seen in urology on January  25.  I have reviewed the note.  Their thoughts were and it sounds very plausible that her high tone pelvic floor dysfunction was bothering her.  She was going to try baclofen as needed.  They were ordering a renal ultrasound and KUB as well.  They were both negative.  Specific note I would say no bony abnormalities other than mild polyarticular degenerative changes no mention of sacroiliitis in the x-ray.   She should continue with this treatment plan and see me as needed    She had an  ultrasound of the retroperitoneum and urinary tract after that urology appointment with Dr. Sharlot Gowda group, and there was an adnexal cyst or adnexal cyst seen on the right.  She followed up with gynecology and pelvic ultrasound by Dr. Sallye Ober in April was negative.  Question if there could be endometriosis.  The patient stopped Mirena IUD at that point.   She recently saw Dr. Logan Bores on May 03, 2022 and the plan was to try amitriptyline for multiple symptoms including right lower quadrant pain and her interstitial cystitis.  She has continued with pelvic floor physical therapy for her high tone pelvic floor dysfunction.  His plan was to try to add amitriptyline and to schedule a cystoscopy.  She was very concerned about microhematuria though he was less so.  He was also considering an alternative treatment Elmiron like an herbal therapy as she was concerned about potential ocular issues with Elmiron, and the other option would be to consider hydrodistention.  He did admit that it did not clear that her problems were from IC but possible.  She tells me she was frustrated with his idea of starting amitriptyline and just treating the pain.  She did not mention any other recommendations he had described in his note.  She did not take the amitriptyline.  She tells me that several weeks ago she had 2 weeks of severe heartburn and upper abdominal pressure that remitted but then she had a flare of her right lower quadrant pain.  She has been going to physical therapy for high tone pelvic floor dysfunction and [facility] but the physical therapist told her she thought that the right groin and lower quadrant pain she was having was more likely bowel and not bladder.  Note the patient went to [place] for several weeks and felt great when she was there prior to all of this.  At this point she is using Tylenol with some benefit but is quite bothered by her symptoms though they are improved.  There is some bilateral  low back pain, there is no change in defecation but food seems to intensify the pain so she says she stopped eating.  Sitting and movement bothers this area as well.  She has not had fever there is no diarrhea there is no constipation.  In the past she had used dicyclomine but she did not think that that was helping so she stopped it.     EGD 2018-normal Colonoscopy 2022 ileal erosions and diverticulosis Allergies  Allergen Reactions   Morphine And Related Itching   Mobic [Meloxicam] Other (See Comments)    bloating   Current Meds  Medication Sig   albuterol (VENTOLIN HFA) 108 (90 Base) MCG/ACT inhaler Inhale 2 puffs into the lungs every 6 (six) hours as needed for wheezing or shortness of breath.   Calcium Glycerophosphate (PRELIEF PO) Take by mouth. Before meals   clobetasol ointment (TEMOVATE) 0.05 % Apply topically daily as needed.  famotidine (PEPCID) 10 MG tablet Take 20 mg by mouth 3 (three) times daily before meals.   hydrOXYzine (ATARAX/VISTARIL) 10 MG tablet hydroxyzine HCl 10 mg tablet  TAKE 1 TABLET BY MOUTH EVERYDAY AT BEDTIME   levonorgestrel (MIRENA) 20 MCG/24HR IUD 1 each by Intrauterine route once.   montelukast (SINGULAIR) 10 MG tablet Take 10 mg by mouth daily.   triamcinolone ointment (KENALOG) 0.1 % Use 1 application sparingly twice a day as needed to red itchy areas. Do not use on face, neck, groin, or armpit region   Past Medical History:  Diagnosis Date   Asthma    Chronic interstitial cystitis    Complication of anesthesia    hard to wake after anesthesia   Endometriosis    Fatty liver    Headache    Hyperlipidemia    IBS (irritable bowel syndrome)    Ileitis    IUD (intrauterine device) in place    Mirena, Dr. Pennie Rushing   NAFLD (nonalcoholic fatty liver disease)    Other psoriasis    Past Surgical History:  Procedure Laterality Date   CESAREAN SECTION     2008, 2010   CHOLECYSTECTOMY N/A 07/07/2017   Procedure: LAPAROSCOPIC CHOLECYSTECTOMY;   Surgeon: Abigail Miyamoto, MD;  Location: [facility];  Service: General;  Laterality: N/A;   CYSTO WITH HYDRODISTENSION  03/29/2012   Procedure: CYSTOSCOPY/HYDRODISTENSION;  Surgeon: Martina Sinner, MD;  Location: [facility];  Service: Urology;  Laterality: N/A;  with Instillation of Marcaine and Pyridium    ESOPHAGOGASTRODUODENOSCOPY  2018   LAPAROSCOPY  03/29/2012   Procedure: LAPAROSCOPY OPERATIVE;  Surgeon: Hal Morales, MD;  Location: [facility];  Service: Gynecology;  Laterality: N/A;  with Fulguration of Endometriosis and Peritoneal Biopsy   TONSILLECTOMY     TUBAL LIGATION  2010   UMBILICAL HERNIA REPAIR  10/2010   Social History   Social History Narrative   Married    Employed as a Research officer, trade union,  mainly medical   From [place] ; 2 daughters 2008, 2010    Rare alcohol, never smoker no drug use   Right handed   Caffeine: 1 or 2 cups a week   family history includes Allergic rhinitis in her mother; Alzheimer's disease in her father; Diabetes in her paternal uncle; Heart disease in an other family member; Hyperlipidemia in her mother and sister; Migraines in her mother.   Review of Systems As per HPI  Objective:   Physical Exam BP 120/68   Pulse 73   Ht 4\' 11"  (1.499 m)   Wt 139 lb 6.4 oz (63.2 kg)   SpO2 96%   BMI 28.16 kg/m  WDWN NAD Back NT, no CVAT Abd soft, tender RLQ/groin - mild-moderate no mass and + Carnett's features also - she is also tender LUQ/mid abdomen - soft and benign feel w/o rebound, BS + no bruits  No pain elicited with hip flexion or extension or internal/external rotation.  Hip joint appeared supple and mobile.

## 2022-06-01 ENCOUNTER — Other Ambulatory Visit (INDEPENDENT_AMBULATORY_CARE_PROVIDER_SITE_OTHER): Payer: BC Managed Care – PPO

## 2022-06-01 ENCOUNTER — Ambulatory Visit (HOSPITAL_BASED_OUTPATIENT_CLINIC_OR_DEPARTMENT_OTHER)
Admission: RE | Admit: 2022-06-01 | Discharge: 2022-06-01 | Disposition: A | Discharge status: Home / Self Care | Payer: BC Managed Care – PPO | Source: Ambulatory Visit | Attending: Internal Medicine | Admitting: Internal Medicine

## 2022-06-01 ENCOUNTER — Ambulatory Visit: Payer: BC Managed Care – PPO | Admitting: Internal Medicine

## 2022-06-01 ENCOUNTER — Encounter: Payer: Self-pay | Admitting: Internal Medicine

## 2022-06-01 VITALS — BP 120/68 | HR 73 | Ht 59.0 in | Wt 139.4 lb

## 2022-06-01 DIAGNOSIS — K769 Liver disease, unspecified: Secondary | ICD-10-CM | POA: Diagnosis not present

## 2022-06-01 DIAGNOSIS — R12 Heartburn: Secondary | ICD-10-CM

## 2022-06-01 DIAGNOSIS — G8929 Other chronic pain: Secondary | ICD-10-CM | POA: Insufficient documentation

## 2022-06-01 DIAGNOSIS — R10813 Right lower quadrant abdominal tenderness: Secondary | ICD-10-CM | POA: Insufficient documentation

## 2022-06-01 DIAGNOSIS — R1031 Right lower quadrant pain: Secondary | ICD-10-CM

## 2022-06-01 DIAGNOSIS — M6289 Other specified disorders of muscle: Secondary | ICD-10-CM

## 2022-06-01 DIAGNOSIS — N3010 Interstitial cystitis (chronic) without hematuria: Secondary | ICD-10-CM | POA: Diagnosis not present

## 2022-06-01 DIAGNOSIS — K589 Irritable bowel syndrome without diarrhea: Secondary | ICD-10-CM

## 2022-06-01 LAB — CBC WITH DIFFERENTIAL/PLATELET
Basophils Absolute: 0 10*3/uL (ref 0.0–0.1)
Basophils Relative: 0.4 % (ref 0.0–3.0)
Eosinophils Absolute: 0.1 10*3/uL (ref 0.0–0.7)
Eosinophils Relative: 0.7 % (ref 0.0–5.0)
HCT: 41.8 % (ref 36.0–46.0)
Hemoglobin: 14.1 g/dL (ref 12.0–15.0)
Lymphocytes Relative: 27.4 % (ref 12.0–46.0)
Lymphs Abs: 2.2 10*3/uL (ref 0.7–4.0)
MCHC: 33.7 g/dL (ref 30.0–36.0)
MCV: 87.5 fl (ref 78.0–100.0)
Monocytes Absolute: 0.6 10*3/uL (ref 0.1–1.0)
Monocytes Relative: 6.9 % (ref 3.0–12.0)
Neutro Abs: 5.2 10*3/uL (ref 1.4–7.7)
Neutrophils Relative %: 64.6 % (ref 43.0–77.0)
Platelets: 260 10*3/uL (ref 150.0–400.0)
RBC: 4.77 Mil/uL (ref 3.87–5.11)
RDW: 14.1 % (ref 11.5–15.5)
WBC: 8.1 10*3/uL (ref 4.0–10.5)

## 2022-06-01 LAB — COMPREHENSIVE METABOLIC PANEL
ALT: 27 U/L (ref 0–35)
AST: 20 U/L (ref 0–37)
Albumin: 4.4 g/dL (ref 3.5–5.2)
Alkaline Phosphatase: 51 U/L (ref 39–117)
BUN: 12 mg/dL (ref 6–23)
CO2: 28 mEq/L (ref 19–32)
Calcium: 10 mg/dL (ref 8.4–10.5)
Chloride: 101 mEq/L (ref 96–112)
Creatinine, Ser: 0.64 mg/dL (ref 0.40–1.20)
GFR: 105 mL/min (ref >60.00)
Glucose, Bld: 83 mg/dL (ref 70–99)
Potassium: 3.9 mEq/L (ref 3.5–5.1)
Sodium: 136 mEq/L (ref 135–145)
Total Bilirubin: 1.1 mg/dL (ref 0.2–1.2)
Total Protein: 7.6 g/dL (ref 6.0–8.3)

## 2022-06-01 MED ORDER — IOHEXOL 300 MG/ML  SOLN
100.0000 mL | Freq: Once | INTRAMUSCULAR | Status: AC | PRN
  Administered 2022-06-01: 100 mL via INTRAVENOUS

## 2022-06-01 NOTE — Patient Instructions (Addendum)
Your provider has requested that you go to the basement level for lab work before leaving today. Press "B" on the elevator. The lab is located at the first door on the left as you exit the elevator.  Due to recent changes in healthcare laws, you may see the results of your imaging and laboratory studies on MyChart before your provider has had a chance to review them.  We understand that in some cases there may be results that are confusing or concerning to you. Not all laboratory results come back in the same time frame and the provider may be waiting for multiple results in order to interpret others.  Please give [place] 48 hours in order for your provider to thoroughly review all the results before contacting the office for clarification of your results.   You have been scheduled for a CT scan of the abdomen and pelvis at  [facility]. You are scheduled on 06/01/22 at 4:00pm. You should arrive 30 minutes prior to your appointment time for registration.  We are giving you 2 bottles of contrast today that you will need to drink before arriving for the exam. The solution may taste better if refrigerated so put them in the refrigerator when you get home, but do NOT add ice or any other liquid to this solution as that would dilute it. Shake well before drinking.   Please follow the written instructions below on the day of your exam:   1) Do not eat anything after 12:00pm (4 hours prior to your test)   2) Drink 1 bottle of contrast @ 2:00pm (2 hours prior to your exam)  Remember to shake well before drinking and do NOT pour over ice.     Drink 1 bottle of contrast @ 3:00pm (1 hour prior to your exam)   You may take any medications as prescribed with a small amount of water, if necessary. If you take any of the following medications: METFORMIN, GLUCOPHAGE, GLUCOVANCE, AVANDAMET, RIOMET, FORTAMET, Benitez MET, JANUMET, GLUMETZA or METAGLIP, you MAY be asked to HOLD this medication 48 hours  AFTER the exam.   The purpose of you drinking the oral contrast is to aid in the visualization of your intestinal tract. The contrast solution may cause some diarrhea. Depending on your individual set of symptoms, you may also receive an intravenous injection of x-ray contrast/dye. Plan on being at [facility] for 45 minutes or longer, depending on the type of exam you are having performed.   If you have any questions regarding your exam or if you need to reschedule, you may call [facility] at [phone] between the hours of 8:00 am and 5:00 pm, Monday-Friday.    I appreciate the opportunity to care for you. Silvano Rusk, MD, [facility]

## 2022-06-03 ENCOUNTER — Encounter: Payer: Self-pay | Admitting: Internal Medicine

## 2022-06-03 DIAGNOSIS — R399 Unspecified symptoms and signs involving the genitourinary system: Secondary | ICD-10-CM | POA: Diagnosis not present

## 2022-06-10 DIAGNOSIS — N9419 Other specified dyspareunia: Secondary | ICD-10-CM | POA: Diagnosis not present

## 2022-06-10 DIAGNOSIS — M62838 Other muscle spasm: Secondary | ICD-10-CM | POA: Diagnosis not present

## 2022-06-10 DIAGNOSIS — M6289 Other specified disorders of muscle: Secondary | ICD-10-CM | POA: Diagnosis not present

## 2022-06-10 DIAGNOSIS — M6281 Muscle weakness (generalized): Secondary | ICD-10-CM | POA: Diagnosis not present

## 2022-06-14 ENCOUNTER — Encounter: Payer: Self-pay | Admitting: *Deleted

## 2022-06-21 DIAGNOSIS — M62838 Other muscle spasm: Secondary | ICD-10-CM | POA: Diagnosis not present

## 2022-06-21 DIAGNOSIS — M6281 Muscle weakness (generalized): Secondary | ICD-10-CM | POA: Diagnosis not present

## 2022-06-21 DIAGNOSIS — R102 Pelvic and perineal pain: Secondary | ICD-10-CM | POA: Diagnosis not present

## 2022-06-21 DIAGNOSIS — M6289 Other specified disorders of muscle: Secondary | ICD-10-CM | POA: Diagnosis not present

## 2022-07-01 DIAGNOSIS — M62838 Other muscle spasm: Secondary | ICD-10-CM | POA: Diagnosis not present

## 2022-07-01 DIAGNOSIS — M6281 Muscle weakness (generalized): Secondary | ICD-10-CM | POA: Diagnosis not present

## 2022-07-01 DIAGNOSIS — M6289 Other specified disorders of muscle: Secondary | ICD-10-CM | POA: Diagnosis not present

## 2022-07-01 DIAGNOSIS — R102 Pelvic and perineal pain: Secondary | ICD-10-CM | POA: Diagnosis not present

## 2022-07-13 DIAGNOSIS — M62838 Other muscle spasm: Secondary | ICD-10-CM | POA: Diagnosis not present

## 2022-07-13 DIAGNOSIS — M6281 Muscle weakness (generalized): Secondary | ICD-10-CM | POA: Diagnosis not present

## 2022-07-13 DIAGNOSIS — R102 Pelvic and perineal pain: Secondary | ICD-10-CM | POA: Diagnosis not present

## 2022-07-13 DIAGNOSIS — M6289 Other specified disorders of muscle: Secondary | ICD-10-CM | POA: Diagnosis not present

## 2022-07-16 ENCOUNTER — Ambulatory Visit (INDEPENDENT_AMBULATORY_CARE_PROVIDER_SITE_OTHER): Payer: BC Managed Care – PPO | Admitting: Physician Assistant

## 2022-07-16 ENCOUNTER — Encounter: Payer: Self-pay | Admitting: Physician Assistant

## 2022-07-16 VITALS — BP 109/74 | HR 89 | Temp 97.8°F | Ht 59.0 in | Wt 139.0 lb

## 2022-07-16 DIAGNOSIS — E782 Mixed hyperlipidemia: Secondary | ICD-10-CM | POA: Diagnosis not present

## 2022-07-16 DIAGNOSIS — G8929 Other chronic pain: Secondary | ICD-10-CM

## 2022-07-16 DIAGNOSIS — E7800 Pure hypercholesterolemia, unspecified: Secondary | ICD-10-CM | POA: Insufficient documentation

## 2022-07-16 DIAGNOSIS — Z131 Encounter for screening for diabetes mellitus: Secondary | ICD-10-CM

## 2022-07-16 DIAGNOSIS — Z0000 Encounter for general adult medical examination without abnormal findings: Secondary | ICD-10-CM

## 2022-07-16 DIAGNOSIS — R1031 Right lower quadrant pain: Secondary | ICD-10-CM

## 2022-07-16 LAB — LIPID PANEL
Cholesterol: 214 mg/dL — ABNORMAL HIGH (ref 0–200)
HDL: 45.4 mg/dL (ref >39.00)
LDL Cholesterol: 152 mg/dL — ABNORMAL HIGH (ref 0–99)
NonHDL: 168.55
Total CHOL/HDL Ratio: 5
Triglycerides: 84 mg/dL (ref 0.0–149.0)
VLDL: 16.8 mg/dL (ref 0.0–40.0)

## 2022-07-16 LAB — HEMOGLOBIN A1C: Hgb A1c MFr Bld: 5.8 % (ref 4.6–6.5)

## 2022-07-16 NOTE — Patient Instructions (Addendum)
Great to see you today!   Labs today Mammogram ordered  Glad you're seeing urogynecology  Let me know if anything changes

## 2022-07-16 NOTE — Assessment & Plan Note (Signed)
Vast workup completed so far. Urogyn consult coming up next month.

## 2022-07-16 NOTE — Progress Notes (Signed)
Subjective:    Patient ID: Sierra Hodge, female    DOB: Oct 06, 1975, 46 y.o.   MRN: [id number]  Chief Complaint  Patient presents with   Annual Exam   Flank Pain    Ongoing for over a year. Has had testing previously. All testing was normal.    HPI Patient is in today for annual exam. Grieving loss of 27 year old nephew - motorcycle accident. She is needing mammogram updated. She is trying to work on lifestyle changes. Needing a few labs today.   Acute concerns: RLQ, suprapubic, periumbilical abdominal pain off / on - went to GI, GYN, Urologist; everything clear, all think this is related to endometriosis and IC. Currently doing pelvic floor therapy - certain massages around the hips worsens but ultimately helps the pain. She has a second opinion coming up with Urogyn.    Past Medical History:  Diagnosis Date   Asthma    Chronic interstitial cystitis    Complication of anesthesia    hard to wake after anesthesia   Endometriosis    Fatty liver    Headache    Hyperlipidemia    IBS (irritable bowel syndrome)    Ileitis    IUD (intrauterine device) in place    Mirena, Dr. Leo Grosser   NAFLD (nonalcoholic fatty liver disease)    Other psoriasis     Past Surgical History:  Procedure Laterality Date   CESAREAN SECTION     2008, 2010   CHOLECYSTECTOMY N/A 07/07/2017   Procedure: LAPAROSCOPIC CHOLECYSTECTOMY;  Surgeon: Coralie Keens, MD;  Location: [place];  Service: General;  Laterality: N/A;   CYSTO WITH HYDRODISTENSION  03/29/2012   Procedure: CYSTOSCOPY/HYDRODISTENSION;  Surgeon: Reece Packer, MD;  Location: [facility];  Service: Urology;  Laterality: N/A;  with Instillation of Marcaine and Pyridium    ESOPHAGOGASTRODUODENOSCOPY  2018   LAPAROSCOPY  03/29/2012   Procedure: LAPAROSCOPY OPERATIVE;  Surgeon: Eldred Manges, MD;  Location: [facility];  Service: Gynecology;  Laterality: N/A;  with Fulguration of Endometriosis and Peritoneal Biopsy    TONSILLECTOMY     TUBAL LIGATION  [id number]   UMBILICAL HERNIA REPAIR  10/2010    Family History  Problem Relation Age of Onset   Alzheimer's disease Father    Hyperlipidemia Mother    Migraines Mother    Allergic rhinitis Mother    Heart disease Other        paternal family   Diabetes Paternal Uncle    Hyperlipidemia Sister    Colon cancer Neg Hx    Breast cancer Neg Hx    Esophageal cancer Neg Hx    Rectal cancer Neg Hx    Stomach cancer Neg Hx     Social History   Tobacco Use   Smoking status: Never   Smokeless tobacco: Never  Vaping Use   Vaping Use: Never used  Substance Use Topics   Alcohol use: Yes    Comment: rarely    Drug use: No     Allergies  Allergen Reactions   Morphine And Related Itching   Mobic [Meloxicam] Other (See Comments)    bloating    Review of Systems NEGATIVE UNLESS OTHERWISE INDICATED IN HPI      Objective:     BP 109/74   Pulse 89   Temp 97.8 F (36.6 C) (Temporal)   Ht 4\' 11"  (1.499 m)   Wt 139 lb (63 kg)   SpO2 98%   BMI 28.07 kg/m   Wt  Readings from Last 3 Encounters:  07/16/22 139 lb (63 kg)  06/01/22 139 lb 6.4 oz (63.2 kg)  03/04/22 139 lb 9.6 oz (63.3 kg)    BP Readings from Last 3 Encounters:  07/16/22 109/74  06/01/22 120/68  03/04/22 102/78     Physical Exam Vitals and nursing note reviewed.  Constitutional:      Appearance: Normal appearance. She is normal weight. She is not toxic-appearing.  HENT:     Head: Normocephalic and atraumatic.     Right Ear: Tympanic membrane, ear canal and external ear normal.     Left Ear: Tympanic membrane, ear canal and external ear normal.     Nose: Nose normal.     Mouth/Throat:     Mouth: Mucous membranes are moist.  Eyes:     Extraocular Movements: Extraocular movements intact.     Conjunctiva/sclera: Conjunctivae normal.     Pupils: Pupils are equal, round, and reactive to light.  Cardiovascular:     Rate and Rhythm: Normal rate and regular rhythm.      Pulses: Normal pulses.     Heart sounds: Normal heart sounds.  Pulmonary:     Effort: Pulmonary effort is normal.     Breath sounds: Normal breath sounds.  Abdominal:     General: Abdomen is flat. Bowel sounds are normal.     Palpations: Abdomen is soft. There is no mass.     Tenderness: There is no right CVA tenderness, left CVA tenderness or guarding.  Musculoskeletal:        General: Normal range of motion.     Cervical back: Normal range of motion and neck supple.     Right lower leg: No edema.     Left lower leg: No edema.  Skin:    General: Skin is warm and dry.     Comments: Melanocytic nevus R hip, unchanged her entire life per patient  Neurological:     General: No focal deficit present.     Mental Status: She is alert and oriented to person, place, and time.  Psychiatric:        Mood and Affect: Mood normal.        Behavior: Behavior normal.        Thought Content: Thought content normal.        Judgment: Judgment normal.        Assessment & Plan:  Encounter for annual physical exam Assessment & Plan: Age-appropriate screening and counseling performed today. Will check labs and call with results. Preventive measures discussed and printed in AVS for patient.  Update lipid panel and HA1c today. Mammo ordered.   Patient Counseling: [x]   Nutrition: Stressed importance of moderation in sodium/caffeine intake, saturated fat and cholesterol, caloric balance, sufficient intake of fresh fruits, vegetables, and fiber.  [x]   Stressed the importance of regular exercise.   []   Substance Abuse: Discussed cessation/primary prevention of tobacco, alcohol, or other drug use; driving or other dangerous activities under the influence; availability of treatment for abuse.   []   Injury prevention: Discussed safety belts, safety helmets, smoke detector, smoking near bedding or upholstery.   []   Sexuality: Discussed sexually transmitted diseases, partner selection, use of condoms, avoidance  of unintended pregnancy  and contraceptive alternatives.   [x]   Dental health: Discussed importance of regular tooth brushing, flossing, and dental visits.  [x]   Health maintenance and immunizations reviewed. Please refer to Health maintenance section.      Orders: -     Digital  Screening Mammogram, Left and Right; Future -     Lipid panel -     Hemoglobin A1c  Chronic RLQ pain Assessment & Plan: Vast workup completed so far. Urogyn consult coming up next month.    Mixed hyperlipidemia -     Lipid panel  Diabetes mellitus screening -     Hemoglobin A1c       Return in about 1 year (around 07/17/2023) for CPE, fasting labs.  This note was prepared with assistance of Systems analyst. Occasional wrong-word or sound-a-like substitutions may have occurred due to the inherent limitations of voice recognition software.    Harlei Lehrmann M Gladis Soley, PA-C

## 2022-07-16 NOTE — Assessment & Plan Note (Signed)
Age-appropriate screening and counseling performed today. Will check labs and call with results. Preventive measures discussed and printed in AVS for patient.  Update lipid panel and HA1c today. Mammo ordered.   Patient Counseling: [x]   Nutrition: Stressed importance of moderation in sodium/caffeine intake, saturated fat and cholesterol, caloric balance, sufficient intake of fresh fruits, vegetables, and fiber.  [x]   Stressed the importance of regular exercise.   []   Substance Abuse: Discussed cessation/primary prevention of tobacco, alcohol, or other drug use; driving or other dangerous activities under the influence; availability of treatment for abuse.   []   Injury prevention: Discussed safety belts, safety helmets, smoke detector, smoking near bedding or upholstery.   []   Sexuality: Discussed sexually transmitted diseases, partner selection, use of condoms, avoidance of unintended pregnancy  and contraceptive alternatives.   [x]   Dental health: Discussed importance of regular tooth brushing, flossing, and dental visits.  [x]   Health maintenance and immunizations reviewed. Please refer to Health maintenance section.

## 2022-07-21 ENCOUNTER — Encounter: Payer: Self-pay | Admitting: Physician Assistant

## 2022-07-21 ENCOUNTER — Encounter: Payer: Self-pay | Admitting: Obstetrics and Gynecology

## 2022-07-21 ENCOUNTER — Other Ambulatory Visit (HOSPITAL_COMMUNITY)
Admission: RE | Admit: 2022-07-21 | Discharge: 2022-07-21 | Disposition: A | Discharge status: Home / Self Care | Payer: BC Managed Care – PPO | Attending: Obstetrics and Gynecology | Admitting: Obstetrics and Gynecology

## 2022-07-21 ENCOUNTER — Ambulatory Visit (INDEPENDENT_AMBULATORY_CARE_PROVIDER_SITE_OTHER): Payer: BC Managed Care – PPO | Admitting: Obstetrics and Gynecology

## 2022-07-21 VITALS — BP 116/78 | HR 93 | Ht <= 58 in | Wt 140.0 lb

## 2022-07-21 DIAGNOSIS — R319 Hematuria, unspecified: Secondary | ICD-10-CM | POA: Insufficient documentation

## 2022-07-21 DIAGNOSIS — N809 Endometriosis, unspecified: Secondary | ICD-10-CM | POA: Diagnosis not present

## 2022-07-21 DIAGNOSIS — N3010 Interstitial cystitis (chronic) without hematuria: Secondary | ICD-10-CM | POA: Diagnosis not present

## 2022-07-21 DIAGNOSIS — R350 Frequency of micturition: Secondary | ICD-10-CM | POA: Diagnosis not present

## 2022-07-21 LAB — URINALYSIS, ROUTINE W REFLEX MICROSCOPIC
Bilirubin Urine: NEGATIVE
Glucose, UA: NEGATIVE mg/dL
Ketones, ur: NEGATIVE mg/dL
Leukocytes,Ua: NEGATIVE
Nitrite: NEGATIVE
Protein, ur: NEGATIVE mg/dL
Specific Gravity, Urine: 1.005 (ref 1.005–1.030)
pH: 7 (ref 5.0–8.0)

## 2022-07-21 LAB — POCT URINALYSIS DIPSTICK
Bilirubin, UA: NEGATIVE
Glucose, UA: NEGATIVE
Ketones, UA: NEGATIVE
Leukocytes, UA: NEGATIVE
Nitrite, UA: NEGATIVE
Protein, UA: NEGATIVE
Spec Grav, UA: 1.015 (ref 1.010–1.025)
Urobilinogen, UA: 0.2 E.U./dL
pH, UA: 7 (ref 5.0–8.0)

## 2022-07-21 NOTE — Progress Notes (Unsigned)
[name]  Referring Provider: Allwardt, Randa Evens, PA-C PCP: Allwardt, Randa Evens, PA-C Date of Service: 07/21/2022  SUBJECTIVE Chief Complaint: New Patient (Initial Visit) Sierra Hodge is a 46 y.o. female here for a consult for right side pelvic pain./)  History of Present Illness: Sierra Hodge is a 46 y.o.  hispanic  female presenting evaluation of pelvic pain.    Review of records significant for: Has seen Dr Amalia Hailey at [facility] for possible IC. She was taking hydoxyzine, singluair and baclofen and recently added amitriptyline. Has also seen pelvic physical therapy.  Has seen GI and was diagnosed with diverticulosis.   CT Abdomen and pelvis 06/01/22, IMPRESSION: 1. No acute abnormality  in the abdomen or pelvis. 2. Moderate volume of formed stool in the right hemicolon may reflect constipation/slow transit. 3. Scattered left-sided colonic diverticulosis without findings of acute diverticulitis.  On 03/29/2012, had lysis of adhesions laparoscopically and biopsy of posterior cul de sac (Dr Leo Grosser). Surgical path from that surgery confirmed endometriosis. During that procedure had a cystoscopy with hydrodistention (with Dr Lorra Hals). Diffuse glomerulations were seen with no ulcerations. Capacity was 85ml.   [facility] is at [facility].   Urinary Symptoms: Leaks urine with cough/ sneeze Does not leak all the time, only when she sneezes too strong.  Pad use: 2 liners/ mini-pads per day.   She is not bothered by her UI symptoms.  Day time voids- every 2 hours or less.  Nocturia: 1-2 times per night to void. Voiding dysfunction: she does not empty her bladder well.  does not use a catheter to empty bladder.  When urinating, she feels the need to urinate multiple times in a row and to push on her belly or vagina to empty bladder Drinks: 1 cup coffee in AM, about 64 oz water, cammomile and detox tea per day Has to keep  her IC diet   UTIs:  0  UTI's in the last year.   Reports history of blood in urine  Pelvic Organ Prolapse Symptoms:                  She Denies a feeling of a bulge the vaginal area.   Bowel Symptom: Bowel movements: 1 time(s) per day, but sometimes gets constipated Stool consistency: soft  Straining: yes, sometimes Splinting: no.  Incomplete evacuation: no.  She Denies accidental bowel leakage / fecal incontinence Bowel regimen: none Last colonoscopy: Date 12/2020, Results- diverticulosis  Sexual Function Sexually active: yes.  Pain with sex: Yes, sometimes  Pelvic Pain Admits to pelvic pain Location: RLQ Pain occurs: comes and goes Prior pain treatment: amitriptyline 12.5mg , azo, hydroxyzine 10mg  daily, singulair daily, baclofen prn, Mirena IUD.  Has previously been on Elmiron but stopped because it was not helping and it was bothering her vision.  Improved by: current regimen, pelvic PT Worsened by: n/a  Previously had cysto with hydrodistention scheduled but her pain improved so she cancelled. She is not sure if the  hydrodistention in 2013 was helpful because she was in a lot of pain after.  She thinks she had a bladder instillation previously.   She reports that she has very tight pelvic muscles and is doing pelvic PT at [facility]. Pelvic exercises have been helping her overall.   Did not start having pelvic pain until the birth of her second daughter.    Past Medical History:  Past Medical History:  Diagnosis Date   Asthma    Chronic  interstitial cystitis    Complication of anesthesia    hard to wake after anesthesia   Endometriosis    Fatty liver    Headache    Hyperlipidemia    IBS (irritable bowel syndrome)    Ileitis    IUD (intrauterine device) in place    Mirena, Dr. Leo Grosser   NAFLD (nonalcoholic fatty liver disease)    Other psoriasis      Past Surgical History:   Past Surgical History:  Procedure Laterality Date   CESAREAN SECTION      2008, 2010   CHOLECYSTECTOMY N/A 07/07/2017   Procedure: LAPAROSCOPIC CHOLECYSTECTOMY;  Surgeon: Coralie Keens, MD;  Location: [facility] [place];  Service: General;  Laterality: N/A;   CYSTO WITH HYDRODISTENSION  03/29/2012   Procedure: CYSTOSCOPY/HYDRODISTENSION;  Surgeon: Reece Packer, MD;  Location: [facility];  Service: Urology;  Laterality: N/A;  with Instillation of Marcaine and Pyridium    ESOPHAGOGASTRODUODENOSCOPY  2018   LAPAROSCOPY  03/29/2012   Procedure: LAPAROSCOPY OPERATIVE;  Surgeon: Eldred Manges, MD;  Location: [facility];  Service: Gynecology;  Laterality: N/A;  with Fulguration of Endometriosis and Peritoneal Biopsy   TONSILLECTOMY     TUBAL LIGATION  1027   UMBILICAL HERNIA REPAIR  10/2010     Past OB/GYN History: OB History     Gravida  2   Para  2   Term  2   Preterm      AB      Living  2      SAB      IAB      Ectopic      Multiple      Live Births  2          Cesarean section: 2 No LMP recorded. (Menstrual status: IUD).- Mirena Contraception: tubal ligation 2010. Has a history of LEEP (2002?)- pap smears have been normal since then.    Medications: She has a current medication list which includes the following prescription(s): albuterol, baclofen, calcium glycerophosphate, chloridazepoxide-amitriptyline, clobetasol ointment, famotidine, hydroxyzine, levonorgestrel, montelukast, and triamcinolone ointment, and the following Facility-Administered Medications: gadopentetate dimeglumine.   Allergies: Patient is allergic to morphine and related and mobic [meloxicam].   Social History:  Social History   Tobacco Use   Smoking status: Never   Smokeless tobacco: Never  Vaping Use   Vaping Use: Never used  Substance Use Topics   Alcohol use: Yes    Comment: rarely    Drug use: No    Relationship status: married She lives with husband, daughters and dog.   She is employed as Nutritional therapist. Regular  exercise: Yes: walks 25 min daily History of abuse: No  Family History:   Family History  Problem Relation Age of Onset   Hyperlipidemia Mother    Allergic rhinitis Mother    Alzheimer's disease Father    Hyperlipidemia Sister    Diabetes Paternal Uncle    Heart disease Other        paternal family   Colon cancer Neg Hx    Breast cancer Neg Hx    Esophageal cancer Neg Hx    Rectal cancer Neg Hx    Stomach cancer Neg Hx      Review of Systems: Review of Systems  Constitutional:  Negative for fever, malaise/fatigue and weight loss.  Respiratory:  Negative for cough, shortness of breath and wheezing.   Cardiovascular:  Negative for chest pain, palpitations and leg swelling.  Gastrointestinal:  Positive for abdominal  pain. Negative for blood in stool.  Genitourinary:  Negative for dysuria.  Musculoskeletal:  Negative for myalgias.  Skin:  Negative for rash.  Neurological:  Negative for dizziness and headaches.  Endo/Heme/Allergies:  Does not bruise/bleed easily.  Psychiatric/Behavioral:  Negative for depression. The patient is not nervous/anxious.      OBJECTIVE Physical Exam: Vitals:   07/21/22 0943  BP: 116/78  Pulse: 93  Weight: 140 lb (63.5 kg)  Height: 4\' 10"  (1.473 m)    Physical Exam Constitutional:      General: She is not in acute distress. Pulmonary:     Effort: Pulmonary effort is normal.  Abdominal:     General: There is no distension.     Palpations: Abdomen is soft.     Tenderness: There is no abdominal tenderness. There is no rebound.  Musculoskeletal:        General: No swelling. Normal range of motion.  Skin:    General: Skin is warm and dry.     Findings: No rash.  Neurological:     Mental Status: She is alert and oriented to person, place, and time.  Psychiatric:        Mood and Affect: Mood normal.        Behavior: Behavior normal.      GU / Detailed Urogynecologic Evaluation:  Pelvic Exam: Normal external female genitalia;  Bartholin's and Skene's glands normal in appearance; urethral meatus normal in appearance, no urethral masses or discharge.   CST: negative  Speculum exam reveals normal vaginal mucosa without atrophy. Cervix normal appearance. Uterus normal single, nontender. Adnexa no mass, fullness, tenderness.     Pelvic floor strength II/V  Pelvic floor musculature: Right levator non-tender, Right obturator non-tender, Left levator non-tender, Left obturator non-tender  POP-Q:  No prolapse noted   Rectal Exam:  Normal external rectum  Post-Void Residual (PVR) by Bladder Scan: In order to evaluate bladder emptying, we discussed obtaining a postvoid residual and she agreed to this procedure.  Procedure: The ultrasound unit was placed on the patient's abdomen in the suprapubic region after the patient had voided. A PVR of 32 ml was obtained by bladder scan.  Laboratory Results: POC urine: small blood   ASSESSMENT AND PLAN Ms. Phon is a 46 y.o. with:  1. Urinary frequency   2. Hematuria, unspecified type   3. Endometriosis    - referral to [facility] - cysto in office   Jaquita Folds, MD   Medical Decision Making:  - Reviewed/ ordered a clinical laboratory test - Reviewed/ ordered a radiologic study - Reviewed/ ordered medicine test - Decision to obtain old records - Discussion of management of or test interpretation with an external physician / other healthcare professional  - Assessment requiring independent historian - Review and summation of prior records - Independent review of image, tracing or specimen

## 2022-07-22 LAB — URINE CULTURE: Culture: NO GROWTH

## 2022-07-22 NOTE — Telephone Encounter (Signed)
Please see pt msg and advise 

## 2022-07-27 DIAGNOSIS — M6289 Other specified disorders of muscle: Secondary | ICD-10-CM | POA: Diagnosis not present

## 2022-07-27 DIAGNOSIS — M6281 Muscle weakness (generalized): Secondary | ICD-10-CM | POA: Diagnosis not present

## 2022-07-27 DIAGNOSIS — R102 Pelvic and perineal pain: Secondary | ICD-10-CM | POA: Diagnosis not present

## 2022-07-27 DIAGNOSIS — M62838 Other muscle spasm: Secondary | ICD-10-CM | POA: Diagnosis not present

## 2022-08-04 DIAGNOSIS — N930 Postcoital and contact bleeding: Secondary | ICD-10-CM | POA: Diagnosis not present

## 2022-08-04 DIAGNOSIS — N951 Menopausal and female climacteric states: Secondary | ICD-10-CM | POA: Diagnosis not present

## 2022-08-04 DIAGNOSIS — R6882 Decreased libido: Secondary | ICD-10-CM | POA: Diagnosis not present

## 2022-08-05 DIAGNOSIS — M6289 Other specified disorders of muscle: Secondary | ICD-10-CM | POA: Diagnosis not present

## 2022-08-05 DIAGNOSIS — M62838 Other muscle spasm: Secondary | ICD-10-CM | POA: Diagnosis not present

## 2022-08-05 DIAGNOSIS — R102 Pelvic and perineal pain: Secondary | ICD-10-CM | POA: Diagnosis not present

## 2022-08-05 DIAGNOSIS — M6281 Muscle weakness (generalized): Secondary | ICD-10-CM | POA: Diagnosis not present

## 2022-08-09 DIAGNOSIS — Z1231 Encounter for screening mammogram for malignant neoplasm of breast: Secondary | ICD-10-CM | POA: Diagnosis not present

## 2022-08-16 LAB — HM MAMMOGRAPHY

## 2022-08-19 ENCOUNTER — Encounter: Payer: Self-pay | Admitting: Physician Assistant

## 2022-08-27 DIAGNOSIS — N898 Other specified noninflammatory disorders of vagina: Secondary | ICD-10-CM | POA: Diagnosis not present

## 2022-08-27 DIAGNOSIS — N930 Postcoital and contact bleeding: Secondary | ICD-10-CM | POA: Diagnosis not present

## 2022-08-27 DIAGNOSIS — R102 Pelvic and perineal pain: Secondary | ICD-10-CM | POA: Diagnosis not present

## 2022-09-23 ENCOUNTER — Encounter: Payer: Self-pay | Admitting: Obstetrics and Gynecology

## 2022-09-23 ENCOUNTER — Ambulatory Visit: Payer: BC Managed Care – PPO | Admitting: Obstetrics and Gynecology

## 2022-09-23 VITALS — BP 108/67 | HR 93

## 2022-09-23 DIAGNOSIS — R311 Benign essential microscopic hematuria: Secondary | ICD-10-CM | POA: Diagnosis not present

## 2022-09-23 DIAGNOSIS — R350 Frequency of micturition: Secondary | ICD-10-CM | POA: Diagnosis not present

## 2022-09-23 LAB — POCT URINALYSIS DIPSTICK
Bilirubin, UA: NEGATIVE
Glucose, UA: NEGATIVE
Ketones, UA: NEGATIVE
Leukocytes, UA: NEGATIVE
Nitrite, UA: NEGATIVE
Protein, UA: NEGATIVE
Spec Grav, UA: 1.01 (ref 1.010–1.025)
Urobilinogen, UA: 0.2 E.U./dL
pH, UA: 6.5 (ref 5.0–8.0)

## 2022-09-23 MED ORDER — LIDOCAINE HCL URETHRAL/MUCOSAL 2 % EX GEL
1.0000 | Freq: Once | CUTANEOUS | Status: AC
  Administered 2022-09-23: 1 via URETHRAL

## 2022-09-23 NOTE — Patient Instructions (Signed)
Taking Care of Yourself after Urodynamics, Cystoscopy, Bulkamid Injection, or Botox Injection   Drink plenty of water for a day or two following your procedure. Try to have about 8 ounces (one cup) at a time, and do this 6 times or more per day unless you have fluid restrictitons AVOID irritative beverages such as coffee, tea, soda, alcoholic or citrus drinks for a day or two, as this may cause burning with urination.  For the first 1-2 days after the procedure, your urine may be pink or red in color. You may have some blood in your urine as a normal side effect of the procedure. Large amounts of bleeding or difficulty urinating are NOT normal. Call the nurse line if this happens or go to the nearest Emergency Room if the bleeding is heavy or you cannot urinate at all and it is after hours.  You may experience some discomfort or a burning sensation with urination after having this procedure. You can use over the counter Azo or pyridium to help with burning and follow the instructions on the packaging. If it does not improve within 1-2 days, or other symptoms appear (fever, chills, or difficulty urinating) call the office to speak to a nurse.  You may return to normal daily activities such as work, school, driving, exercising and housework on the day of the procedure.     

## 2022-09-23 NOTE — Addendum Note (Signed)
Addended by: Elita Quick on: 09/23/2022 10:03 AM   Modules accepted: Orders

## 2022-09-23 NOTE — Progress Notes (Signed)
CYSTOSCOPY  CC:  This is a 47 y.o. with microscopic hematuria who presents today for cystoscopy. She reports that she has been having some SUI recently due to coughing from a cold. Has previously done PT at [facility].   POC urine: small blood  BP 108/67   Pulse 93   CYSTOSCOPY: A time out was performed.  The periurethral area was prepped and draped in a sterile manner.  2% lidocaine jetpack was inserted at the urethral meatus and the urethra and bladder visualized with 70-degree scopes.  She had normal urethral coaptation and normal urethral mucosa.  She had normal bladder mucosa. She had bilateral clear efflux from both ureteral orifices. Mild squamous metaplasia near the right UO. No trabeculations, cellules or diverticuli present    ASSESSMENT:  47 y.o. with microscopic hematuria. Cystoscopy today is normal.  PLAN:  - Prior CT w/ contrast 05/2022 did not demonstrate any renal abnormalities - cystoscopy today is normal - continue treatment for IC - Recommended she return to pelvic PT for SUI symptoms   Return 6 months or sooner if needed  Jaquita Folds, MD

## 2022-10-11 ENCOUNTER — Encounter: Payer: Self-pay | Admitting: Obstetrics and Gynecology

## 2022-10-11 ENCOUNTER — Ambulatory Visit: Payer: BC Managed Care – PPO | Admitting: Obstetrics and Gynecology

## 2022-10-11 ENCOUNTER — Other Ambulatory Visit: Payer: Self-pay

## 2022-10-11 VITALS — BP 105/74 | HR 84 | Ht 59.0 in | Wt 143.0 lb

## 2022-10-11 DIAGNOSIS — N809 Endometriosis, unspecified: Secondary | ICD-10-CM

## 2022-10-11 DIAGNOSIS — R1031 Right lower quadrant pain: Secondary | ICD-10-CM

## 2022-10-11 DIAGNOSIS — N898 Other specified noninflammatory disorders of vagina: Secondary | ICD-10-CM | POA: Diagnosis not present

## 2022-10-11 DIAGNOSIS — N3010 Interstitial cystitis (chronic) without hematuria: Secondary | ICD-10-CM

## 2022-10-11 DIAGNOSIS — G8929 Other chronic pain: Secondary | ICD-10-CM

## 2022-10-11 NOTE — Progress Notes (Signed)
GYNECOLOGY VISIT  Patient name: Sierra Hodge MRN [id number]  Date of birth: [birth year] Chief Complaint:   Gynecologic Exam   History:  Sierra Hodge is a 47 y.o. G2P2002 being seen today for abdominal pain.  Sent by Wannetta Sender for RLQ. Has seen GI and has had negative colonoscopy. Gyn did [place] an ddidn't find anything. Urogyn did cysto which was negative. Taking amitryptiline has been helping with pain. Pain in RLQ is intermittent - sharp, may go to [employer] PFPT has made the pain go away, theory is muslce pain and improved w/ massage. Worse with constipation and has been more than usual. Pelvic floor is fine and so stopped seeing. Ibuprofen helps.   Can be hard to have BM from time to time . Prn dulcolax   Using vaginal moisturizer - reports scared to use vaginal estrogen due to concern of breast cancer from what she has read.   Past Medical History:  Diagnosis Date   Asthma    Chronic interstitial cystitis    Complication of anesthesia    hard to wake after anesthesia   Endometriosis    Fatty liver    Headache    Hyperlipidemia    IBS (irritable bowel syndrome)    Ileitis    IUD (intrauterine device) in place    Mirena, Dr. Leo Grosser   NAFLD (nonalcoholic fatty liver disease)    Other psoriasis     Past Surgical History:  Procedure Laterality Date   CESAREAN SECTION     2008, 2010   CHOLECYSTECTOMY N/A 07/07/2017   Procedure: LAPAROSCOPIC CHOLECYSTECTOMY;  Surgeon: Coralie Keens, MD;  Location: [facility];  Service: General;  Laterality: N/A;   CYSTO WITH HYDRODISTENSION  03/29/2012   Procedure: CYSTOSCOPY/HYDRODISTENSION;  Surgeon: Reece Packer, MD;  Location: [facility];  Service: Urology;  Laterality: N/A;  with Instillation of Marcaine and Pyridium    ESOPHAGOGASTRODUODENOSCOPY  2018   LAPAROSCOPY  03/29/2012   Procedure: LAPAROSCOPY OPERATIVE;  Surgeon: Eldred Manges, MD;  Location: [facility];  Service: Gynecology;  Laterality: N/A;  with  Fulguration of Endometriosis and Peritoneal Biopsy   TONSILLECTOMY     TUBAL LIGATION  5027   UMBILICAL HERNIA REPAIR  10/2010    The following portions of the patient's history were reviewed and updated as appropriate: allergies, current medications, past family history, past medical history, past social history, past surgical history and problem list.   Health Maintenance:   Last pap NONE SEEN  Last mammogram: 05/2021   Review of Systems:  Pertinent items are noted in HPI. Comprehensive review of systems was otherwise negative.   Objective:  Physical Exam BP 105/74   Pulse 84   Ht 4\' 11"  (1.499 m)   Wt 143 lb (64.9 kg)   BMI 28.88 kg/m    Physical Exam Vitals and nursing note reviewed.  Constitutional:      Appearance: Normal appearance.  HENT:     Head: Normocephalic and atraumatic.  Pulmonary:     Effort: Pulmonary effort is normal.  Abdominal:     Palpations: Abdomen is soft.     Comments: No allodynia Well healed incisions RLQ tenderness, + carnett sign No rebound/guarding   Skin:    General: Skin is warm and dry.  Neurological:     General: No focal deficit present.     Mental Status: She is alert.  Psychiatric:        Mood and Affect: Mood normal.  Behavior: Behavior normal.        Thought Content: Thought content normal.        Judgment: Judgment normal.      Labs and Imaging CT Abdomen Pelvis W Contrast CLINICAL DATA:  Chronic right lower quadrant abdominal pain.  EXAM: CT ABDOMEN AND PELVIS WITH CONTRAST  TECHNIQUE: Multidetector CT imaging of the abdomen and pelvis was performed using the standard protocol following bolus administration of intravenous contrast.  RADIATION DOSE REDUCTION: This exam was performed according to the departmental dose-optimization program which includes automated exposure control, adjustment of the mA and/or kV according to patient size and/or use of iterative reconstruction technique.  CONTRAST:   172mL OMNIPAQUE IOHEXOL 300 MG/ML  SOLN  COMPARISON:  CT December 16, 2020 .  FINDINGS: Lower chest: No acute abnormality.  Hepatobiliary: Subcentimeter hypodense lesion in the left lobe of the liver measures 5 mm technically too small to accurately characterize but statistically likely reflect a benign cyst or hemangioma. Hypodense focus in segment IV along the falciform ligament is in an area commonly reflecting focal fatty infiltration or differential perfusion (veins of [place]). Gallbladder surgically absent. No biliary ductal dilation.  Pancreas: No pancreatic ductal dilation or evidence of acute inflammation.  Spleen: No splenomegaly or focal splenic lesion.  Adrenals/Urinary Tract: Bilateral adrenal glands appear normal. No hydronephrosis. Kidneys demonstrate symmetric enhancement. Urinary bladder is unremarkable for degree of distension.  Stomach/Bowel: Radiopaque enteric contrast material traverses the terminal ileum. Stomach is unremarkable for degree of distension. No pathologic dilation of small or large bowel. Appendix is not identified however there is no evidence of acute appendicitis. Terminal ileum appears normal. Moderate volume of formed stool in the proximal colon. Scattered left-sided colonic diverticulosis.  Vascular/Lymphatic: Normal caliber abdominal aorta. No pathologically enlarged abdominal or pelvic lymph nodes.  Reproductive: Intrauterine device appears appropriate in positioning. No suspicious adnexal mass.  Other: No significant abdominopelvic free fluid.  Musculoskeletal: No acute osseous abnormality.  IMPRESSION: 1. No acute abnormality  in the abdomen or pelvis. 2. Moderate volume of formed stool in the right hemicolon may reflect constipation/slow transit. 3. Scattered left-sided colonic diverticulosis without findings of acute diverticulitis.  Electronically Signed   By: Dahlia Bailiff M.D.   On: 06/01/2022 16:37       Assessment  & Plan:   1. Chronic RLQ pain Likely muscle related pain/spasm. Encourage use of muscle relaxer and return to PFPT. Avoid constipation   2. Endometriosis IUD in place and working well. No signs of extraperitoneal endo on CT A/P  3. Interstitial cystitis Continue follow up with urogyn and IC diet  4. Vaginal dryness Reviewed options for management including vaginal moisturizer, coconut oil, and vaginal estrogen. Doing well with moisturizer. Reviewed estrogen safe as well.    Routine preventative health maintenance measures emphasized.  Darliss Cheney, MD [facility] [place]

## 2022-10-11 NOTE — Patient Instructions (Addendum)
Tens unit to help with muscle pain  Use your muscle relaxer as night as needed  Continue vaginal moisturizer   Go back to physical therapy to help with abdominal wall pain/myalgia

## 2022-11-08 ENCOUNTER — Encounter: Payer: Self-pay | Admitting: *Deleted

## 2022-11-15 ENCOUNTER — Encounter: Payer: Self-pay | Admitting: Family

## 2022-11-15 ENCOUNTER — Ambulatory Visit: Payer: BC Managed Care – PPO | Admitting: Family

## 2022-11-15 VITALS — BP 111/70 | HR 83 | Temp 97.8°F | Ht 59.0 in | Wt 110.1 lb

## 2022-11-15 DIAGNOSIS — R21 Rash and other nonspecific skin eruption: Secondary | ICD-10-CM

## 2022-11-15 DIAGNOSIS — L409 Psoriasis, unspecified: Secondary | ICD-10-CM

## 2022-11-15 MED ORDER — METHYLPREDNISOLONE ACETATE 40 MG/ML IJ SUSP
60.0000 mg | Freq: Once | INTRAMUSCULAR | Status: AC
  Administered 2022-11-15: 60 mg via INTRAMUSCULAR

## 2022-11-15 MED ORDER — CLOBETASOL PROPIONATE 0.05 % EX OINT: TOPICAL_OINTMENT | Freq: Every day | CUTANEOUS | 5 refills | Status: DC | PRN

## 2022-11-15 NOTE — Progress Notes (Signed)
Patient ID: Sierra Hodge, female    DOB: 1975/12/04, 47 y.o.   MRN: [id number]  Chief Complaint  Patient presents with   Rash    sx for 3d    HPI:      Skin rash:  Pt c/o rash on bilateral arms for about 3 days after Gardening. Has tried cortisone cream which and Hydroxyzine which are not helping. She describes as very Itchy, with redness and red bumps.     Assessment & Plan:  1. Skin rash - sending refill of clobetasol pt uses for her psoriasis.  advised can use just a small amount of this also on her poison oak rash on arms. Also giving DepoMedrol injection, advised on use & SE.  - clobetasol ointment (TEMOVATE) 0.05 %; Apply topically daily as needed.  Dispense: 60 g; Refill: 5 - methylPREDNISolone acetate (DEPO-MEDROL) injection 60 mg  2. Psoriasis -  - clobetasol ointment (TEMOVATE) 0.05 %; Apply topically daily as needed.  Dispense: 60 g; Refill: 5   Subjective:    Outpatient Medications Prior to Visit  Medication Sig Dispense Refill   albuterol (VENTOLIN HFA) 108 (90 Base) MCG/ACT inhaler Inhale 2 puffs into the lungs every 6 (six) hours as needed for wheezing or shortness of breath. 18 g 1   baclofen (LIORESAL) 10 MG tablet Take 10 mg by mouth 3 (three) times daily.     Calcium Glycerophosphate (PRELIEF PO) Take by mouth. Before meals     chloridazePOXIDE-amitriptyline (LIMBITROL) 5-12.5 MG tablet Take 1 tablet by mouth 2 (two) times daily.     famotidine (PEPCID) 10 MG tablet Take 20 mg by mouth 3 (three) times daily before meals.     hydrOXYzine (ATARAX) 10 MG tablet Take 1 tablet by mouth at bedtime.     levonorgestrel (MIRENA) 20 MCG/24HR IUD 1 each by Intrauterine route once.     montelukast (SINGULAIR) 10 MG tablet Take 1 tablet by mouth daily.     triamcinolone ointment (KENALOG) 0.1 % Use 1 application sparingly twice a day as needed to red itchy areas. Do not use on face, neck, groin, or armpit region 15 g 0   clobetasol ointment (TEMOVATE) 0.05 % Apply  topically daily as needed. 60 g 5   Facility-Administered Medications Prior to Visit  Medication Dose Route Frequency Provider Last Rate Last Admin   gadopentetate dimeglumine (MAGNEVIST) injection 13 mL  13 mL Intravenous Once PRN Melvenia Beam, MD       Past Medical History:  Diagnosis Date   Asthma    Chronic interstitial cystitis    Complication of anesthesia    hard to wake after anesthesia   Endometriosis    Fatty liver    Headache    Hyperlipidemia    IBS (irritable bowel syndrome)    Ileitis    IUD (intrauterine device) in place    Mirena, Dr. Leo Grosser   NAFLD (nonalcoholic fatty liver disease)    Other psoriasis    Past Surgical History:  Procedure Laterality Date   CESAREAN SECTION     2008, 2010   CHOLECYSTECTOMY N/A 07/07/2017   Procedure: LAPAROSCOPIC CHOLECYSTECTOMY;  Surgeon: Coralie Keens, MD;  Location: [facility];  Service: General;  Laterality: N/A;   CYSTO WITH HYDRODISTENSION  03/29/2012   Procedure: CYSTOSCOPY/HYDRODISTENSION;  Surgeon: Reece Packer, MD;  Location: [facility];  Service: Urology;  Laterality: N/A;  with Instillation of Marcaine and Pyridium    ESOPHAGOGASTRODUODENOSCOPY  2018   LAPAROSCOPY  03/29/2012  Procedure: LAPAROSCOPY OPERATIVE;  Surgeon: Eldred Manges, MD;  Location: [facility];  Service: Gynecology;  Laterality: N/A;  with Fulguration of Endometriosis and Peritoneal Biopsy   TONSILLECTOMY     TUBAL LIGATION  [id number]   UMBILICAL HERNIA REPAIR  10/2010   Allergies  Allergen Reactions   Morphine And Related Itching   Mobic [Meloxicam] Other (See Comments)    bloating      Objective:    Physical Exam BP 111/70 (BP Location: Left Arm, Patient Position: Sitting, Cuff Size: Large)   Pulse 83   Temp 97.8 F (36.6 C) (Temporal)   Ht '4\' 11"'$  (1.499 m)   Wt 110 lb 2 oz (50 kg)   LMP  (LMP Unknown)   SpO2 97%   BMI 22.24 kg/m  Wt Readings from Last 3 Encounters:  11/15/22 110 lb 2 oz (50 kg)  10/11/22  143 lb (64.9 kg)  07/21/22 140 lb (63.5 kg)       Jeanie Sewer, NP

## 2022-12-07 ENCOUNTER — Encounter: Payer: BC Managed Care – PPO | Admitting: Plastic Surgery

## 2022-12-24 DIAGNOSIS — R051 Acute cough: Secondary | ICD-10-CM | POA: Diagnosis not present

## 2022-12-24 DIAGNOSIS — J069 Acute upper respiratory infection, unspecified: Secondary | ICD-10-CM | POA: Diagnosis not present

## 2022-12-24 DIAGNOSIS — Z20822 Contact with and (suspected) exposure to covid-19: Secondary | ICD-10-CM | POA: Diagnosis not present

## 2022-12-28 ENCOUNTER — Ambulatory Visit (INDEPENDENT_AMBULATORY_CARE_PROVIDER_SITE_OTHER): Payer: Self-pay | Admitting: Plastic Surgery

## 2022-12-28 ENCOUNTER — Encounter: Payer: Self-pay | Admitting: Plastic Surgery

## 2022-12-28 VITALS — BP 104/70 | HR 78 | Ht 59.0 in | Wt 141.2 lb

## 2022-12-28 DIAGNOSIS — Z719 Counseling, unspecified: Secondary | ICD-10-CM | POA: Insufficient documentation

## 2022-12-28 NOTE — Progress Notes (Signed)
   Subjective:    Patient ID: Sierra Hodge, female    DOB: [birth year], 47 y.o.   MRN: [id number]  The patient is a very nice 47 year old female here for evaluation of her skin.  She is interested in laser hair removal under her arms and at her legs.  For the majority of those areas that hair is dark and her skin is light.  She is a [employer] 4 so we would need to go slow but I think she would have a very nice response.      Review of Systems  Constitutional: Negative.   Eyes: Negative.   Respiratory: Negative.    Cardiovascular: Negative.   Gastrointestinal: Negative.   Endocrine: Negative.   Genitourinary: Negative.   Musculoskeletal: Negative.        Objective:   Physical Exam Constitutional:      Appearance: Normal appearance.  Skin:    Capillary Refill: Capillary refill takes less than 2 seconds.  Neurological:     Mental Status: She is alert and oriented to person, place, and time.  Psychiatric:        Mood and Affect: Mood normal.        Behavior: Behavior normal.        Thought Content: Thought content normal.        Judgment: Judgment normal.       Assessment & Plan:     ICD-10-CM   1. Encounter for counseling  Z71.9       The patient is a good candidate for laser hair removal of the legs and axillary area.  We will provide her with a quote and she can think things over.  I explained that because of the hair cycle she will need multiple treatments over a year.

## 2022-12-31 ENCOUNTER — Other Ambulatory Visit: Payer: Self-pay

## 2022-12-31 ENCOUNTER — Other Ambulatory Visit (INDEPENDENT_AMBULATORY_CARE_PROVIDER_SITE_OTHER): Payer: BC Managed Care – PPO

## 2022-12-31 DIAGNOSIS — E7800 Pure hypercholesterolemia, unspecified: Secondary | ICD-10-CM

## 2022-12-31 LAB — LIPID PANEL
Cholesterol: 217 mg/dL — ABNORMAL HIGH (ref 0–200)
HDL: 48 mg/dL (ref >39.00)
LDL Cholesterol: 156 mg/dL — ABNORMAL HIGH (ref 0–99)
NonHDL: 169.25
Total CHOL/HDL Ratio: 5
Triglycerides: 65 mg/dL (ref 0.0–149.0)
VLDL: 13 mg/dL (ref 0.0–40.0)

## 2023-01-06 DIAGNOSIS — N803 Endometriosis of pelvic peritoneum, unspecified: Secondary | ICD-10-CM | POA: Diagnosis not present

## 2023-01-06 DIAGNOSIS — Z124 Encounter for screening for malignant neoplasm of cervix: Secondary | ICD-10-CM | POA: Diagnosis not present

## 2023-01-06 DIAGNOSIS — T8389XS Other specified complication of genitourinary prosthetic devices, implants and grafts, sequela: Secondary | ICD-10-CM | POA: Diagnosis not present

## 2023-01-06 DIAGNOSIS — Z01419 Encounter for gynecological examination (general) (routine) without abnormal findings: Secondary | ICD-10-CM | POA: Diagnosis not present

## 2023-01-06 DIAGNOSIS — Z01411 Encounter for gynecological examination (general) (routine) with abnormal findings: Secondary | ICD-10-CM | POA: Diagnosis not present

## 2023-01-06 DIAGNOSIS — Z6829 Body mass index (BMI) 29.0-29.9, adult: Secondary | ICD-10-CM | POA: Diagnosis not present

## 2023-01-07 ENCOUNTER — Encounter: Payer: Self-pay | Admitting: Internal Medicine

## 2023-01-07 IMAGING — DX DG ABDOMEN 1V
1 series · 1 of 1 positions shown · non-contrast
Comparison: CT 02/23/2019.

CLINICAL DATA: Abdominal pain.

EXAM:
ABDOMEN - 1 VIEW

[kub ap]
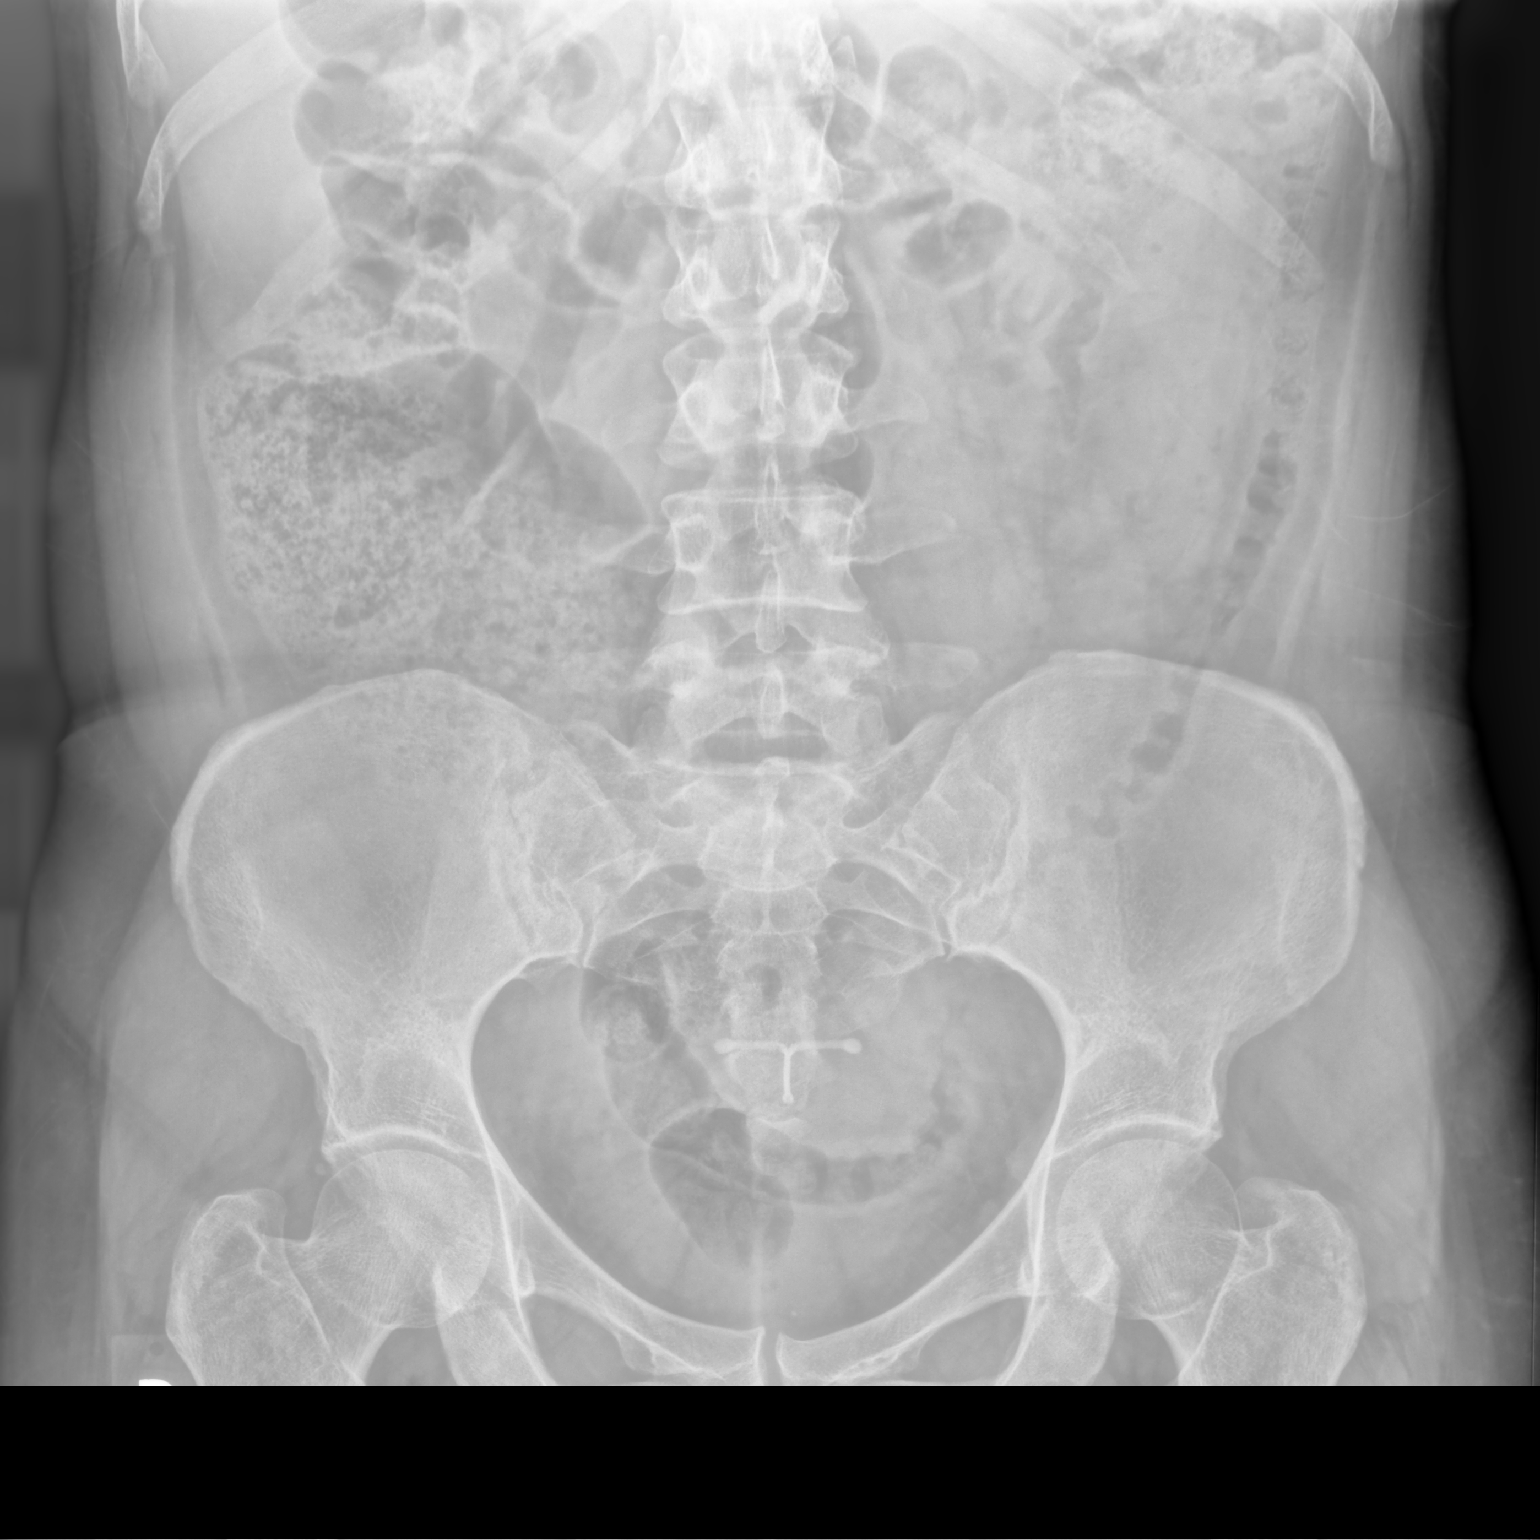

[1 of 1 positions shown; findings below may reference images not displayed]

FINDINGS: Soft tissue structures are unremarkable. Nonspecific nondilated
air-filled loops of small bowel noted. Stool noted throughout the
colon. Cecum is distended to 10 cm. Follow-up exam suggested
demonstrate resolution. IUD noted the pelvis. No acute bony
abnormality.
IMPRESSION: Nonspecific nondilated air-filled loops of small bowel noted. Stool
noted throughout the colon. Cecum is distended to 10 cm. Follow-up
exam suggested demonstrate resolution.

## 2023-01-18 ENCOUNTER — Encounter: Payer: Self-pay | Admitting: Physician Assistant

## 2023-01-18 ENCOUNTER — Ambulatory Visit: Payer: BC Managed Care – PPO | Admitting: Physician Assistant

## 2023-01-18 VITALS — BP 104/70 | HR 104 | Temp 98.0°F | Ht 59.0 in | Wt 141.6 lb

## 2023-01-18 DIAGNOSIS — Z83438 Family history of other disorder of lipoprotein metabolism and other lipidemia: Secondary | ICD-10-CM | POA: Diagnosis not present

## 2023-01-18 DIAGNOSIS — E782 Mixed hyperlipidemia: Secondary | ICD-10-CM | POA: Diagnosis not present

## 2023-01-18 DIAGNOSIS — G8929 Other chronic pain: Secondary | ICD-10-CM

## 2023-01-18 DIAGNOSIS — R1031 Right lower quadrant pain: Secondary | ICD-10-CM

## 2023-01-18 IMAGING — DX DG ABDOMEN 1V
1 series · 1 of 1 positions shown · non-contrast
Comparison: 11/10/2020 radiograph

CLINICAL DATA: Follow-up nonspecific bowel gas pattern and cecum
distension.

EXAM:
ABDOMEN - 1 VIEW

[kub ap]
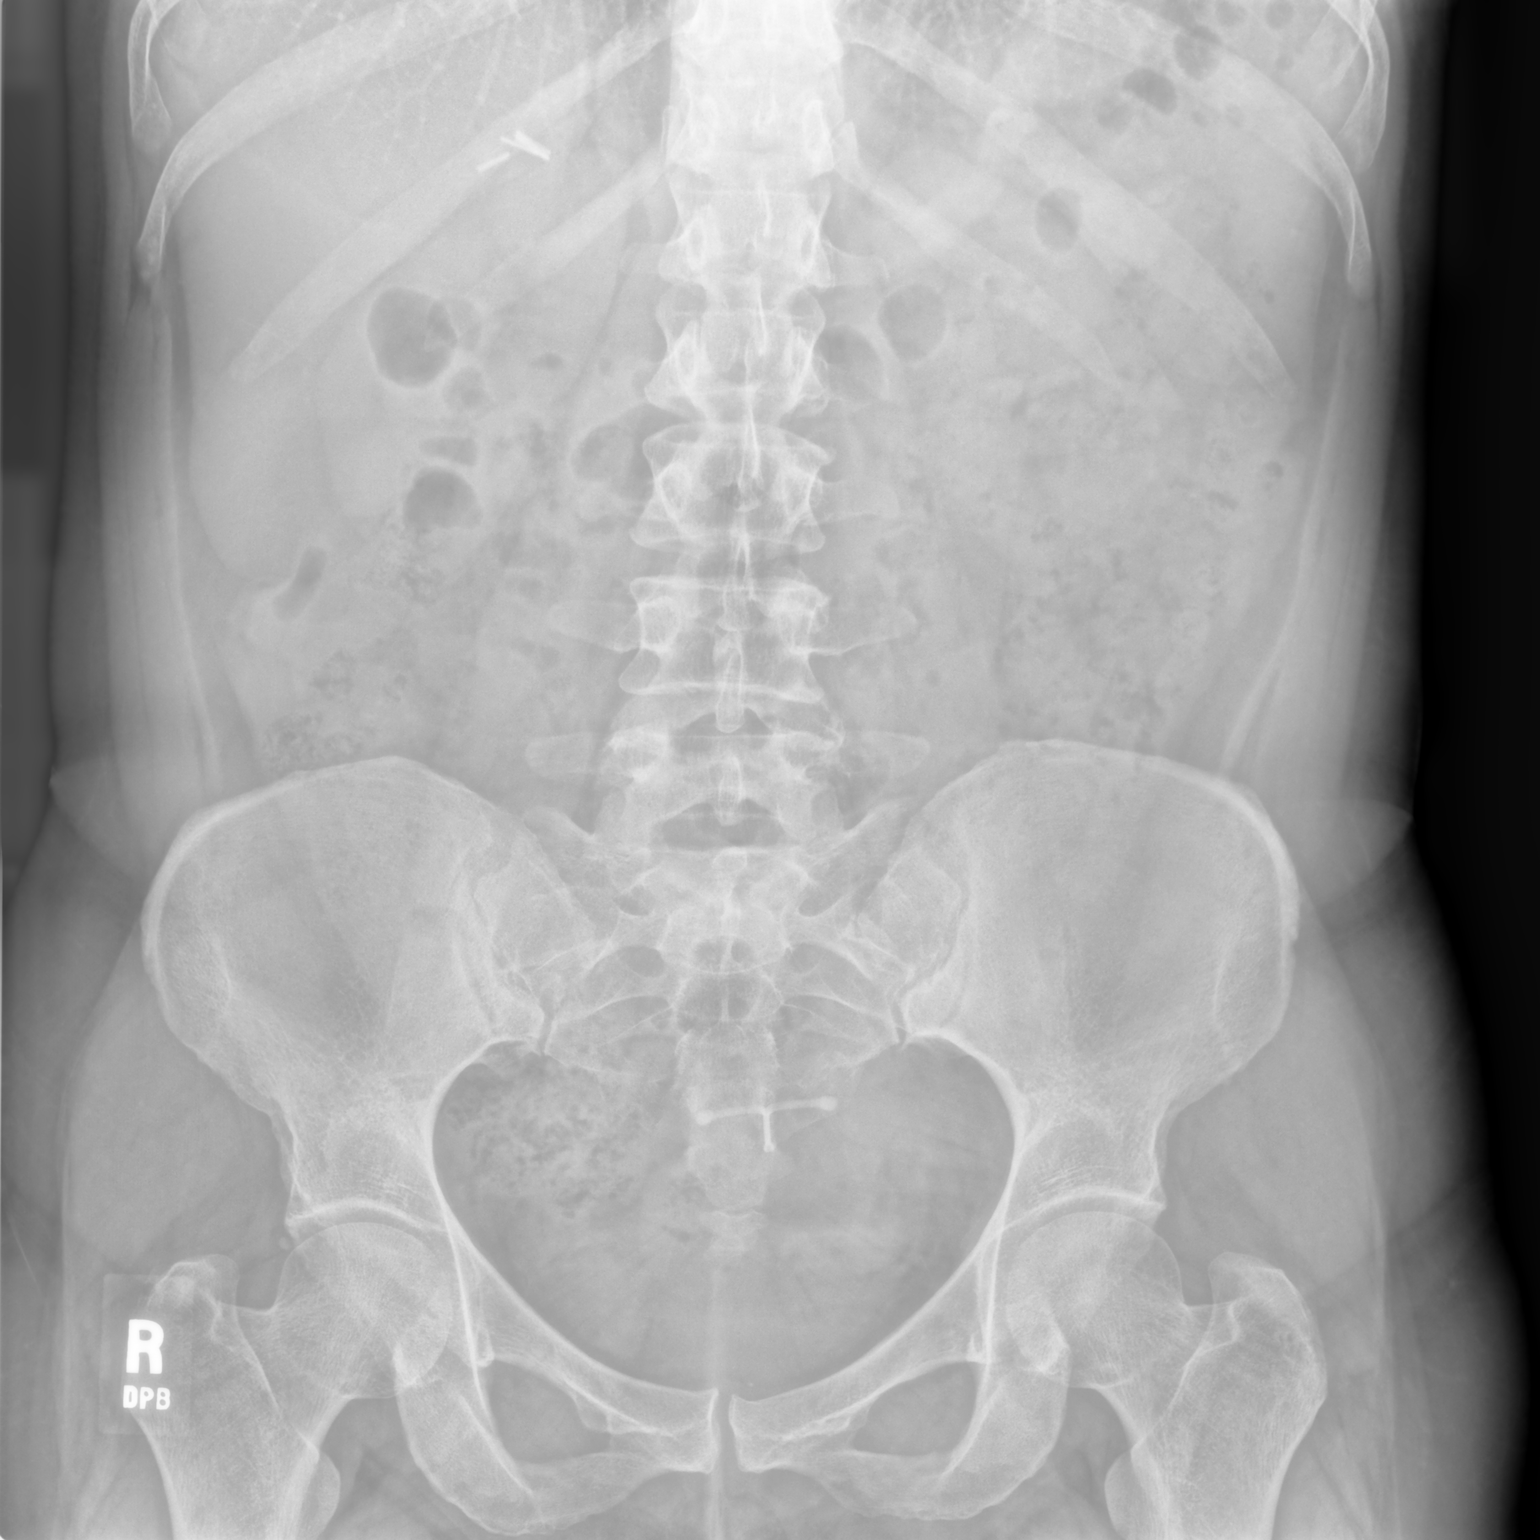

[1 of 1 positions shown; findings below may reference images not displayed]

FINDINGS: Bowel gas pattern is normal.

A small to moderate amount of stool within the colon is identified.
The cecum appears normal caliber.

There is no evidence of bowel obstruction.

IUD and cholecystectomy clips are present.
IMPRESSION: Normal bowel gas pattern.  Normal cecal caliber.

## 2023-01-18 NOTE — Assessment & Plan Note (Signed)
Lab Results  Component Value Date   CHOL 217 (H) 12/31/2022   HDL 48.00 12/31/2022   LDLCALC 156 (H) 12/31/2022   LDLDIRECT 155.7 09/11/2013   TRIG 65.0 12/31/2022   CHOLHDL 5 12/31/2022   Mother with history of hyperlipidemia.  Heart disease on her father side.  Patient does her best to try to eat well and exercise.  She would like to consult with lipid clinic prior to starting on any cholesterol medication.  She will continue to work on lifestyle changes.   The 10-year ASCVD risk score (Arnett DK, et al., 2019) is: 0.9%   Values used to calculate the score:     Age: 47 years     Sex: Female     Is Non-Hispanic African American: No     Diabetic: No     Tobacco smoker: No     Systolic Blood Pressure: 104 mmHg     Is BP treated: No     HDL Cholesterol: 48 mg/dL     Total Cholesterol: 217 mg/dL

## 2023-01-18 NOTE — Progress Notes (Signed)
Subjective:    Patient ID: Sierra Hodge, female    DOB: [birth year], 47 y.o.   MRN: [id number]  Chief Complaint  Patient presents with   Abdominal Pain    Pt c/o right side lower abdominal pain  in groin area; pt states she does get constipated and when she has bowel movement she feels better; and also discuss Statin medication per recent labs; pt suffering with allergies; pt been coughing since Friday, throat hurts; all in home with same symptoms, pt family tested negative Covid and Flu, and strep;     HPI Patient is in today for discussion about chronic RLQ abd pain and HLD.  2020 - first time she had the RLQ pain, so bad, that she couldn't even bend over to put shoes on, lasted about 4 days; she ended up seeing Dr. Leone Payor with GI, had a colonoscopy done; some inflammatory ulcerations were noted in the terminal ileum.  Biopsy showed possible differential for Crohn's disease, but her lab work was all negative.  She has had further evaluations done with her gynecologist, urogynecologist, and physical therapist.  Nothing has been found to account for her right lower quadrant pain.  She would like to revisit the possible idea that she might have Crohn's disease and would like a second opinion with GI.  She is requesting a referral today.  She would also like to discuss her recent cholesterol panel and options for treatment.  Past Medical History:  Diagnosis Date   Asthma    Chronic interstitial cystitis    Complication of anesthesia    hard to wake after anesthesia   Endometriosis    Fatty liver    Headache    Hyperlipidemia    IBS (irritable bowel syndrome)    Ileitis    IUD (intrauterine device) in place    Mirena, Dr. Pennie Rushing   NAFLD (nonalcoholic fatty liver disease)    Other psoriasis     Past Surgical History:  Procedure Laterality Date   CESAREAN SECTION     2008, 2010   CHOLECYSTECTOMY N/A 07/07/2017   Procedure: LAPAROSCOPIC CHOLECYSTECTOMY;  Surgeon: Abigail Miyamoto, MD;  Location: [facility];  Service: General;  Laterality: N/A;   CYSTO WITH HYDRODISTENSION  03/29/2012   Procedure: CYSTOSCOPY/HYDRODISTENSION;  Surgeon: Martina Sinner, MD;  Location: [facility];  Service: Urology;  Laterality: N/A;  with Instillation of Marcaine and Pyridium    ESOPHAGOGASTRODUODENOSCOPY  2018   LAPAROSCOPY  03/29/2012   Procedure: LAPAROSCOPY OPERATIVE;  Surgeon: Hal Morales, MD;  Location: [facility];  Service: Gynecology;  Laterality: N/A;  with Fulguration of Endometriosis and Peritoneal Biopsy   TONSILLECTOMY     TUBAL LIGATION  2010   UMBILICAL HERNIA REPAIR  10/2010    Family History  Problem Relation Age of Onset   Hyperlipidemia Mother    Allergic rhinitis Mother    Alzheimer's disease Father    Hyperlipidemia Sister    Diabetes Paternal Uncle    Heart disease Other        paternal family   Colon cancer Neg Hx    Breast cancer Neg Hx    Esophageal cancer Neg Hx    Rectal cancer Neg Hx    Stomach cancer Neg Hx     Social History   Tobacco Use   Smoking status: Never   Smokeless tobacco: Never  Vaping Use   Vaping Use: Never used  Substance Use Topics   Alcohol use: Yes    Comment:  rarely    Drug use: No     Allergies  Allergen Reactions   Morphine And Related Itching   Mobic [Meloxicam] Other (See Comments)    bloating    Review of Systems NEGATIVE UNLESS OTHERWISE INDICATED IN HPI      Objective:     BP 104/70 (BP Location: Left Arm)   Pulse (!) 104   Temp 98 F (36.7 C) (Temporal)   Ht 4\' 11"  (1.499 m)   Wt 141 lb 9.6 oz (64.2 kg)   SpO2 98%   BMI 28.60 kg/m   Wt Readings from Last 3 Encounters:  01/18/23 141 lb 9.6 oz (64.2 kg)  12/28/22 141 lb 3.2 oz (64 kg)  11/15/22 110 lb 2 oz (50 kg)    BP Readings from Last 3 Encounters:  01/18/23 104/70  12/28/22 104/70  11/15/22 111/70     Physical Exam Vitals and nursing note reviewed.  Constitutional:      Appearance: She is  well-developed.  Cardiovascular:     Rate and Rhythm: Normal rate and regular rhythm.     Heart sounds: No murmur heard. Pulmonary:     Effort: Pulmonary effort is normal.     Breath sounds: Normal breath sounds.  Abdominal:     General: Bowel sounds are normal.     Palpations: Abdomen is soft.     Tenderness: There is abdominal tenderness (minimal) in the right lower quadrant. There is no right CVA tenderness or left CVA tenderness. Negative signs include Murphy's sign and McBurney's sign.     Hernia: No hernia is present.  Skin:    General: Skin is warm.  Neurological:     General: No focal deficit present.     Mental Status: She is alert.  Psychiatric:        Mood and Affect: Mood normal.        Assessment & Plan:  Mixed hyperlipidemia Assessment & Plan: Lab Results  Component Value Date   CHOL 217 (H) 12/31/2022   HDL 48.00 12/31/2022   LDLCALC 156 (H) 12/31/2022   LDLDIRECT 155.7 09/11/2013   TRIG 65.0 12/31/2022   CHOLHDL 5 12/31/2022   Mother with history of hyperlipidemia.  Heart disease on her father side.  Patient does her best to try to eat well and exercise.  She would like to consult with lipid clinic prior to starting on any cholesterol medication.  She will continue to work on lifestyle changes.   The 10-year ASCVD risk score (Arnett DK, et al., 2019) is: 0.9%   Values used to calculate the score:     Age: 69 years     Sex: Female     Is Non-Hispanic African American: No     Diabetic: No     Tobacco smoker: No     Systolic Blood Pressure: 104 mmHg     Is BP treated: No     HDL Cholesterol: 48 mg/dL     Total Cholesterol: 217 mg/dL    Family history of elevated blood lipids -     [facility]B [facility]  Chronic RLQ pain Assessment & Plan: I personally reviewed her prior colonoscopy notes and biopsy.  It is possible she may have Crohn's or IBS or some other issue with inflammation in his right lower quadrant.  I agree with  another GI consult.  Referral placed for patient today.  Red flags discussed.  She knows when to present to the ER if things ever  change.  Orders: -     AMB Referral to [facility] -     Ambulatory referral to Gastroenterology     Crist Infante Ahlaya Ende, PA-C

## 2023-01-18 NOTE — Assessment & Plan Note (Signed)
I personally reviewed her prior colonoscopy notes and biopsy.  It is possible she may have Crohn's or IBS or some other issue with inflammation in his right lower quadrant.  I agree with another GI consult.  Referral placed for patient today.  Red flags discussed.  She knows when to present to the ER if things ever change.

## 2023-01-18 NOTE — Assessment & Plan Note (Deleted)
2020 - first time she had the RLQ pain, so bad, that she couldn't even bend over to put shoes on, lasted about 4 days; she ended up seeing Dr. Leone Payor with GI, had a colonoscopy done; some inflammatory ulcerations were noted in the terminal ileum.  Biopsy showed possible differential for Crohn's disease, but her lab work was all negative.  She has had further evaluations done with her gynecologist, urogynecologist, and physical therapist.  Nothing has been found to account for her right lower quadrant pain.  She would like to revisit the possible idea that she might have Crohn's disease and would like a second opinion with GI.  She is requesting a referral today.

## 2023-01-18 NOTE — Patient Instructions (Signed)
Good to see you today!  Referrals sent to lipid clinic and 2nd opinion with GI.  Please let us know if you do not hear from them about scheduling.  Work on cholesterol friendly and IBD friendly diets.

## 2023-02-03 ENCOUNTER — Ambulatory Visit: Payer: BC Managed Care – PPO | Admitting: Physician Assistant

## 2023-02-07 DIAGNOSIS — L821 Other seborrheic keratosis: Secondary | ICD-10-CM | POA: Diagnosis not present

## 2023-02-07 DIAGNOSIS — L409 Psoriasis, unspecified: Secondary | ICD-10-CM | POA: Diagnosis not present

## 2023-02-07 DIAGNOSIS — L814 Other melanin hyperpigmentation: Secondary | ICD-10-CM | POA: Diagnosis not present

## 2023-02-08 DIAGNOSIS — R140 Abdominal distension (gaseous): Secondary | ICD-10-CM | POA: Diagnosis not present

## 2023-02-08 DIAGNOSIS — R110 Nausea: Secondary | ICD-10-CM | POA: Diagnosis not present

## 2023-02-08 DIAGNOSIS — K5900 Constipation, unspecified: Secondary | ICD-10-CM | POA: Diagnosis not present

## 2023-02-08 DIAGNOSIS — R1031 Right lower quadrant pain: Secondary | ICD-10-CM | POA: Diagnosis not present

## 2023-03-22 ENCOUNTER — Ambulatory Visit (HOSPITAL_BASED_OUTPATIENT_CLINIC_OR_DEPARTMENT_OTHER): Payer: BC Managed Care – PPO | Admitting: Internal Medicine

## 2023-03-22 ENCOUNTER — Encounter (HOSPITAL_BASED_OUTPATIENT_CLINIC_OR_DEPARTMENT_OTHER): Payer: Self-pay | Admitting: Internal Medicine

## 2023-03-22 VITALS — BP 104/62 | HR 84 | Ht 59.0 in | Wt 139.9 lb

## 2023-03-22 DIAGNOSIS — Z7189 Other specified counseling: Secondary | ICD-10-CM

## 2023-03-22 DIAGNOSIS — E785 Hyperlipidemia, unspecified: Secondary | ICD-10-CM

## 2023-03-22 NOTE — Patient Instructions (Signed)
Medication Instructions:  NO CHANGES today  *If you need a refill on your cardiac medications before your next appointment, please call your pharmacy*   Lab Work: [facility] today   FASTING lab work before your next visit with Dr. Rennis Golden   If you have labs (blood work) drawn today and your tests are completely normal, you will receive your results only by: MyChart Message (if you have MyChart) OR A paper copy in the mail If you have any lab test that is abnormal or we need to change your treatment, we will call you to review the results.   Testing/Procedures: Dr. Rennis Golden has ordered a CT coronary calcium score.   Test locations:  [facility]  [facility]  This is $99 out of pocket.   Coronary CalciumScan A coronary calcium scan is an imaging test used to look for deposits of calcium and other fatty materials (plaques) in the inner lining of the blood vessels of the heart (coronary arteries). These deposits of calcium and plaques can partly clog and narrow the coronary arteries without producing any symptoms or warning signs. This puts a person at risk for a heart attack. This test can detect these deposits before symptoms develop. Tell a health care provider about: Any allergies you have. All medicines you are taking, including vitamins, herbs, eye drops, creams, and over-the-counter medicines. Any problems you or family members have had with anesthetic medicines. Any blood disorders you have. Any surgeries you have had. Any medical conditions you have. Whether you are pregnant or may be pregnant. What are the risks? Generally, this is a safe procedure. However, problems may occur, including: Harm to a pregnant woman and her unborn baby. This test involves the use of radiation. Radiation exposure can be dangerous to a pregnant woman and her unborn baby. If you are pregnant, you generally should  not have this procedure done. Slight increase in the risk of cancer. This is because of the radiation involved in the test. What happens before the procedure? No preparation is needed for this procedure. What happens during the procedure? You will undress and remove any jewelry around your neck or chest. You will put on a hospital gown. Sticky electrodes will be placed on your chest. The electrodes will be connected to an electrocardiogram (ECG) machine to record a tracing of the electrical activity of your heart. A CT scanner will take pictures of your heart. During this time, you will be asked to lie still and hold your breath for 2-3 seconds while a picture of your heart is being taken. The procedure may vary among health care providers and hospitals. What happens after the procedure? You can get dressed. You can return to your normal activities. It is up to you to get the results of your test. Ask your health care provider, or the department that is doing the test, when your results will be ready. Summary A coronary calcium scan is an imaging test used to look for deposits of calcium and other fatty materials (plaques) in the inner lining of the blood vessels of the heart (coronary arteries). Generally, this is a safe procedure. Tell your health care provider if you are pregnant or may be pregnant. No preparation is needed for this procedure. A CT scanner will take pictures of your heart. You can return to your normal activities after the scan is done. This information is not intended to replace advice given to  you by your health care provider. Make sure you discuss any questions you have with your health care provider. Document Released: 03/04/2008 Document Revised: 07/26/2016 Document Reviewed: 07/26/2016 Elsevier Interactive Patient Education  2017 [employer].    Follow-Up: At [facility], you and your health needs are our priority.  As part of our continuing mission to  provide you with exceptional heart care, we have created designated Provider Care Teams.  These Care Teams include your primary Cardiologist (physician) and Advanced Practice Providers (APPs -  Physician Assistants and Nurse Practitioners) who all work together to provide you with the care you need, when you need it.  We recommend signing up for the patient portal called "MyChart".  Sign up information is provided on this After Visit Summary.  MyChart is used to connect with patients for Virtual Visits (Telemedicine).  Patients are able to view lab/test results, encounter notes, upcoming appointments, etc.  Non-urgent messages can be sent to your provider as well.   To learn more about what you can do with MyChart, go to [email or web address].    Your next appointment:    4 months with Dr. Rennis Golden   Other Instructions  [facility] - genetic test CPT codes for the Dyslipidemia and ASCVD panel are as follows: 81401, 81405, 81406.

## 2023-03-22 NOTE — Progress Notes (Signed)
LIPID CLINIC CONSULT NOTE  Chief Complaint:  Manage dyslipidemia  Primary Care Physician: Allwardt, Crist Infante, PA-C  Primary Cardiologist:  None  HPI:  Sierra Hodge is a 47 y.o. female who is being seen today for the evaluation of dyslipidemia at the request of Allwardt, Alyssa M, PA-C. 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  She was referred by her primary care provider for management of dyslipidemia.  Recent labs showed total cholesterol 270, triglycerides 65, HDL 48 and LDL 156.  She has been somewhat hesitant to go on lipid-lowering therapies and wanted to get further evaluation to see if possibly there was a genetic reason for her higher cholesterol.  She has made dietary changes and exercise to try to lower cholesterol but has not had much success.  She does note some remote heart disease in her family particularly her father side in her uncles but not necessarily in her father.  Both parents I believe had high cholesterol.  She had a 10-year risk calculation of 0.9% and lifetime risk calculation of 36%.  PMHx:  Past Medical History:  Diagnosis Date   Asthma    Chronic interstitial cystitis    Complication of anesthesia    hard to wake after anesthesia   Endometriosis    Fatty liver    Headache    Hyperlipidemia    IBS (irritable bowel syndrome)    Ileitis    IUD (intrauterine device) in place    Mirena, Dr. Pennie Rushing   NAFLD (nonalcoholic fatty liver disease)    Other psoriasis     Past Surgical History:  Procedure Laterality Date   CESAREAN SECTION     2008, 2010   CHOLECYSTECTOMY N/A 07/07/2017   Procedure: LAPAROSCOPIC CHOLECYSTECTOMY;  Surgeon: Abigail Miyamoto, MD;  Location: [facility];  Service: General;  Laterality: N/A;   CYSTO WITH HYDRODISTENSION  03/29/2012   Procedure: CYSTOSCOPY/HYDRODISTENSION;  Surgeon: Martina Sinner, MD;  Location: [facility];  Service: Urology;   Laterality: N/A;  with Instillation of Marcaine and Pyridium    ESOPHAGOGASTRODUODENOSCOPY  2018   LAPAROSCOPY  03/29/2012   Procedure: LAPAROSCOPY OPERATIVE;  Surgeon: Hal Morales, MD;  Location: [facility];  Service: Gynecology;  Laterality: N/A;  with Fulguration of Endometriosis and Peritoneal Biopsy   TONSILLECTOMY     TUBAL LIGATION  2010   UMBILICAL HERNIA REPAIR  10/2010    FAMHx:  Family History  Problem Relation Age of Onset   Hyperlipidemia Mother    Allergic rhinitis Mother    Alzheimer's disease Father    Hyperlipidemia Sister    Diabetes Paternal Uncle    Heart disease Other        paternal family   Colon cancer Neg Hx    Breast cancer Neg Hx    Esophageal cancer Neg Hx    Rectal cancer Neg Hx    Stomach cancer Neg Hx     SOCHx:   reports that she has never smoked. She has never used smokeless tobacco. She reports current alcohol use. She reports that she does not use drugs.  ALLERGIES:  Allergies  Allergen Reactions   Morphine And Codeine Itching   Mobic [Meloxicam] Other (See Comments)    bloating    ROS: Pertinent items noted in HPI and remainder of comprehensive ROS otherwise negative.  HOME MEDS: Current Outpatient Medications on File Prior to Visit  Medication Sig Dispense Refill  albuterol (VENTOLIN HFA) 108 (90 Base) MCG/ACT inhaler Inhale 2 puffs into the lungs every 6 (six) hours as needed for wheezing or shortness of breath. 18 g 1   amitriptyline (ELAVIL) 10 MG tablet Take 10 mg by mouth at bedtime.     baclofen (LIORESAL) 10 MG tablet Take 10 mg by mouth 3 (three) times daily. Taking PRN     Calcium Glycerophosphate (PRELIEF PO) Take by mouth. Before meals     chloridazePOXIDE-amitriptyline (LIMBITROL) 5-12.5 MG tablet Take 1 tablet by mouth 2 (two) times daily.     clobetasol ointment (TEMOVATE) 0.05 % Apply topically daily as needed. 60 g 5   famotidine (PEPCID) 20 MG tablet Take 20 mg by mouth daily. Only taking in morning      hydrOXYzine (ATARAX) 10 MG tablet Take 1 tablet by mouth at bedtime.     levonorgestrel (MIRENA) 20 MCG/24HR IUD 1 each by Intrauterine route once.     montelukast (SINGULAIR) 10 MG tablet Take 1 tablet by mouth daily.     triamcinolone ointment (KENALOG) 0.1 % Use 1 application sparingly twice a day as needed to red itchy areas. Do not use on face, neck, groin, or armpit region 15 g 0   Current Facility-Administered Medications on File Prior to Visit  Medication Dose Route Frequency Provider Last Rate Last Admin   gadopentetate dimeglumine (MAGNEVIST) injection 13 mL  13 mL Intravenous Once PRN Anson Fret, MD        LABS/IMAGING: No results found for this or any previous visit (from the past 48 hour(s)). No results found.  LIPID PANEL:    Component Value Date/Time   CHOL 217 (H) 12/31/2022 0839   TRIG 65.0 12/31/2022 0839   HDL 48.00 12/31/2022 0839   CHOLHDL 5 12/31/2022 0839   VLDL 13.0 12/31/2022 0839   LDLCALC 156 (H) 12/31/2022 0839   LDLCALC 136 (H) 07/11/2020 0849   LDLDIRECT 155.7 09/11/2013 1101    WEIGHTS: Wt Readings from Last 3 Encounters:  03/22/23 139 lb 14.4 oz (63.5 kg)  01/18/23 141 lb 9.6 oz (64.2 kg)  12/28/22 141 lb 3.2 oz (64 kg)    VITALS: BP 104/62 (BP Location: Left Arm, Patient Position: Sitting, Cuff Size: Normal)   Pulse 84   Ht 4\' 11"  (1.499 m)   Wt 139 lb 14.4 oz (63.5 kg)   SpO2 97%   BMI 28.26 kg/m   EXAM: Deferred  EKG: Deferred  ASSESSMENT: Mixed dyslipidemia, goal LDL less than 100 Remote family history of heart disease  PLAN: 1.   [name] has a mixed dyslipidemia with an elevated LDL cholesterol that has not improved with diet and exercise.  There likely is a genetic component to this.  Her risk is considered low at this point however she may benefit from therapy.  We could further stratify her with a calcium score and LP(a).  She is agreeable to that.  We also discussed possibility of genetic testing.  While I do not  believe this is a familial hyperlipidemia per se, there may be genetic variants that risk that could explain the elevated cholesterol.  To that end, it may not be covered by insurance regarding genetic testing however out-of-pocket cost could be up to $299.  She will investigate this with her insurance and may wish to proceed with testing.  Once we have further information we could consider therapeutic options that she has not previously been on any medical therapy for lipid lowering.  Thanks again for  the kind referral.  Chrystie Nose, MD, Milagros Loll  [place]  [facility]  Medical Director of the Advanced Lipid Disorders &  Cardiovascular Risk Reduction Clinic Diplomate of the American Board of Clinical Lipidology Attending Cardiologist  Direct Dial: [phone]  Fax: [phone]  Website:  [email or web address]  Lisette Abu Zakiah Beckerman 03/22/2023, 1:15 PM

## 2023-03-23 DIAGNOSIS — K5900 Constipation, unspecified: Secondary | ICD-10-CM | POA: Diagnosis not present

## 2023-03-23 DIAGNOSIS — R1011 Right upper quadrant pain: Secondary | ICD-10-CM | POA: Diagnosis not present

## 2023-03-23 DIAGNOSIS — R109 Unspecified abdominal pain: Secondary | ICD-10-CM | POA: Diagnosis not present

## 2023-03-23 DIAGNOSIS — R1031 Right lower quadrant pain: Secondary | ICD-10-CM | POA: Diagnosis not present

## 2023-03-23 DIAGNOSIS — Z975 Presence of (intrauterine) contraceptive device: Secondary | ICD-10-CM | POA: Diagnosis not present

## 2023-03-23 LAB — LIPOPROTEIN A (LPA): Lipoprotein (a): <8.4 nmol/L (ref <75.0)

## 2023-03-28 ENCOUNTER — Ambulatory Visit: Payer: BC Managed Care – PPO | Admitting: Obstetrics and Gynecology

## 2023-03-28 DIAGNOSIS — D239 Other benign neoplasm of skin, unspecified: Secondary | ICD-10-CM | POA: Diagnosis not present

## 2023-03-28 DIAGNOSIS — L409 Psoriasis, unspecified: Secondary | ICD-10-CM | POA: Diagnosis not present

## 2023-03-28 DIAGNOSIS — L905 Scar conditions and fibrosis of skin: Secondary | ICD-10-CM | POA: Diagnosis not present

## 2023-03-28 DIAGNOSIS — L309 Dermatitis, unspecified: Secondary | ICD-10-CM | POA: Diagnosis not present

## 2023-05-09 ENCOUNTER — Ambulatory Visit (HOSPITAL_BASED_OUTPATIENT_CLINIC_OR_DEPARTMENT_OTHER)
Admission: RE | Admit: 2023-05-09 | Discharge: 2023-05-09 | Disposition: A | Discharge status: Home / Self Care | Payer: BC Managed Care – PPO | Source: Ambulatory Visit | Attending: Internal Medicine | Admitting: Internal Medicine

## 2023-05-09 DIAGNOSIS — M6289 Other specified disorders of muscle: Secondary | ICD-10-CM | POA: Diagnosis not present

## 2023-05-09 DIAGNOSIS — E785 Hyperlipidemia, unspecified: Secondary | ICD-10-CM | POA: Insufficient documentation

## 2023-05-09 DIAGNOSIS — Z136 Encounter for screening for cardiovascular disorders: Secondary | ICD-10-CM | POA: Diagnosis not present

## 2023-05-09 DIAGNOSIS — R339 Retention of urine, unspecified: Secondary | ICD-10-CM | POA: Insufficient documentation

## 2023-05-09 DIAGNOSIS — N3010 Interstitial cystitis (chronic) without hematuria: Secondary | ICD-10-CM | POA: Diagnosis not present

## 2023-05-25 ENCOUNTER — Other Ambulatory Visit: Payer: Self-pay | Admitting: Internal Medicine

## 2023-05-25 DIAGNOSIS — R911 Solitary pulmonary nodule: Secondary | ICD-10-CM

## 2023-06-02 DIAGNOSIS — K5900 Constipation, unspecified: Secondary | ICD-10-CM | POA: Diagnosis not present

## 2023-06-02 DIAGNOSIS — K581 Irritable bowel syndrome with constipation: Secondary | ICD-10-CM | POA: Diagnosis not present

## 2023-06-02 DIAGNOSIS — R140 Abdominal distension (gaseous): Secondary | ICD-10-CM | POA: Diagnosis not present

## 2023-06-02 DIAGNOSIS — R1030 Lower abdominal pain, unspecified: Secondary | ICD-10-CM | POA: Diagnosis not present

## 2023-06-15 DIAGNOSIS — K5900 Constipation, unspecified: Secondary | ICD-10-CM | POA: Diagnosis not present

## 2023-06-15 DIAGNOSIS — K5790 Diverticulosis of intestine, part unspecified, without perforation or abscess without bleeding: Secondary | ICD-10-CM | POA: Diagnosis not present

## 2023-06-15 DIAGNOSIS — K7689 Other specified diseases of liver: Secondary | ICD-10-CM | POA: Diagnosis not present

## 2023-07-13 ENCOUNTER — Telehealth: Payer: Self-pay | Admitting: Internal Medicine

## 2023-07-13 ENCOUNTER — Other Ambulatory Visit (HOSPITAL_BASED_OUTPATIENT_CLINIC_OR_DEPARTMENT_OTHER): Payer: Self-pay

## 2023-07-13 DIAGNOSIS — E785 Hyperlipidemia, unspecified: Secondary | ICD-10-CM

## 2023-07-13 MED ORDER — FLULAVAL 0.5 ML IM SUSY
0.5000 mL | PREFILLED_SYRINGE | Freq: Once | INTRAMUSCULAR | 0 refills | Status: AC
  Filled 2023-07-13: qty 0.5, 1d supply, fill #0

## 2023-07-13 NOTE — Telephone Encounter (Signed)
Left message to call back Unsure what she is referring to  MyChart message also sent

## 2023-07-13 NOTE — Telephone Encounter (Signed)
Pt's ins calling saying they need a lab correction order. Specify diagnosis code. Please call pt.

## 2023-07-13 NOTE — Telephone Encounter (Signed)
Will forward to Herb Grays RN with Dr Rennis Golden, unsure if she had the genetic testing

## 2023-07-21 DIAGNOSIS — R1031 Right lower quadrant pain: Secondary | ICD-10-CM | POA: Diagnosis not present

## 2023-07-21 DIAGNOSIS — E785 Hyperlipidemia, unspecified: Secondary | ICD-10-CM | POA: Diagnosis not present

## 2023-07-21 DIAGNOSIS — K5900 Constipation, unspecified: Secondary | ICD-10-CM | POA: Diagnosis not present

## 2023-07-21 DIAGNOSIS — K581 Irritable bowel syndrome with constipation: Secondary | ICD-10-CM | POA: Diagnosis not present

## 2023-07-22 ENCOUNTER — Encounter: Payer: BC Managed Care – PPO | Admitting: Physician Assistant

## 2023-07-22 LAB — LIPID PANEL
Chol/HDL Ratio: 4.3 ratio (ref 0.0–4.4)
Cholesterol, Total: 213 mg/dL — ABNORMAL HIGH (ref 100–199)
HDL: 50 mg/dL (ref >39)
LDL Chol Calc (NIH): 147 mg/dL — ABNORMAL HIGH (ref 0–99)
Triglycerides: 90 mg/dL (ref 0–149)
VLDL Cholesterol Cal: 16 mg/dL (ref 5–40)

## 2023-07-26 ENCOUNTER — Encounter (HOSPITAL_BASED_OUTPATIENT_CLINIC_OR_DEPARTMENT_OTHER): Payer: Self-pay | Admitting: Internal Medicine

## 2023-07-26 ENCOUNTER — Ambulatory Visit (HOSPITAL_BASED_OUTPATIENT_CLINIC_OR_DEPARTMENT_OTHER): Payer: BC Managed Care – PPO | Admitting: Internal Medicine

## 2023-07-26 VITALS — BP 120/80 | HR 89 | Ht 59.0 in | Wt 148.0 lb

## 2023-07-26 DIAGNOSIS — E785 Hyperlipidemia, unspecified: Secondary | ICD-10-CM | POA: Diagnosis not present

## 2023-07-26 DIAGNOSIS — R911 Solitary pulmonary nodule: Secondary | ICD-10-CM | POA: Diagnosis not present

## 2023-07-26 NOTE — Patient Instructions (Signed)
Medication Instructions:  NO CHANGES  *If you need a refill on your cardiac medications before your next appointment, please call your pharmacy*   Lab Work: FASTING lab work to check cholesterol in 6 months  If you have labs (blood work) drawn today and your tests are completely normal, you will receive your results only by: MyChart Message (if you have MyChart) OR A paper copy in the mail If you have any lab test that is abnormal or we need to change your treatment, we will call you to review the results.   Follow-Up: At [facility], you and your health needs are our priority.  As part of our continuing mission to provide you with exceptional heart care, we have created designated Provider Care Teams.  These Care Teams include your primary Cardiologist (physician) and Advanced Practice Providers (APPs -  Physician Assistants and Nurse Practitioners) who all work together to provide you with the care you need, when you need it.  We recommend signing up for the patient portal called "MyChart".  Sign up information is provided on this After Visit Summary.  MyChart is used to connect with patients for Virtual Visits (Telemedicine).  Patients are able to view lab/test results, encounter notes, upcoming appointments, etc.  Non-urgent messages can be sent to your provider as well.   To learn more about what you can do with MyChart, go to [email or web address].    Your next appointment:    6 months with Dr. Hilty 

## 2023-07-26 NOTE — Progress Notes (Signed)
LIPID CLINIC CONSULT NOTE  Chief Complaint:  Follow-up dyslipidemia  Primary Care Physician: Allwardt, Crist Infante, PA-C  Primary Cardiologist:  None  HPI:  Sierra Hodge is a 47 y.o. female who is being seen today for the evaluation of dyslipidemia at the request of Allwardt, Alyssa M, PA-C. 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  She was referred by her primary care provider for management of dyslipidemia.  Recent labs showed total cholesterol 270, triglycerides 65, HDL 48 and LDL 156.  She has been somewhat hesitant to go on lipid-lowering therapies and wanted to get further evaluation to see if possibly there was a genetic reason for her higher cholesterol.  She has made dietary changes and exercise to try to lower cholesterol but has not had much success.  She does note some remote heart disease in her family particularly her father side in her uncles but not necessarily in her father.  Both parents I believe had high cholesterol.  She had a 10-year risk calculation of 0.9% and lifetime risk calculation of 36%.  07/26/2023  Sierra Hodge returns today for follow-up.  Her workup is largely been negative.  She had a coronary calcium score which was 0 unfortunately she had a very small lung nodule.  This is likely benign however will order repeat CT of the chest in a year to follow-up on it.  Cholesterol continues to be elevated.  Total 213, triglycerides 90, HDL 50 and LDL 147.  We talked about ways to manage this including diet and lifestyle modifications.  She did have an LP(a) performed which was negative.  PMHx:  Past Medical History:  Diagnosis Date   Asthma    Chronic interstitial cystitis    Complication of anesthesia    hard to wake after anesthesia   Endometriosis    Fatty liver    Headache    Hyperlipidemia    IBS (irritable bowel syndrome)    Ileitis    IUD (intrauterine device) in place    Mirena,  Dr. Pennie Rushing   NAFLD (nonalcoholic fatty liver disease)    Other psoriasis     Past Surgical History:  Procedure Laterality Date   CESAREAN SECTION     2008, 2010   CHOLECYSTECTOMY N/A 07/07/2017   Procedure: LAPAROSCOPIC CHOLECYSTECTOMY;  Surgeon: Abigail Miyamoto, MD;  Location: [facility];  Service: General;  Laterality: N/A;   CYSTO WITH HYDRODISTENSION  03/29/2012   Procedure: CYSTOSCOPY/HYDRODISTENSION;  Surgeon: Martina Sinner, MD;  Location: [facility];  Service: Urology;  Laterality: N/A;  with Instillation of Marcaine and Pyridium    ESOPHAGOGASTRODUODENOSCOPY  2018   LAPAROSCOPY  03/29/2012   Procedure: LAPAROSCOPY OPERATIVE;  Surgeon: Hal Morales, MD;  Location: [facility];  Service: Gynecology;  Laterality: N/A;  with Fulguration of Endometriosis and Peritoneal Biopsy   TONSILLECTOMY     TUBAL LIGATION  2010   UMBILICAL HERNIA REPAIR  10/2010    FAMHx:  Family History  Problem Relation Age of Onset   Hyperlipidemia Mother    Allergic rhinitis Mother    Alzheimer's disease Father    Hyperlipidemia Sister    Diabetes Paternal Uncle    Heart disease Other        paternal family   Colon cancer Neg Hx    Breast cancer Neg Hx    Esophageal cancer Neg Hx    Rectal cancer Neg Hx    Stomach  cancer Neg Hx     SOCHx:   reports that she has never smoked. She has never used smokeless tobacco. She reports current alcohol use. She reports that she does not use drugs.  ALLERGIES:  Allergies  Allergen Reactions   Morphine And Codeine Itching   Mobic [Meloxicam] Other (See Comments)    bloating    ROS: Pertinent items noted in HPI and remainder of comprehensive ROS otherwise negative.  HOME MEDS: Current Outpatient Medications on File Prior to Visit  Medication Sig Dispense Refill   albuterol (VENTOLIN HFA) 108 (90 Base) MCG/ACT inhaler Inhale 2 puffs into the lungs every 6 (six) hours as needed for wheezing or shortness of breath. 18 g 1    amitriptyline (ELAVIL) 10 MG tablet Take 10 mg by mouth at bedtime.     baclofen (LIORESAL) 10 MG tablet Take 10 mg by mouth 3 (three) times daily. Taking PRN     Calcium Glycerophosphate (PRELIEF PO) Take by mouth. Before meals     chloridazePOXIDE-amitriptyline (LIMBITROL) 5-12.5 MG tablet Take 1 tablet by mouth 2 (two) times daily.     clobetasol ointment (TEMOVATE) 0.05 % Apply topically daily as needed. 60 g 5   famotidine (PEPCID) 20 MG tablet Take 20 mg by mouth daily. Only taking in morning     hydrOXYzine (ATARAX) 10 MG tablet Take 1 tablet by mouth at bedtime.     levonorgestrel (MIRENA) 20 MCG/24HR IUD 1 each by Intrauterine route once.     montelukast (SINGULAIR) 10 MG tablet Take 1 tablet by mouth daily.     triamcinolone ointment (KENALOG) 0.1 % Use 1 application sparingly twice a day as needed to red itchy areas. Do not use on face, neck, groin, or armpit region 15 g 0   Current Facility-Administered Medications on File Prior to Visit  Medication Dose Route Frequency Provider Last Rate Last Admin   gadopentetate dimeglumine (MAGNEVIST) injection 13 mL  13 mL Intravenous Once PRN Anson Fret, MD        LABS/IMAGING: No results found for this or any previous visit (from the past 48 hour(s)). No results found.  LIPID PANEL:    Component Value Date/Time   CHOL 213 (H) 07/21/2023 0833   TRIG 90 07/21/2023 0833   HDL 50 07/21/2023 0833   CHOLHDL 4.3 07/21/2023 0833   CHOLHDL 5 12/31/2022 0839   VLDL 13.0 12/31/2022 0839   LDLCALC 147 (H) 07/21/2023 0833   LDLCALC 136 (H) 07/11/2020 0849   LDLDIRECT 155.7 09/11/2013 1101    WEIGHTS: Wt Readings from Last 3 Encounters:  07/26/23 148 lb (67.1 kg)  03/22/23 139 lb 14.4 oz (63.5 kg)  01/18/23 141 lb 9.6 oz (64.2 kg)    VITALS: BP 120/80 (BP Location: Left Arm, Patient Position: Sitting, Cuff Size: Normal)   Pulse 89   Ht 4\' 11"  (1.499 m)   Wt 148 lb (67.1 kg)   BMI 29.89 kg/m    EXAM: Deferred  EKG: Deferred  ASSESSMENT: Mixed dyslipidemia, goal LDL less than 100 Remote family history of heart disease Negative LP(a) 0 CAC score (04/2023)  PLAN: 1.   Sierra Hodge had no coronary calcium and a remote family history of heart disease.  I would target her LDL to less than 100.  She thinks that she can do more with diet and lifestyle.  Weight loss may be helpful.  Her weight is up about 10 pounds since the summer.  I would advise lifestyle modifications with a plan to  repeat lipids in about 6 months.  We may need to start moderate dose statin.  Chrystie Nose, MD, [facility], FACP  [place]  [facility]  Medical Director of the Advanced Lipid Disorders &  Cardiovascular Risk Reduction Clinic Diplomate of the American Board of Clinical Lipidology Attending Cardiologist  Direct Dial: [phone]  Fax: [phone]  Website:  [email or web address] Nicely Caron Ode 07/26/2023, 2:46 PM

## 2023-08-16 DIAGNOSIS — M6281 Muscle weakness (generalized): Secondary | ICD-10-CM | POA: Diagnosis not present

## 2023-08-16 DIAGNOSIS — M62838 Other muscle spasm: Secondary | ICD-10-CM | POA: Diagnosis not present

## 2023-08-16 DIAGNOSIS — M6289 Other specified disorders of muscle: Secondary | ICD-10-CM | POA: Diagnosis not present

## 2023-08-16 DIAGNOSIS — R1031 Right lower quadrant pain: Secondary | ICD-10-CM | POA: Diagnosis not present

## 2023-08-31 ENCOUNTER — Ambulatory Visit (INDEPENDENT_AMBULATORY_CARE_PROVIDER_SITE_OTHER): Payer: BC Managed Care – PPO | Admitting: Physician Assistant

## 2023-08-31 ENCOUNTER — Encounter: Payer: Self-pay | Admitting: Physician Assistant

## 2023-08-31 VITALS — BP 104/70 | HR 103 | Temp 97.7°F | Ht 59.0 in | Wt 142.0 lb

## 2023-08-31 DIAGNOSIS — N946 Dysmenorrhea, unspecified: Secondary | ICD-10-CM | POA: Insufficient documentation

## 2023-08-31 DIAGNOSIS — N951 Menopausal and female climacteric states: Secondary | ICD-10-CM

## 2023-08-31 DIAGNOSIS — N809 Endometriosis, unspecified: Secondary | ICD-10-CM

## 2023-08-31 DIAGNOSIS — R319 Hematuria, unspecified: Secondary | ICD-10-CM

## 2023-08-31 DIAGNOSIS — Z0000 Encounter for general adult medical examination without abnormal findings: Secondary | ICD-10-CM | POA: Diagnosis not present

## 2023-08-31 DIAGNOSIS — G8929 Other chronic pain: Secondary | ICD-10-CM | POA: Diagnosis not present

## 2023-08-31 DIAGNOSIS — R1031 Right lower quadrant pain: Secondary | ICD-10-CM

## 2023-08-31 DIAGNOSIS — N926 Irregular menstruation, unspecified: Secondary | ICD-10-CM | POA: Insufficient documentation

## 2023-08-31 DIAGNOSIS — N803 Endometriosis of pelvic peritoneum, unspecified: Secondary | ICD-10-CM | POA: Insufficient documentation

## 2023-08-31 DIAGNOSIS — K5900 Constipation, unspecified: Secondary | ICD-10-CM

## 2023-08-31 DIAGNOSIS — Z131 Encounter for screening for diabetes mellitus: Secondary | ICD-10-CM | POA: Diagnosis not present

## 2023-08-31 DIAGNOSIS — E663 Overweight: Secondary | ICD-10-CM | POA: Insufficient documentation

## 2023-08-31 HISTORY — DX: Hematuria, unspecified: R31.9

## 2023-08-31 HISTORY — DX: Constipation, unspecified: K59.00

## 2023-08-31 NOTE — Progress Notes (Unsigned)
Patient ID: Karynna Hawkins, female    DOB: 1976/02/15, 47 y.o.   MRN: [id number]   Assessment & Plan:  Encounter for annual physical exam -     CBC with Differential/Platelet; Future -     Comprehensive metabolic panel; Future -     Hemoglobin A1c; Future -     TSH; Future -     Vitamin B12; Future -     VITAMIN D 25 Hydroxy (Vit-D Deficiency, Fractures); Future  Chronic RLQ pain  Endometriosis  Perimenopausal    Assessment and Plan    Gastrointestinal Pain Chronic right-sided abdominal pain despite bowel movements twice daily with Linzess 145mg . Recent CT scan unremarkable. Patient reports frustration with current management and concern about potential impact of menopause on symptoms. -Consider consultation with urogynecologist or neurologist for potential alternatives to Amitriptyline, which may be contributing to constipation. -Encourage patient to discuss with gastroenterologist the potential impact of menopause on her symptoms and whether symptom improvement can be expected post-menopause.  Endometriosis Managed with Mirena IUD, due for removal in April. Patient reports no current pain, but is concerned about potential increase in symptoms post-removal. -Consider consultation with gynecologist to discuss options for management post-removal of Mirena IUD.  Perimenopause Patient reports minimal hot flashes, managed with Amitriptyline. Vaginal dryness managed with non-hormonal lubricant. Patient inquires about potential treatment for menopause. -Encourage patient to discuss with gynecologist potential need for hormone replacement therapy and its potential impact on gastrointestinal symptoms.  Alcohol Intolerance Patient reports feeling nauseous and unwell after consuming wine. -Order liver panel to assess liver function.  General Health Maintenance -Encourage patient to schedule mammogram at preferred location.      Age-appropriate screening and counseling performed  today. Will check labs and call with results. Preventive measures discussed and printed in AVS for patient.   Patient Counseling: [x]   Nutrition: Stressed importance of moderation in sodium/caffeine intake, saturated fat and cholesterol, caloric balance, sufficient intake of fresh fruits, vegetables, and fiber.  [x]   Stressed the importance of regular exercise.   []   Substance Abuse: Discussed cessation/primary prevention of tobacco, alcohol, or other drug use; driving or other dangerous activities under the influence; availability of treatment for abuse.   []   Injury prevention: Discussed safety belts, safety helmets, smoke detector, smoking near bedding or upholstery.   []   Sexuality: Discussed sexually transmitted diseases, partner selection, use of condoms, avoidance of unintended pregnancy  and contraceptive alternatives.   [x]   Dental health: Discussed importance of regular tooth brushing, flossing, and dental visits.  [x]   Health maintenance and immunizations reviewed. Please refer to Health maintenance section.        Return in about 6 months (around 02/29/2024) for recheck/follow-up.    Subjective:    Chief Complaint  Patient presents with   Annual Exam    Pt in office for annual CPE and fasting labs but pt not fasting, not needing Lipid since Cardiologist completed in November; pt wanting to add liver panel to labs; pt has no concerns to discuss otherwise; pt has appt with GYN in April to complete Pap. Pt wanting to start doing Mammogram at [facility],     HPI Patient is in today for annual exam and a few other concerns.  Discussed the use of AI scribe software for clinical note transcription with the patient, who gave verbal consent to proceed.  History of Present Illness   The patient, with a history of endometriosis and irritable bowel syndrome, presents with ongoing right-sided abdominal pain and  blockage despite being on Linzess 145. The patient reports going to the bathroom  twice a day, describing the bowel movements as diarrhea-like, but still experiences persistent pain. The patient has seen a gastroenterologist multiple times and has undergone a CT scan, which did not reveal any significant findings. The patient expresses frustration and distress over the unresolved symptoms and the current treatment plan, which includes regular cleanses and medication that causes diarrhea. The patient also reports that the symptoms interfere with daily activities, including work, due to the unpredictable timing of bowel movements after taking the medication. The patient has tried adjusting her diet, eating less and healthier, but still experiences symptoms, particularly after larger meals. The patient also reports having ulcers and is concerned about the impact of menopause on her gastrointestinal issues. The patient is currently on amitriptyline for bladder issues and is considering whether this medication may be contributing to the gastrointestinal symptoms.         Past Medical History:  Diagnosis Date   Asthma    Blood in urine 08/31/2023   Chronic interstitial cystitis    Complication of anesthesia    hard to wake after anesthesia   Constipation 08/31/2023   Endometriosis    Fatty liver    Headache    Hyperlipidemia    IBS (irritable bowel syndrome)    Ileitis    IUD (intrauterine device) in place    Mirena, Dr. Pennie Rushing   NAFLD (nonalcoholic fatty liver disease)    Other psoriasis     Past Surgical History:  Procedure Laterality Date   CESAREAN SECTION     2008, 2010   CHOLECYSTECTOMY N/A 07/07/2017   Procedure: LAPAROSCOPIC CHOLECYSTECTOMY;  Surgeon: Abigail Miyamoto, MD;  Location: [facility];  Service: General;  Laterality: N/A;   CYSTO WITH HYDRODISTENSION  03/29/2012   Procedure: CYSTOSCOPY/HYDRODISTENSION;  Surgeon: Martina Sinner, MD;  Location: [facility];  Service: Urology;  Laterality: N/A;  with Instillation of Marcaine and Pyridium     ESOPHAGOGASTRODUODENOSCOPY  2018   LAPAROSCOPY  03/29/2012   Procedure: LAPAROSCOPY OPERATIVE;  Surgeon: Hal Morales, MD;  Location: [facility];  Service: Gynecology;  Laterality: N/A;  with Fulguration of Endometriosis and Peritoneal Biopsy   TONSILLECTOMY     TUBAL LIGATION  2010   UMBILICAL HERNIA REPAIR  10/2010    Family History  Problem Relation Age of Onset   Hyperlipidemia Mother    Allergic rhinitis Mother    Alzheimer's disease Father    Hyperlipidemia Sister    Diabetes Paternal Uncle    Heart disease Other        paternal family   Colon cancer Neg Hx    Breast cancer Neg Hx    Esophageal cancer Neg Hx    Rectal cancer Neg Hx    Stomach cancer Neg Hx     Social History   Tobacco Use   Smoking status: Never   Smokeless tobacco: Never  Vaping Use   Vaping status: Never Used  Substance Use Topics   Alcohol use: Yes    Comment: rarely    Drug use: No     Allergies  Allergen Reactions   Morphine Itching   Morphine And Codeine Itching and Other (See Comments)   Meloxicam Other (See Comments)    bloating  Causes gas    bloating    Review of Systems NEGATIVE UNLESS OTHERWISE INDICATED IN HPI      Objective:     BP 104/70 (BP Location: Left  Arm, Patient Position: Sitting)   Pulse (!) 103   Temp 97.7 F (36.5 C) (Temporal)   Ht 4\' 11"  (1.499 m)   Wt 142 lb (64.4 kg)   SpO2 97%   BMI 28.68 kg/m   Wt Readings from Last 3 Encounters:  08/31/23 142 lb (64.4 kg)  07/26/23 148 lb (67.1 kg)  03/22/23 139 lb 14.4 oz (63.5 kg)    BP Readings from Last 3 Encounters:  08/31/23 104/70  07/26/23 120/80  03/22/23 104/62     Physical Exam Vitals and nursing note reviewed.  Constitutional:      Appearance: Normal appearance. She is normal weight. She is not toxic-appearing.  HENT:     Head: Normocephalic and atraumatic.     Right Ear: Tympanic membrane, ear canal and external ear normal.     Left Ear: Tympanic membrane, ear canal and external  ear normal.     Nose: Nose normal.     Mouth/Throat:     Mouth: Mucous membranes are moist.  Eyes:     Extraocular Movements: Extraocular movements intact.     Conjunctiva/sclera: Conjunctivae normal.     Pupils: Pupils are equal, round, and reactive to light.  Cardiovascular:     Rate and Rhythm: Normal rate and regular rhythm.     Pulses: Normal pulses.     Heart sounds: Normal heart sounds.  Pulmonary:     Effort: Pulmonary effort is normal.     Breath sounds: Normal breath sounds.  Abdominal:     General: Abdomen is flat. Bowel sounds are normal.     Palpations: Abdomen is soft. There is no mass.     Tenderness: There is abdominal tenderness (right lower abdomen). There is no right CVA tenderness, left CVA tenderness, guarding or rebound.  Musculoskeletal:        General: Normal range of motion.     Cervical back: Normal range of motion and neck supple.  Skin:    General: Skin is warm and dry.  Neurological:     General: No focal deficit present.     Mental Status: She is alert and oriented to person, place, and time.  Psychiatric:        Mood and Affect: Mood normal.        Behavior: Behavior normal.        Thought Content: Thought content normal.        Judgment: Judgment normal.            Katheryn Culliton M Freyja Govea, PA-C

## 2023-09-01 ENCOUNTER — Telehealth: Payer: Self-pay | Admitting: Physician Assistant

## 2023-09-01 NOTE — Telephone Encounter (Signed)
LVM informing need to re-collect blood sample. I informed her that a lab appointment would need to be scheduled so we can re-collect.

## 2023-09-01 NOTE — Patient Instructions (Signed)
VISIT SUMMARY:  During today's visit, we discussed your ongoing right-sided abdominal pain and blockage despite your current medication. We also reviewed your history of endometriosis, irritable bowel syndrome, and concerns about menopause and alcohol intolerance. We talked about your frustration with the current treatment plan and its impact on your daily life.  YOUR PLAN:  -GASTROINTESTINAL PAIN: You are experiencing chronic right-sided abdominal pain despite having bowel movements twice daily with your current medication, Linzess 145mg . We discussed the possibility of consulting with a urogynecologist or neurologist to explore alternatives to Amitriptyline, which might be contributing to your symptoms. Additionally, you should talk to your gastroenterologist about the potential impact of menopause on your symptoms and whether they might improve post-menopause.  -ENDOMETRIOSIS: Your endometriosis is currently managed with a Mirena IUD, which is due for removal in April. Although you are not experiencing pain now, you are concerned about a potential increase in symptoms after the IUD is removed. We recommend consulting with your gynecologist to discuss management options after the removal of the Mirena IUD.  -PERIMENOPAUSE: You are experiencing minimal hot flashes and managing vaginal dryness with a non-hormonal lubricant. You inquired about potential treatments for menopause. We encourage you to discuss with your gynecologist the potential need for hormone replacement therapy and its possible impact on your gastrointestinal symptoms.  -ALCOHOL INTOLERANCE: You reported feeling nauseous and unwell after consuming wine. We have ordered a liver panel to assess your liver function.  -GENERAL HEALTH MAINTENANCE: We encourage you to schedule a mammogram at your preferred location to keep up with your routine health screenings.  INSTRUCTIONS:  Please follow up with a urogynecologist or neurologist to  discuss alternatives to Amitriptyline. Additionally, schedule an appointment with your gastroenterologist to talk about the impact of menopause on your symptoms. Consult with your gynecologist regarding management options post-Mirena IUD removal and the potential need for hormone replacement therapy. Lastly, schedule a mammogram and complete the liver panel as ordered.

## 2023-09-07 ENCOUNTER — Other Ambulatory Visit: Payer: BC Managed Care – PPO

## 2023-09-07 ENCOUNTER — Encounter: Payer: Self-pay | Admitting: Physician Assistant

## 2023-09-07 LAB — CBC WITH DIFFERENTIAL/PLATELET
Basophils Absolute: 0 10*3/uL (ref 0.0–0.1)
Basophils Relative: 0.5 % (ref 0.0–3.0)
Eosinophils Absolute: 0.1 10*3/uL (ref 0.0–0.7)
Eosinophils Relative: 1.4 % (ref 0.0–5.0)
HCT: 41.2 % (ref 36.0–46.0)
Hemoglobin: 14.1 g/dL (ref 12.0–15.0)
Lymphocytes Relative: 29.1 % (ref 12.0–46.0)
Lymphs Abs: 2 10*3/uL (ref 0.7–4.0)
MCHC: 34.3 g/dL (ref 30.0–36.0)
MCV: 88 fL (ref 78.0–100.0)
Monocytes Absolute: 0.4 10*3/uL (ref 0.1–1.0)
Monocytes Relative: 6.1 % (ref 3.0–12.0)
Neutro Abs: 4.3 10*3/uL (ref 1.4–7.7)
Neutrophils Relative %: 62.9 % (ref 43.0–77.0)
Platelets: 260 10*3/uL (ref 150.0–400.0)
RBC: 4.68 Mil/uL (ref 3.87–5.11)
RDW: 13.6 % (ref 11.5–15.5)
WBC: 6.8 10*3/uL (ref 4.0–10.5)

## 2023-09-07 LAB — COMPREHENSIVE METABOLIC PANEL
ALT: 29 U/L (ref 0–35)
AST: 21 U/L (ref 0–37)
Albumin: 4.4 g/dL (ref 3.5–5.2)
Alkaline Phosphatase: 63 U/L (ref 39–117)
BUN: 13 mg/dL (ref 6–23)
CO2: 24 meq/L (ref 19–32)
Calcium: 9.4 mg/dL (ref 8.4–10.5)
Chloride: 105 meq/L (ref 96–112)
Creatinine, Ser: 0.54 mg/dL (ref 0.40–1.20)
GFR: 108.42 mL/min (ref >60.00)
Glucose, Bld: 100 mg/dL — ABNORMAL HIGH (ref 70–99)
Potassium: 3.7 meq/L (ref 3.5–5.1)
Sodium: 137 meq/L (ref 135–145)
Total Bilirubin: 1 mg/dL (ref 0.2–1.2)
Total Protein: 7.1 g/dL (ref 6.0–8.3)

## 2023-09-07 LAB — VITAMIN D 25 HYDROXY (VIT D DEFICIENCY, FRACTURES): VITD: 26.73 ng/mL — ABNORMAL LOW (ref 30.00–100.00)

## 2023-09-07 LAB — TSH: TSH: 1.24 u[IU]/mL (ref 0.35–5.50)

## 2023-09-07 LAB — HEMOGLOBIN A1C: Hgb A1c MFr Bld: 6 % (ref 4.6–6.5)

## 2023-09-07 LAB — VITAMIN B12: Vitamin B-12: 309 pg/mL (ref 211–911)

## 2023-09-09 ENCOUNTER — Encounter (HOSPITAL_BASED_OUTPATIENT_CLINIC_OR_DEPARTMENT_OTHER): Payer: Self-pay | Admitting: Radiology

## 2023-09-09 ENCOUNTER — Ambulatory Visit (HOSPITAL_BASED_OUTPATIENT_CLINIC_OR_DEPARTMENT_OTHER)
Admission: RE | Admit: 2023-09-09 | Discharge: 2023-09-09 | Disposition: A | Discharge status: Home / Self Care | Payer: BC Managed Care – PPO | Source: Ambulatory Visit

## 2023-09-09 DIAGNOSIS — Z1231 Encounter for screening mammogram for malignant neoplasm of breast: Secondary | ICD-10-CM | POA: Insufficient documentation

## 2023-09-18 ENCOUNTER — Other Ambulatory Visit: Payer: Self-pay | Admitting: Medical Genetics

## 2023-09-27 ENCOUNTER — Other Ambulatory Visit (HOSPITAL_COMMUNITY)
Admission: RE | Admit: 2023-09-27 | Discharge: 2023-09-27 | Disposition: A | Discharge status: Home / Self Care | Payer: Self-pay | Source: Ambulatory Visit | Attending: Medical Genetics | Admitting: Medical Genetics

## 2023-10-04 DIAGNOSIS — R1084 Generalized abdominal pain: Secondary | ICD-10-CM | POA: Diagnosis not present

## 2023-10-04 DIAGNOSIS — K581 Irritable bowel syndrome with constipation: Secondary | ICD-10-CM | POA: Diagnosis not present

## 2023-10-04 DIAGNOSIS — R1031 Right lower quadrant pain: Secondary | ICD-10-CM | POA: Diagnosis not present

## 2023-10-04 DIAGNOSIS — Z975 Presence of (intrauterine) contraceptive device: Secondary | ICD-10-CM | POA: Diagnosis not present

## 2023-10-04 DIAGNOSIS — R140 Abdominal distension (gaseous): Secondary | ICD-10-CM | POA: Diagnosis not present

## 2023-10-10 ENCOUNTER — Ambulatory Visit: Payer: BC Managed Care – PPO | Admitting: Dermatology

## 2023-10-10 LAB — GENECONNECT MOLECULAR SCREEN: Genetic Analysis Overall Interpretation: NEGATIVE

## 2023-10-25 DIAGNOSIS — M6289 Other specified disorders of muscle: Secondary | ICD-10-CM | POA: Diagnosis not present

## 2023-10-25 DIAGNOSIS — M6281 Muscle weakness (generalized): Secondary | ICD-10-CM | POA: Diagnosis not present

## 2023-10-25 DIAGNOSIS — M62838 Other muscle spasm: Secondary | ICD-10-CM | POA: Diagnosis not present

## 2023-10-25 DIAGNOSIS — R1032 Left lower quadrant pain: Secondary | ICD-10-CM | POA: Diagnosis not present

## 2023-10-27 NOTE — Progress Notes (Deleted)
 48 y.o. G6P2002 Married White or Caucasian female here for annual exam.    No LMP recorded. (Menstrual status: IUD).          Sexually active: {yes no:314532}  The current method of family planning is {contraception:315051}.     The pregnancy intention screening data noted above was reviewed. Potential methods of contraception were discussed. The patient elected to proceed with No data recorded.  Exercising: {yes no:314532}  {types:19826} Smoker:  {YES NO:22349}  Health Maintenance: Pap:  *** History of abnormal Pap:  {YES NO:22349} MMG:  09/09/2023 Colonoscopy:  12/26/2020 BMD:   *** Screening Labs: ***   reports that she has never smoked. She has never used smokeless tobacco. She reports current alcohol use. She reports that she does not use drugs.  Past Medical History:  Diagnosis Date   Asthma    Blood in urine 08/31/2023   Chronic interstitial cystitis    Complication of anesthesia    hard to wake after anesthesia   Constipation 08/31/2023   Endometriosis    Fatty liver    Headache    Hyperlipidemia    IBS (irritable bowel syndrome)    Ileitis    IUD (intrauterine device) in place    Mirena, Dr. Pennie Rushing   NAFLD (nonalcoholic fatty liver disease)    Other psoriasis     Past Surgical History:  Procedure Laterality Date   CESAREAN SECTION     2008, 2010   CHOLECYSTECTOMY N/A 07/07/2017   Procedure: LAPAROSCOPIC CHOLECYSTECTOMY;  Surgeon: Abigail Miyamoto, MD;  Location: [facility];  Service: General;  Laterality: N/A;   CYSTO WITH HYDRODISTENSION  03/29/2012   Procedure: CYSTOSCOPY/HYDRODISTENSION;  Surgeon: Martina Sinner, MD;  Location: [facility];  Service: Urology;  Laterality: N/A;  with Instillation of Marcaine and Pyridium    ESOPHAGOGASTRODUODENOSCOPY  2018   LAPAROSCOPY  03/29/2012   Procedure: LAPAROSCOPY OPERATIVE;  Surgeon: Hal Morales, MD;  Location: [facility];  Service: Gynecology;  Laterality: N/A;  with Fulguration of Endometriosis  and Peritoneal Biopsy   TONSILLECTOMY     TUBAL LIGATION  2010   UMBILICAL HERNIA REPAIR  10/2010    Current Outpatient Medications  Medication Sig Dispense Refill   amitriptyline (ELAVIL) 10 MG tablet Take 10 mg by mouth at bedtime.     Calcium Glycerophosphate (PRELIEF PO) Take by mouth. Before meals     famotidine (PEPCID) 20 MG tablet Take 20 mg by mouth daily. Only taking in morning     hydrOXYzine (ATARAX) 10 MG tablet Take 1 tablet by mouth at bedtime.     levonorgestrel (MIRENA) 20 MCG/24HR IUD 1 each by Intrauterine route once.     LINZESS 290 MCG CAPS capsule Take 290 mcg by mouth 2 (two) times daily.     montelukast (SINGULAIR) 10 MG tablet Take 1 tablet by mouth daily.     triamcinolone ointment (KENALOG) 0.1 % Use 1 application sparingly twice a day as needed to red itchy areas. Do not use on face, neck, groin, or armpit region 15 g 0   No current facility-administered medications for this visit.   Facility-Administered Medications Ordered in Other Visits  Medication Dose Route Frequency Provider Last Rate Last Admin   gadopentetate dimeglumine (MAGNEVIST) injection 13 mL  13 mL Intravenous Once PRN Anson Fret, MD        Family History  Problem Relation Age of Onset   Hyperlipidemia Mother    Allergic rhinitis Mother    Alzheimer's disease Father  Hyperlipidemia Sister    Diabetes Paternal Uncle    Heart disease Other        paternal family   Colon cancer Neg Hx    Breast cancer Neg Hx    Esophageal cancer Neg Hx    Rectal cancer Neg Hx    Stomach cancer Neg Hx     ROS: Constitutional: {Findings; ROS constitutional:30497::"negative"} Genitourinary:{Findings; ROS genitourinary:19593::"negative"}  Exam:   There were no vitals taken for this visit.     General appearance: alert, cooperative and appears stated age Head: Normocephalic, without obvious abnormality, atraumatic Neck: no adenopathy, supple, symmetrical, trachea midline and thyroid {EXAM;  THYROID:18604} Lungs: clear to auscultation bilaterally Breasts: {Exam; breast:13139::"normal appearance, no masses or tenderness"} Heart: regular rate and rhythm Abdomen: soft, non-tender; bowel sounds normal; no masses,  no organomegaly Extremities: extremities normal, atraumatic, no cyanosis or edema Skin: Skin color, texture, turgor normal. No rashes or lesions Lymph nodes: Cervical, supraclavicular, and axillary nodes normal. No abnormal inguinal nodes palpated Neurologic: Grossly normal   Pelvic: External genitalia:  no lesions              Urethra:  normal appearing urethra with no masses, tenderness or lesions              Bartholins and Skenes: normal                 Vagina: normal appearing vagina with normal color and no discharge, no lesions              Cervix: {exam; cervix:14595}              Pap taken: {yes no:314532} Bimanual Exam:  Uterus:  {exam; uterus:12215}              Adnexa: {exam; adnexa:12223}               Rectovaginal: Confirms               Anus:  normal sphincter tone, no lesions  Chaperone, ***, CMA, was present for exam.  Assessment/Plan:

## 2023-10-28 ENCOUNTER — Other Ambulatory Visit: Payer: Self-pay

## 2023-10-28 ENCOUNTER — Telehealth: Payer: Self-pay | Admitting: Physician Assistant

## 2023-10-28 DIAGNOSIS — Z131 Encounter for screening for diabetes mellitus: Secondary | ICD-10-CM

## 2023-10-28 DIAGNOSIS — E782 Mixed hyperlipidemia: Secondary | ICD-10-CM

## 2023-10-28 NOTE — Telephone Encounter (Signed)
 Contacted pt and scheduled lab visit

## 2023-10-28 NOTE — Telephone Encounter (Signed)
 Returned pt call and advised future lab orders placed; please schedule lab only appt 1 wk prior to June appt. Pt aware to be fasting and verbalized understanding

## 2023-10-28 NOTE — Telephone Encounter (Signed)
 Patient requesting to have labs ( A1c) a week prior to 6 mo f/u so that they can be reviewed during f/u . Please advise .

## 2023-11-03 ENCOUNTER — Encounter (HOSPITAL_BASED_OUTPATIENT_CLINIC_OR_DEPARTMENT_OTHER): Payer: BC Managed Care – PPO | Admitting: Certified Nurse Midwife

## 2023-11-09 DIAGNOSIS — M6289 Other specified disorders of muscle: Secondary | ICD-10-CM | POA: Diagnosis not present

## 2023-11-09 DIAGNOSIS — M62838 Other muscle spasm: Secondary | ICD-10-CM | POA: Diagnosis not present

## 2023-11-09 DIAGNOSIS — M6281 Muscle weakness (generalized): Secondary | ICD-10-CM | POA: Diagnosis not present

## 2023-11-09 DIAGNOSIS — R1032 Left lower quadrant pain: Secondary | ICD-10-CM | POA: Diagnosis not present

## 2023-11-18 DIAGNOSIS — L910 Hypertrophic scar: Secondary | ICD-10-CM | POA: Diagnosis not present

## 2023-11-18 DIAGNOSIS — M6289 Other specified disorders of muscle: Secondary | ICD-10-CM | POA: Diagnosis not present

## 2023-11-18 DIAGNOSIS — R102 Pelvic and perineal pain: Secondary | ICD-10-CM | POA: Diagnosis not present

## 2023-11-18 DIAGNOSIS — K5900 Constipation, unspecified: Secondary | ICD-10-CM | POA: Diagnosis not present

## 2023-12-08 DIAGNOSIS — M6289 Other specified disorders of muscle: Secondary | ICD-10-CM | POA: Diagnosis not present

## 2023-12-08 DIAGNOSIS — M62838 Other muscle spasm: Secondary | ICD-10-CM | POA: Diagnosis not present

## 2023-12-08 DIAGNOSIS — M6281 Muscle weakness (generalized): Secondary | ICD-10-CM | POA: Diagnosis not present

## 2023-12-08 DIAGNOSIS — R1032 Left lower quadrant pain: Secondary | ICD-10-CM | POA: Diagnosis not present

## 2023-12-20 DIAGNOSIS — R1084 Generalized abdominal pain: Secondary | ICD-10-CM | POA: Diagnosis not present

## 2023-12-20 DIAGNOSIS — K5900 Constipation, unspecified: Secondary | ICD-10-CM | POA: Diagnosis not present

## 2023-12-20 DIAGNOSIS — R1031 Right lower quadrant pain: Secondary | ICD-10-CM | POA: Diagnosis not present

## 2023-12-20 DIAGNOSIS — K581 Irritable bowel syndrome with constipation: Secondary | ICD-10-CM | POA: Diagnosis not present

## 2023-12-22 DIAGNOSIS — K5900 Constipation, unspecified: Secondary | ICD-10-CM | POA: Diagnosis not present

## 2023-12-22 DIAGNOSIS — M62838 Other muscle spasm: Secondary | ICD-10-CM | POA: Diagnosis not present

## 2023-12-22 DIAGNOSIS — M6289 Other specified disorders of muscle: Secondary | ICD-10-CM | POA: Diagnosis not present

## 2023-12-22 DIAGNOSIS — M6281 Muscle weakness (generalized): Secondary | ICD-10-CM | POA: Diagnosis not present

## 2023-12-24 ENCOUNTER — Encounter (INDEPENDENT_AMBULATORY_CARE_PROVIDER_SITE_OTHER): Payer: Self-pay

## 2024-01-11 ENCOUNTER — Ambulatory Visit: Payer: BC Managed Care – PPO | Admitting: Dermatology

## 2024-01-11 ENCOUNTER — Encounter: Payer: Self-pay | Admitting: Dermatology

## 2024-01-11 DIAGNOSIS — L409 Psoriasis, unspecified: Secondary | ICD-10-CM | POA: Diagnosis not present

## 2024-01-11 DIAGNOSIS — L578 Other skin changes due to chronic exposure to nonionizing radiation: Secondary | ICD-10-CM

## 2024-01-11 DIAGNOSIS — D229 Melanocytic nevi, unspecified: Secondary | ICD-10-CM

## 2024-01-11 DIAGNOSIS — L820 Inflamed seborrheic keratosis: Secondary | ICD-10-CM | POA: Diagnosis not present

## 2024-01-11 DIAGNOSIS — W908XXA Exposure to other nonionizing radiation, initial encounter: Secondary | ICD-10-CM

## 2024-01-11 DIAGNOSIS — D1801 Hemangioma of skin and subcutaneous tissue: Secondary | ICD-10-CM

## 2024-01-11 DIAGNOSIS — L814 Other melanin hyperpigmentation: Secondary | ICD-10-CM

## 2024-01-11 DIAGNOSIS — L821 Other seborrheic keratosis: Secondary | ICD-10-CM

## 2024-01-11 DIAGNOSIS — D485 Neoplasm of uncertain behavior of skin: Secondary | ICD-10-CM

## 2024-01-11 DIAGNOSIS — Z1283 Encounter for screening for malignant neoplasm of skin: Secondary | ICD-10-CM | POA: Diagnosis not present

## 2024-01-11 DIAGNOSIS — D492 Neoplasm of unspecified behavior of bone, soft tissue, and skin: Secondary | ICD-10-CM

## 2024-01-11 MED ORDER — CLOBETASOL PROPIONATE 0.05 % EX SOLN: 1.0000 | Freq: Two times a day (BID) | CUTANEOUS | 6 refills | Status: DC

## 2024-01-11 NOTE — Patient Instructions (Addendum)
 Date: Wed Jan 11 2024  Hello Sierra Hodge,  Thank you for visiting today. Here is a summary of the key instructions:  - Skin Care:   - Apply sunscreen daily, especially on face and chest   - Reapply sunscreen every 3-4 hours when at the beach   - Use Neutrogena Wrinkle Repair with retinol every night and morning  - Biopsy Care:   - Keep Band-Aid on for a week, changing it daily   - Wash face normally   - Apply Aquaphor ointment to biopsy site   - Leave Band-Aid on while at work  - Psoriasis Treatment:   - Use clobetasol  cream on knees and elbows for 2 weeks, then stop for 2 weeks   - Apply clobetasol  liquid drops under affected nail once a day  - Follow-up:   - Return in 8 weeks for nail check   - Take a picture of affected nail today and send via MyChart   - Remove nail polish before next appointment  - Medications:   - Samples of moisturizers with sunscreen will be provided   - Clobetasol  liquid for nail psoriasis will be prescribed  - Test Results:   - Biopsy results will be available in about a week   - If benign, results will be sent via MyChart   - If skin cancer, you will receive a phone call  Please reach out if you have any questions or concerns.  Warm regards,  Dr. Louana Roup, Dermatology    Patient Handout: Wound Care for Skin Biopsy Site  Taking Care of Your Skin Biopsy Site  Proper care of the biopsy site is essential for promoting healing and minimizing scarring. This handout provides instructions on how to care for your biopsy site to ensure optimal recovery.  1. Cleaning the Wound:  Clean the biopsy site daily with gentle soap and water. Gently pat the area dry with a clean, soft towel. Avoid harsh scrubbing or rubbing the area, as this can irritate the skin and delay healing.  2. Applying Aquaphor and Bandage:  After cleaning the wound, apply a thin layer of Aquaphor ointment to the biopsy site. Cover the area with a sterile bandage to protect  it from dirt, bacteria, and friction. Change the bandage daily or as needed if it becomes soiled or wet.  3. Continued Care for One Week:  Repeat the cleaning, Aquaphor application, and bandaging process daily for one week following the biopsy procedure. Keeping the wound clean and moist during this initial healing period will help prevent infection and promote optimal healing.  4. Massaging Aquaphor into the Area:  ---After one week, discontinue the use of bandages but continue to apply Aquaphor to the biopsy site. ----Gently massage the Aquaphor into the area using circular motions. ---Massaging the skin helps to promote circulation and prevent the formation of scar tissue.   Additional Tips:  Avoid exposing the biopsy site to direct sunlight during the healing process, as this can cause hyperpigmentation or worsen scarring. If you experience any signs of infection, such as increased redness, swelling, warmth, or drainage from the wound, contact your healthcare provider immediately. Follow any additional instructions provided by your healthcare provider for caring for the biopsy site and managing any discomfort. Conclusion:  Taking proper care of your skin biopsy site is crucial for ensuring optimal healing and minimizing scarring. By following these instructions for cleaning, applying Aquaphor, and massaging the area, you can promote a smooth and successful recovery. If you have  any questions or concerns about caring for your biopsy site, don't hesitate to contact your healthcare provider for guidance.     Important Information   Due to recent changes in healthcare laws, you may see results of your pathology and/or laboratory studies on MyChart before the doctors have had a chance to review them. We understand that in some cases there may be results that are confusing or concerning to you. Please understand that not all results are received at the same time and often the doctors may  need to interpret multiple results in order to provide you with the best plan of care or course of treatment. Therefore, we ask that you please give us  2 business days to thoroughly review all your results before contacting the office for clarification. Should we see a critical lab result, you will be contacted sooner.     If You Need Anything After Your Visit   If you have any questions or concerns for your doctor, please call our main line at [phone]. If no one answers, please leave a voicemail as directed and we will return your call as soon as possible. Messages left after 4 pm will be answered the following business day.    You may also send us  a message via MyChart. We typically respond to MyChart messages within 1-2 business days.  For prescription refills, please ask your pharmacy to contact our office. Our fax number is [phone].  If you have an urgent issue when the clinic is closed that cannot wait until the next business day, you can page your doctor at the number below.     Please note that while we do our best to be available for urgent issues outside of office hours, we are not available 24/7.    If you have an urgent issue and are unable to reach us , you may choose to seek medical care at your doctor's office, retail clinic, urgent care center, or emergency room.   If you have a medical emergency, please immediately call 911 or go to the emergency department. In the event of inclement weather, please call our main line at [phone] for an update on the status of any delays or closures.  Dermatology Medication Tips: Please keep the boxes that topical medications come in in order to help keep track of the instructions about where and how to use these. Pharmacies typically print the medication instructions only on the boxes and not directly on the medication tubes.   If your medication is too expensive, please contact our office at [phone] or send us  a message  through MyChart.    We are unable to tell what your co-pay for medications will be in advance as this is different depending on your insurance coverage. However, we may be able to find a substitute medication at lower cost or fill out paperwork to get insurance to cover a needed medication.    If a prior authorization is required to get your medication covered by your insurance company, please allow us  1-2 business days to complete this process.   Drug prices often vary depending on where the prescription is filled and some pharmacies may offer cheaper prices.   The website www.goodrx.com contains coupons for medications through different pharmacies. The prices here do not account for what the cost may be with help from insurance (it may be cheaper with your insurance), but the website can give you the price if you did not use any insurance.  - You can  print the associated coupon and take it with your prescription to the pharmacy.  - You may also stop by our office during regular business hours and pick up a GoodRx coupon card.  - If you need your prescription sent electronically to a different pharmacy, notify our office through [facility] or by phone at [phone]

## 2024-01-11 NOTE — Progress Notes (Signed)
 New Patient Visit   Subjective  Sierra Hodge is a 48 y.o. female who presents for the following: Total Body Skin Exam (TBSE)  Patient present today for new patient visit for TBSE.The patient reports she has spots, moles and lesions to be evaluated, some may be new or changing and the patient may have concern these could be cancer. 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 Patient denies family history of skin cancers. Patient reports throughout her lifetime has had  severe  sun exposure. Currently, patient reports if she has excessive sun exposure, she does apply sunscreen and/or wears protective coverings.  The following portions of the chart were reviewed this encounter and updated as appropriate: medications, allergies, medical history  Review of Systems:  No other skin or systemic complaints except as noted in HPI or Assessment and Plan.  Objective  Well appearing patient in no apparent distress; mood and affect are within normal limits.  A full examination was performed including scalp, head, eyes, ears, nose, lips, neck, chest, axillae, abdomen, back, buttocks, bilateral upper extremities, bilateral lower extremities, hands, feet, fingers, toes, fingernails, and toenails. All findings within normal limits unless otherwise noted below.   Relevant exam findings are noted in the Assessment and Plan.        Right Malar Cheek 4.5 mm Pink Brown lesion  Assessment & Plan   LENTIGINES, SEBORRHEIC KERATOSES, HEMANGIOMAS - Benign normal skin lesions - Benign-appearing - Call for any changes  MELANOCYTIC NEVI - Tan-brown and/or pink-flesh-colored symmetric macules and papules - Benign appearing on exam today - Observation - Call clinic for new or changing moles - Recommend daily use of broad spectrum spf 30+ sunscreen to sun-exposed areas.   ACTINIC DAMAGE - Chronic  condition, secondary to cumulative UV/sun exposure - diffuse scaly erythematous macules with underlying dyspigmentation - Recommend daily broad spectrum sunscreen SPF 30+ to sun-exposed areas, reapply every 2 hours as needed.  - Staying in the shade or wearing long sleeves, sun glasses (UVA+UVB protection) and wide brim hats (4-inch brim around the entire circumference of the hat) are also recommended for sun protection.  - Call for new or changing lesions.  PSORIASIS Exam: Well-demarcated erythematous papules/plaques with silvery scale, guttate pink scaly papules located on B/L Knees and Elbows. 10% BSA.  Well controlled   Patient denies joint pain  Psoriasis is a chronic non-curable, but treatable genetic/hereditary disease that may have other systemic features affecting other organ systems such as joints (Psoriatic Arthritis). It is associated with an increased risk of inflammatory bowel disease, heart disease, non-alcoholic fatty liver disease, and depression.  Treatments include light and laser treatments; topical medications; and systemic medications including oral and injectables.  Treatment Plan: - Recommended applying clobetasol  cream to the effected areas on the body - We will plan to prescribe Clobetasol  Solution to apply to the nails 2 times daily for 2 weeks then stop - Advised to remove nail polish so we can fully examine the nails at follow up visit - We will plan to follow up in 3 months to re-assess  SKIN CANCER SCREENING PERFORMED TODAY NEOPLASM OF UNCERTAIN BEHAVIOR OF SKIN Right Malar Cheek Epidermal / dermal shaving  Lesion diameter (cm):  0.5 Informed consent: discussed and consent obtained   Timeout: patient name, date of birth, surgical site, and procedure verified   Procedure prep:  Patient was prepped and draped in  usual sterile fashion Prep type:  Isopropyl alcohol  Anesthesia: the lesion was anesthetized in a standard fashion   Anesthetic:  1% lidocaine  w/  epinephrine  1-100,000 buffered w/ 8.4% NaHCO3 Instrument used: DermaBlade and flexible razor blade   Hemostasis achieved with: pressure, aluminum chloride and electrodesiccation   Outcome: patient tolerated procedure well   Post-procedure details: sterile dressing applied and wound care instructions given   Dressing type: petrolatum   Additional details:  Pt aware that benign results will be sent to mychart and the staff will call abnormal results will Specimen 1 - Surgical pathology Differential Diagnosis: Pigmented BCC vs ISK  Check Margins: No PSORIASIS   Related Medications clobetasol  (TEMOVATE ) 0.05 % external solution Apply 1 Application topically 2 (two) times daily.  Return in about 3 months (around 04/11/2024) for Psoriasis F/U.  Return in 1 year for TBSE  I, Jetta Ager, am acting as Neurosurgeon for [employer], DO.  Documentation: I have reviewed the above documentation for accuracy and completeness, and I agree with the above.  Louana Roup, DO

## 2024-01-12 DIAGNOSIS — M6289 Other specified disorders of muscle: Secondary | ICD-10-CM | POA: Diagnosis not present

## 2024-01-12 DIAGNOSIS — N952 Postmenopausal atrophic vaginitis: Secondary | ICD-10-CM | POA: Diagnosis not present

## 2024-01-12 DIAGNOSIS — R1031 Right lower quadrant pain: Secondary | ICD-10-CM | POA: Diagnosis not present

## 2024-01-13 ENCOUNTER — Encounter: Payer: Self-pay | Admitting: Dermatology

## 2024-01-16 LAB — SURGICAL PATHOLOGY

## 2024-01-23 ENCOUNTER — Encounter: Payer: Self-pay | Admitting: Dermatology

## 2024-01-24 DIAGNOSIS — L910 Hypertrophic scar: Secondary | ICD-10-CM | POA: Diagnosis not present

## 2024-01-24 DIAGNOSIS — M6289 Other specified disorders of muscle: Secondary | ICD-10-CM | POA: Diagnosis not present

## 2024-01-24 DIAGNOSIS — M62838 Other muscle spasm: Secondary | ICD-10-CM | POA: Diagnosis not present

## 2024-01-24 DIAGNOSIS — M6281 Muscle weakness (generalized): Secondary | ICD-10-CM | POA: Diagnosis not present

## 2024-02-03 DIAGNOSIS — M6289 Other specified disorders of muscle: Secondary | ICD-10-CM | POA: Diagnosis not present

## 2024-02-08 DIAGNOSIS — N9419 Other specified dyspareunia: Secondary | ICD-10-CM | POA: Diagnosis not present

## 2024-02-08 DIAGNOSIS — R102 Pelvic and perineal pain: Secondary | ICD-10-CM | POA: Diagnosis not present

## 2024-02-08 DIAGNOSIS — L910 Hypertrophic scar: Secondary | ICD-10-CM | POA: Diagnosis not present

## 2024-02-08 DIAGNOSIS — K5900 Constipation, unspecified: Secondary | ICD-10-CM | POA: Diagnosis not present

## 2024-02-21 ENCOUNTER — Other Ambulatory Visit (INDEPENDENT_AMBULATORY_CARE_PROVIDER_SITE_OTHER)

## 2024-02-21 DIAGNOSIS — E782 Mixed hyperlipidemia: Secondary | ICD-10-CM | POA: Diagnosis not present

## 2024-02-21 DIAGNOSIS — Z131 Encounter for screening for diabetes mellitus: Secondary | ICD-10-CM | POA: Diagnosis not present

## 2024-02-21 DIAGNOSIS — R1031 Right lower quadrant pain: Secondary | ICD-10-CM | POA: Diagnosis not present

## 2024-02-21 DIAGNOSIS — M62838 Other muscle spasm: Secondary | ICD-10-CM | POA: Diagnosis not present

## 2024-02-21 DIAGNOSIS — E559 Vitamin D deficiency, unspecified: Secondary | ICD-10-CM | POA: Diagnosis not present

## 2024-02-21 DIAGNOSIS — K5900 Constipation, unspecified: Secondary | ICD-10-CM | POA: Diagnosis not present

## 2024-02-21 DIAGNOSIS — M6289 Other specified disorders of muscle: Secondary | ICD-10-CM | POA: Diagnosis not present

## 2024-02-21 LAB — HEMOGLOBIN A1C: Hgb A1c MFr Bld: 5.7 % (ref 4.6–6.5)

## 2024-02-21 LAB — COMPREHENSIVE METABOLIC PANEL WITH GFR
ALT: 21 U/L (ref 0–35)
AST: 18 U/L (ref 0–37)
Albumin: 4.5 g/dL (ref 3.5–5.2)
Alkaline Phosphatase: 51 U/L (ref 39–117)
BUN: 7 mg/dL (ref 6–23)
CO2: 27 meq/L (ref 19–32)
Calcium: 9.6 mg/dL (ref 8.4–10.5)
Chloride: 102 meq/L (ref 96–112)
Creatinine, Ser: 0.61 mg/dL (ref 0.40–1.20)
GFR: 104.94 mL/min (ref >60.00)
Glucose, Bld: 77 mg/dL (ref 70–99)
Potassium: 3.6 meq/L (ref 3.5–5.1)
Sodium: 137 meq/L (ref 135–145)
Total Bilirubin: 1.2 mg/dL (ref 0.2–1.2)
Total Protein: 7.4 g/dL (ref 6.0–8.3)

## 2024-02-21 LAB — LIPID PANEL
Cholesterol: 202 mg/dL — ABNORMAL HIGH (ref 0–200)
HDL: 52.5 mg/dL (ref >39.00)
LDL Cholesterol: 135 mg/dL — ABNORMAL HIGH (ref 0–99)
NonHDL: 149.49
Total CHOL/HDL Ratio: 4
Triglycerides: 73 mg/dL (ref 0.0–149.0)
VLDL: 14.6 mg/dL (ref 0.0–40.0)

## 2024-02-21 LAB — VITAMIN D 25 HYDROXY (VIT D DEFICIENCY, FRACTURES): VITD: 38.22 ng/mL (ref 30.00–100.00)

## 2024-02-22 ENCOUNTER — Other Ambulatory Visit: Payer: BC Managed Care – PPO

## 2024-02-22 ENCOUNTER — Ambulatory Visit: Payer: Self-pay | Admitting: Physician Assistant

## 2024-02-23 ENCOUNTER — Other Ambulatory Visit

## 2024-02-28 ENCOUNTER — Encounter (HOSPITAL_BASED_OUTPATIENT_CLINIC_OR_DEPARTMENT_OTHER): Payer: Self-pay

## 2024-02-28 NOTE — Telephone Encounter (Signed)
 PCP drew labs for pt- please review

## 2024-02-29 ENCOUNTER — Ambulatory Visit: Payer: BC Managed Care – PPO | Admitting: Physician Assistant

## 2024-03-01 ENCOUNTER — Ambulatory Visit: Payer: BC Managed Care – PPO | Admitting: Physician Assistant

## 2024-03-06 ENCOUNTER — Ambulatory Visit: Admitting: Physician Assistant

## 2024-03-07 ENCOUNTER — Ambulatory Visit (HOSPITAL_BASED_OUTPATIENT_CLINIC_OR_DEPARTMENT_OTHER): Admitting: Nurse Practitioner

## 2024-03-07 VITALS — BP 96/62 | HR 90 | Ht 59.0 in | Wt 133.0 lb

## 2024-03-07 DIAGNOSIS — Z7189 Other specified counseling: Secondary | ICD-10-CM

## 2024-03-07 DIAGNOSIS — K760 Fatty (change of) liver, not elsewhere classified: Secondary | ICD-10-CM | POA: Diagnosis not present

## 2024-03-07 DIAGNOSIS — E785 Hyperlipidemia, unspecified: Secondary | ICD-10-CM | POA: Diagnosis not present

## 2024-03-07 NOTE — Patient Instructions (Signed)
 Medication Instructions:   Your physician recommends that you continue on your current medications as directed. Please refer to the Current Medication list given to you today.   *If you need a refill on your cardiac medications before your next appointment, please call your pharmacy*  Lab Work:  Sent a message to remind me to send [place] PCP for December 12 appt to draw a NMR.   If you have labs (blood work) drawn today and your tests are completely normal, you will receive your results only by: MyChart Message (if you have MyChart) OR A paper copy in the mail If you have any lab test that is abnormal or we need to change your treatment, we will call you to review the results.  Testing/Procedures:  None ordered.  Follow-Up: At [facility], you and your health needs are our priority.  As part of our continuing mission to provide you with exceptional heart care, our providers are all part of one team.  This team includes your primary Cardiologist (physician) and Advanced Practice Providers or APPs (Physician Assistants and Nurse Practitioners) who all work together to provide you with the care you need, when you need it.  Your next appointment:   6 month(s)  Provider:   Slater Duncan, NP    We recommend signing up for the patient portal called MyChart.  Sign up information is provided on this After Visit Summary.  MyChart is used to connect with patients for Virtual Visits (Telemedicine).  Patients are able to view lab/test results, encounter notes, upcoming appointments, etc.  Non-urgent messages can be sent to your provider as well.   To learn more about what you can do with MyChart, go to [email or web address].   Other Instructions  Tackling Obesity with Lifestyle Changes  Obesity- What is it? And What can we do about it?  Obesity is a chronic complex disease defined as excessive fat deposits that can have a negative effect on our health. It can lead to many  other diseases including type 2 diabetes.  Weight gain occurs when the amount of energy (calories) we consume is greater than the amount we use.  When our energy output is greater than our energy input we lose weight. The basic concept is simple, but in reality, it's much more complicated.  Unfortunately, in some people, our bodies have many ways it can compensate when we try to eat less and move more which can prevent us  from changing our weight. This can lead to some people having a much more difficult time losing weight even when they put healthy habits into practice. This can be frustrating. We want to focus on healthy habits, physical activity and how we feel, and less the number on the scale.  Food As Energy  Calories  Calories is just a unit of measurement for energy.  Counting calories is not required to lose weight but counting for a short period of time can:   help you learn good portion sizes   Learn what your true energy needs are.   Help you be more aware of your snacking or grazing habits  To help calculate how many calories you should be eating, the NIH has a great body weight planner calculator at BeverageBuggy.si  Types of Energy Expenditure  Basal Metabolic Rate (BMR) Energy that our bodies use to preform everyday tasks. More muscle mass through resistance training can increase this a small amount  Thermic Effect of Food The amount of energy that it takes to  breakdown the food we eat. This will be highest when we eat protein and fiber rich foods  Exercise Energy Expenditure The amount of energy used during formal exercise (walking, biking, weightlifting)  Non-exercise activity thermogenesis (NEAT) The amount of energy spent on activities that are not formal exercise (standing, fidgeting). Therefore, it is not only important to do formal exercise but also move around throughout the day.  Managing The Meal  Macro nutrients (carbohydrates, fats and protein,  fiber, water)  Micronutrients (vitamins, minerals)  Dietary Fiber  Benefits Examples Cautions  Soluble fiber  Decreases cholesterol  improve blood sugar control,  Feeds our gut bacteria  Allows us  to feel fuller for longer so we eat less  fruits  oats  barley  legumes  peas  Beans  vegetables (broccoli) and root vegetables (carrots) Add fiber into your diet slowly and be sure to drink at least 8 cups of water a day. This will help limit gas, bloating, diarrhea, or constipation.  Insoluble Fiber  Improves digestive health by making stool easier to pass  Allows us  to feel fuller for longer so we eat less  whole grains  nuts  seeds  skin of fruit  vegetables (green beans, zucchini, cauliflower)  Tricks to add more fiber to your diet   Add beans (pinto, kidney, lima, navy and garbanzo) to salads, ground meat or brown rice   Add nuts or seeds and or fresh/frozen fruit to yogurt, cottage cheese, salads or steel cut oats   Cut up vegetables and eat with hummus   Look for unsweetened whole grain cereals with at least 5g of fiber per serving   Switch to whole grain bread. Look for bread that has whole grain flour as the first ingredient and has more fiber than carbs if you were to multiple the fiber x 10.   Try bulgar, barely, quinoa, buckwheat, brown rice wild rice instead of white rice   Keep frozen vegetables on hand to add to dishes or soups  Meal Planning:  Meal planning is the key to setting you up for success. Here are some examples of healthy meal options.  Breakfast  Option 1: Omelette with vegetables (1 egg, spinach, mushrooms, or other vegetable of your choice), 2 slices whole-grain toast, tip of thumb size butter or soft margarine,  cup low-fat milk or yogurt  Option 2: steel-cut rolled oats (? cup dry), 1 tbsp peanut butter added to cooked oats,  cup low-fat milk.  Option 3: 2 slices whole-grain or rye toast with avocado spread ( small avocado  mased with herbs and pepper to taste), 1 poached egg or [place] up (cooked to your liking)  Option 4:  cup plain 0% [place] yogurt topped with  cup berries and  cup walnuts or almonds, 2 slices whole-grain or rye toast, tip of thumb size soft margarine/butter  Lunch:  Option 1: 2 cups red lentil soup, green salad with 1 tbsp homemade vinaigrette (extra virgin olive oil and vinegar of choice plus spices)  Option 2: 3 oz. roasted chicken, 2 slices whole-grain bread, 2 tsp mayonnaise, mustard, lettuce, tomato if desired, 1 fruit (example: medium-sized apple or small pear)  Option 3: 3 oz. tuna packed in water, 1 whole-wheat pita (6 inch), 2 tsp mayonnaise, lettuce, tomato, or other non-starchy vegetable of your choice, 1 fruit (example: medium-sized apple or small pear)  Option 4: 1 serving of garden veggie buddha bowl with lentils and tahini sauce and 1 cup berries topped with  cup plain 0%  Greek yogurt  Dinner:  Option 1: 1 serving roasted cauliflower salad, 3-4 oz. grilled or baked pork loin chop, 1/2 cup mashed potato, or brown rice or quinoa  Option 2: 1 serving fish (baked, grilled or air fried), green salad, 1 tbsp homemade vinaigrette,  cup cooked couscous  Option 3: 1 cup cooked whole grained pasta (example: spaghetti, spirals, macaroni),  cup favorite pasta sauce (preferably homemade), 3-4 oz. grilled or baked chicken, green salad, 1 tbsp homemade vinaigrette  Option 4: 1 serving oven roasted salmon,  cup mashed sweet potato or couscous or brown rice or quinoa, broccoli (steamed or roasted)  Healthy snacks:   Carrots or celery with 1 tbsp of hummus   1 medium-sized fruit (apple or orange)   1 cup plain 0% [place] yogurt with  cup berries   Half apple, sliced, with 1 tbsp (15 mL) peanut or almond butter  Dining out:  Eating away from home has become a part of many people's lifestyle. Making healthy choices when you are eating out is important too. Portion size is  an important part of healthy choices. Most branded fast-food places provide calories, sodium, and fat content for their menu items. www.calorieking.com would be great resource to find nutrition facts for your favorite brands and fast-food restaurants. Company specific website can be Chief Technology Officer for nutrition information for their items. (e.g. www.mcdonalds.com or www.nutritionix.com/[place]/menu/premium)  Here are some tips to help you make wise food choices when you are dining out.  Chose more often Avoid  Beverages   Choose more often: Water, low fat milk  Sugar-free/diet drinks  Unsweet tea or coffee    Avoid: Milkshakes, fruit drinks, regular pop  Alcohol , specialty drinks (e.g. iced cappuccino)  Fast food  Choose more often:  Garden salad  Mini subs, pita sandwiches ect with extra vegetables  plain burgers, grilled chicken  Vegetarian or cheese pizza with whole-grain crust    Avoid: Burgers/sandwiches with bacon, cheese, and high-fat sauces  [place] fries, fried chicken, fried fish, poutine, hash browns  Pizza with processed meats  Starters   Choose more often: Raw vegetables, salads (garden, spinach, fruit)  clear or vegetable soups  Seafood cocktail  Whole-grain breads and rolls    Avoid: Salads with high-fat dressings or toppings  Creamy soups  Wings, egg rolls  onion rings, nachos  White or garlic bread  Main courses Grains & Starches (amount equal to  of your plate)  Choose more often:  Oatmeal, high-fiber/lower-sugar cereals  Whole-grain breads, rice, pasta, barley, couscous  Sweet potatoes    Avoid: Sugary, low-fiber cereals  Large bagels, muffins, croissants, white bread  [place] fries, hash browns, fried rice   Meat and alternative (amount equal to  of your plate)  Choose more often:  Lean meats, poultry, fish, eggs, low-fat cheese  Tofu, vegetable protein Legumes (e.g. lentils, chickpeas, beans)    Avoid: High-salt and/or high-fat  meats (e.g. ribs, wings, sausages, wieners, processed lunch meats, imposter meats)   Vegetables (amount equal to  of your plate)  Choose more often:  Salads ([place], garden, spinach), plain vegetables   Avoid:  Salads with creamy, high-fat dressings and   Vegetables on sandwiches ect toppings like bacon bits, croutons, cheese  Desserts  Choose more often:  Fresh fruit, frozen yogourt, skim milk latte    Avoid: Cakes, pies, pastries, ice cream, cheesecake       Mediterranean Diet  Why follow it? Research shows. Those who follow the Mediterranean diet have a reduced risk of  heart disease  The diet is associated with a reduced incidence of Parkinson's and Alzheimer's diseases People following the diet may have longer life expectancies and lower rates of chronic diseases  The Dietary Guidelines for Americans recommends the Mediterranean diet as an eating plan to promote health and prevent disease  What Is the Mediterranean Diet?  Healthy eating plan based on typical foods and recipes of Mediterranean-style cooking The diet is primarily a plant based diet; these foods should make up a majority of meals   Starches - Plant based foods should make up a majority of meals - They are an important sources of vitamins, minerals, energy, antioxidants, and fiber - Choose whole grains, foods high in fiber and minimally processed items  - Typical grain sources include wheat, oats, barley, corn, brown rice, bulgar, farro, millet, polenta, couscous  - Various types of beans include chickpeas, lentils, fava beans, black beans, white beans   Fruits  Veggies - Large quantities of antioxidant rich fruits & veggies; 6 or more servings  - Vegetables can be eaten raw or lightly drizzled with oil and cooked  - Vegetables common to the traditional Mediterranean Diet include: artichokes, arugula, beets, broccoli, brussel sprouts, cabbage, carrots, celery, collard greens, cucumbers, eggplant, kale, leeks,  lemons, lettuce, mushrooms, okra, onions, peas, peppers, potatoes, pumpkin, radishes, rutabaga, shallots, spinach, sweet potatoes, turnips, zucchini - Fruits common to the Mediterranean Diet include: apples, apricots, avocados, cherries, clementines, dates, figs, grapefruits, grapes, melons, nectarines, oranges, peaches, pears, pomegranates, strawberries, tangerines  Fats - Replace butter and margarine with healthy oils, such as olive oil, canola oil, and tahini  - Limit nuts to no more than a handful a day  - Nuts include walnuts, almonds, pecans, pistachios, pine nuts  - Limit or avoid candied, honey roasted or heavily salted nuts - Olives are central to the [employer] - can be eaten whole or used in a variety of dishes   Meats Protein - Limiting red meat: no more than a few times a month - When eating red meat: choose lean cuts and keep the portion to the size of deck of cards - Eggs: approx. 0 to 4 times a week  - Fish and lean poultry: at least 2 a week  - Healthy protein sources include, chicken, [place], lean beef, lamb - Increase intake of seafood such as tuna, salmon, trout, mackerel, shrimp, scallops - Avoid or limit high fat processed meats such as sausage and bacon  Dairy - Include moderate amounts of low fat dairy products  - Focus on healthy dairy such as fat free yogurt, skim milk, low or reduced fat cheese - Limit dairy products higher in fat such as whole or 2% milk, cheese, ice cream  Alcohol  - Moderate amounts of red wine is ok  - No more than 5 oz daily for women (all ages) and men older than age 50  - No more than 10 oz of wine daily for men younger than 96  Other - Limit sweets and other desserts  - Use herbs and spices instead of salt to flavor foods  - Herbs and spices common to the traditional Mediterranean Diet include: basil, bay leaves, chives, cloves, cumin, fennel, garlic, lavender, marjoram, mint, oregano, parsley, pepper, rosemary, sage, savory, sumac,  tarragon, thyme   It's not just a diet, it's a lifestyle:  The Mediterranean diet includes lifestyle factors typical of those in the region  Foods, drinks and meals are best eaten with others and savored Daily physical  activity is important for overall good health This could be strenuous exercise like running and aerobics This could also be more leisurely activities such as walking, housework, yard-work, or taking the stairs Moderation is the key; a balanced and healthy diet accommodates most foods and drinks Consider portion sizes and frequency of consumption of certain foods   Meal Ideas & Options:  Breakfast:  Whole wheat toast or whole wheat English muffins with peanut butter & hard boiled egg Steel cut oats topped with apples & cinnamon and skim milk  Fresh fruit: banana, strawberries, melon, berries, peaches  Smoothies: strawberries, bananas, greek yogurt, peanut butter Low fat greek yogurt with blueberries and granola  Egg white omelet with spinach and mushrooms Breakfast couscous: whole wheat couscous, apricots, skim milk, cranberries  Sandwiches:  Hummus and grilled vegetables (peppers, zucchini, squash) on whole wheat bread   Grilled chicken on whole wheat pita with lettuce, tomatoes, cucumbers or tzatziki  [place] salad on whole wheat bread: tuna salad made with greek yogurt, olives, red peppers, capers, green onions Garlic rosemary lamb pita: lamb sauted with garlic, rosemary, salt & pepper; add lettuce, cucumber, greek yogurt to pita - flavor with lemon juice and black pepper  Seafood:  Mediterranean grilled salmon, seasoned with garlic, basil, parsley, lemon juice and black pepper Shrimp, lemon, and spinach whole-grain pasta salad made with low fat greek yogurt  Seared scallops with lemon orzo  Seared tuna steaks seasoned salt, pepper, coriander topped with tomato mixture of olives, tomatoes, olive oil, minced garlic, parsley, green onions and cappers  Meats:  Herbed greek  chicken salad with kalamata olives, cucumber, feta  Red bell peppers stuffed with spinach, bulgur, lean ground beef (or lentils) & topped with feta   Kebabs: skewers of chicken, tomatoes, onions, zucchini, squash  [place] burgers: made with red onions, mint, dill, lemon juice, feta cheese topped with roasted red peppers Vegetarian Cucumber salad: cucumbers, artichoke hearts, celery, red onion, feta cheese, tossed in olive oil & lemon juice  Hummus and whole grain pita points with a greek salad (lettuce, tomato, feta, olives, cucumbers, red onion) Lentil soup with celery, carrots made with vegetable broth, garlic, salt and pepper  Tabouli salad: parsley, bulgur, mint, scallions, cucumbers, tomato, radishes, lemon juice, olive oil, salt and pepper.

## 2024-03-07 NOTE — Progress Notes (Signed)
 Cardiology Office Note   Date:  03/07/2024  ID:  Sierra Hodge, DOB 1976-03-14, MRN [id number] PCP: Kathrene Mardy HERO, PA-C  [facility] Providers Cardiologist:  None     PMH Dysliipidemia NAFLD Coronary calcium score of 0 (CT 05/09/23)  Referred to Advanced Lipid disorders clinic and seen by Dr. Mona 03/22/2023.  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  She reported history of dyslipidemia with lipid panel at that time revealing total cholesterol 270, triglycerides 65, HDL 48, and LDL 156.  She made dietary changes and increased exercise to lower cholesterol without much success.  History of remote heart disease in her family, particularly on her father side in her uncles but not necessarily her father.  Both parents have high cholesterol she believes.  10-year risk calculation 0.9% and lifetime risk calculation of 36%.  Genetic testing was discussed.  She had a coronary calcium score of 0 on 05/09/2023.  At follow-up visit 07/26/2023 cholesterol remained elevated with total cholesterol 213, triglycerides 90, HDL 50, and LDL 147.  LP(a) was negative.  Goal LDL less than 100.  She wanted to continue to work on diet and exercise before considering statin therapy.   History of Present Illness Discussed the use of AI scribe software for clinical note transcription with the patient, who gave verbal consent to proceed.  History of Present Illness Sierra Hodge is a very pleasant 48 year old female who is here today for follow-up of hyperlipidemia. She has elevated LDL cholesterol, which decreased from 156 to 135, despite improvements in glucose and triglycerides. She is implementing lifestyle changes, including daily biking and dietary modifications, to further lower her LDL.She experiences occasional chest pain, not associated with physical activity, and fluttery heart sensations linked to caffeine intake, which have resolved. No shortness of breath, palpitations,  orthopnea, PND, edema, presyncope, syncope. Her family history includes a cousin who recently had a heart attack at a young age.  She reports pain in her lower right quadrant which has been evaluated by GI and GYN with no apparent cause. She recalls someone telling her she had ulcers in her colon. She does have improvement in pain when bowel movements are regular.  We had a lengthy discussion about ASCVD risk and management.   ROS: See HPI  Studies Reviewed EKG Interpretation Date/Time:  Wednesday March 07 2024 15:22:20 EDT Ventricular Rate:  88 PR Interval:  132 QRS Duration:  76 QT Interval:  348 QTC Calculation: 421 R Axis:   14  Text Interpretation: Normal sinus rhythm with sinus arrhythmia Normal ECG No previous ECGs available Confirmed by Percy Browning [phone]) on 03/07/2024 3:53:50 PM     Lipoprotein (a)  Date/Time Value Ref Range Status  03/22/2023 10:54 AM <8.4 <75.0 nmol/L Final    Comment:    **Results verified by repeat testing** Note:  Values greater than or equal to 75.0 nmol/L may        indicate an independent risk factor for CHD,        but must be evaluated with caution when applied        to non-Caucasian populations due to the        influence of genetic factors on Lp(a) across        ethnicities.     Risk Assessment/Calculations           Physical Exam VS:  BP 96/62   Pulse 90   Ht 4' 11 (1.499 m)   Wt 133  lb (60.3 kg)   SpO2 98%   BMI 26.86 kg/m    Wt Readings from Last 3 Encounters:  03/07/24 133 lb (60.3 kg)  08/31/23 142 lb (64.4 kg)  07/26/23 148 lb (67.1 kg)    GEN: Well nourished, well developed in no acute distress NECK: No JVD; No carotid bruits CARDIAC: RRR, no murmurs, rubs, gallops RESPIRATORY:  Clear to auscultation without rales, wheezing or rhonchi  ABDOMEN: Soft, non-tender, non-distended EXTREMITIES:  No edema; No deformity    Assessment & Plan Hyperlipidemia  LDL goal < 100 LDL cholesterol is currently 135 mg/dL, with  a target of less than 100 given history of fatty liver. . Coronary calcium score is zero, and lipoprotein A is negative. Benefits of statins were discussed, but she prefers to manage her condition through dietary changes. Provided information on foods high in saturated fat and avoiding processed foods, sugar, and other simple carbohydrates. We will recheck lipid panel in 6 months. Consider statin therapy if LDL does not decrease with dietary changes.   NAFLD She has right lower quadrant abdominal pain but reports liver imaging has been stable.  Pain improved with regular bowel movements.  She has improved her diet and continues to aim to limit saturated fat, sugar, processed foods, and other simple carbohydrates.  Management of LDL emphasized as noted above.   Cardiac Risk Assessment ASCVD Risk is < 1%. We discussed the factors that impact the score and that it will increase with age.  BP is well-controlled with no history of hypertension, she does not have history of smoking or diabetes. Continue focus on prevention including heart healthy mostly plant based diet avoiding saturated fat, processed foods, simple carbohydrates, and sugar along with aiming for at least 150 minutes of moderate intensity exercise each week.         Dispo: 6  months with me  Signed, Rosaline Bane, NP-C

## 2024-03-08 ENCOUNTER — Encounter (HOSPITAL_BASED_OUTPATIENT_CLINIC_OR_DEPARTMENT_OTHER): Payer: Self-pay | Admitting: Nurse Practitioner

## 2024-03-13 ENCOUNTER — Ambulatory Visit: Payer: Self-pay | Admitting: Physician Assistant

## 2024-03-13 ENCOUNTER — Encounter: Payer: Self-pay | Admitting: Physician Assistant

## 2024-03-13 ENCOUNTER — Ambulatory Visit: Admitting: Physician Assistant

## 2024-03-13 VITALS — BP 116/68 | HR 66 | Temp 97.5°F | Resp 12 | Ht 59.0 in | Wt 134.6 lb

## 2024-03-13 DIAGNOSIS — R300 Dysuria: Secondary | ICD-10-CM | POA: Diagnosis not present

## 2024-03-13 DIAGNOSIS — R1013 Epigastric pain: Secondary | ICD-10-CM | POA: Diagnosis not present

## 2024-03-13 DIAGNOSIS — K633 Ulcer of intestine: Secondary | ICD-10-CM

## 2024-03-13 DIAGNOSIS — R5383 Other fatigue: Secondary | ICD-10-CM | POA: Diagnosis not present

## 2024-03-13 DIAGNOSIS — R110 Nausea: Secondary | ICD-10-CM

## 2024-03-13 DIAGNOSIS — R1084 Generalized abdominal pain: Secondary | ICD-10-CM

## 2024-03-13 LAB — COMPREHENSIVE METABOLIC PANEL WITH GFR
ALT: 21 U/L (ref 0–35)
AST: 18 U/L (ref 0–37)
Albumin: 4.5 g/dL (ref 3.5–5.2)
Alkaline Phosphatase: 55 U/L (ref 39–117)
BUN: 9 mg/dL (ref 6–23)
CO2: 30 meq/L (ref 19–32)
Calcium: 9.5 mg/dL (ref 8.4–10.5)
Chloride: 102 meq/L (ref 96–112)
Creatinine, Ser: 0.55 mg/dL (ref 0.40–1.20)
GFR: 107.55 mL/min (ref >60.00)
Glucose, Bld: 94 mg/dL (ref 70–99)
Potassium: 3.7 meq/L (ref 3.5–5.1)
Sodium: 137 meq/L (ref 135–145)
Total Bilirubin: 1.1 mg/dL (ref 0.2–1.2)
Total Protein: 7 g/dL (ref 6.0–8.3)

## 2024-03-13 LAB — CBC WITH DIFFERENTIAL/PLATELET
Basophils Absolute: 0 10*3/uL (ref 0.0–0.1)
Basophils Relative: 0.3 % (ref 0.0–3.0)
Eosinophils Absolute: 0.1 10*3/uL (ref 0.0–0.7)
Eosinophils Relative: 1.3 % (ref 0.0–5.0)
HCT: 41.5 % (ref 36.0–46.0)
Hemoglobin: 14 g/dL (ref 12.0–15.0)
Lymphocytes Relative: 23.3 % (ref 12.0–46.0)
Lymphs Abs: 2 10*3/uL (ref 0.7–4.0)
MCHC: 33.9 g/dL (ref 30.0–36.0)
MCV: 85.9 fl (ref 78.0–100.0)
Monocytes Absolute: 0.6 10*3/uL (ref 0.1–1.0)
Monocytes Relative: 6.4 % (ref 3.0–12.0)
Neutro Abs: 5.9 10*3/uL (ref 1.4–7.7)
Neutrophils Relative %: 68.7 % (ref 43.0–77.0)
Platelets: 272 10*3/uL (ref 150.0–400.0)
RBC: 4.83 Mil/uL (ref 3.87–5.11)
RDW: 13.6 % (ref 11.5–15.5)
WBC: 8.6 10*3/uL (ref 4.0–10.5)

## 2024-03-13 LAB — POC URINALSYSI DIPSTICK (AUTOMATED)
Bilirubin, UA: NEGATIVE
Glucose, UA: NEGATIVE
Ketones, UA: NEGATIVE
Leukocytes, UA: NEGATIVE
Nitrite, UA: NEGATIVE
Protein, UA: NEGATIVE
Spec Grav, UA: <=1.005 — AB (ref 1.010–1.025)
Urobilinogen, UA: 0.2 U/dL
pH, UA: 7 (ref 5.0–8.0)

## 2024-03-13 LAB — LIPASE: Lipase: 34 U/L (ref 11.0–59.0)

## 2024-03-13 LAB — SEDIMENTATION RATE: Sed Rate: 19 mm/h (ref 0–20)

## 2024-03-13 LAB — C-REACTIVE PROTEIN: CRP: <1 mg/dL (ref 0.5–20.0)

## 2024-03-13 NOTE — Progress Notes (Signed)
 Patient ID: Sierra Hodge, female    DOB: 1975/11/20, 48 y.o.   MRN: [id number]   Assessment & Plan:  Epigastric pain -     CBC with Differential/Platelet -     Comprehensive metabolic panel with GFR -     Sedimentation rate -     C-reactive protein -     Lipase -     POCT Urinalysis Dipstick (Automated) -     Calprotectin, Fecal -     Pancreatic elastase, fecal  Nausea -     CBC with Differential/Platelet -     Comprehensive metabolic panel with GFR -     Sedimentation rate -     C-reactive protein -     Lipase -     POCT Urinalysis Dipstick (Automated) -     Calprotectin, Fecal -     Pancreatic elastase, fecal  Other fatigue -     CBC with Differential/Platelet -     Comprehensive metabolic panel with GFR -     Sedimentation rate -     C-reactive protein -     Lipase -     POCT Urinalysis Dipstick (Automated) -     Calprotectin, Fecal -     Pancreatic elastase, fecal  Ileum ulcer -     Calprotectin, Fecal -     Pancreatic elastase, fecal  Generalized abdominal pain -     CBC with Differential/Platelet -     Comprehensive metabolic panel with GFR -     Sedimentation rate -     C-reactive protein -     Lipase -     POCT Urinalysis Dipstick (Automated) -     Calprotectin, Fecal -     Pancreatic elastase, fecal  Dysuria -     POCT Urinalysis Dipstick (Automated) -     Urine Culture       Assessment & Plan Nausea and Abdominal Pain Reports a week-long history of morning nausea and abdominal pain in the lower back and right side. Gallbladder removal and persistent reflux despite over-the-counter medication. CT scan from September 2024 indicated questionable mural thickening in the terminal ileum, suggesting possible Crohn's disease, ulcerative colitis, or infection. Ulcer in the colon. Suspected inflammatory bowel condition. Previous colonoscopy and CT findings warrant further investigation. Dissatisfaction with current gastroenterologist and seeks a second  opinion. - Order CBC, CMP, sed rate, CRP, and lipase level to assess for inflammation and pancreatic function - Perform urinalysis to rule out urinary tract infection - Refer to a Crohn's specialist for further evaluation - Dr. Mikle Martinez with [facility]  - Order fecal calprotectin test to assess for intestinal inflammation  Gastroesophageal Reflux Disease (GERD) Experiences reflux symptoms despite over-the-counter medication. Uses a specific brand from [facility], which she finds effective. - Continue current reflux medication - Monitor reflux symptoms and adjust treatment if necessary  Fatigue Reports significant fatigue despite adequate sleep. Recently used a vaginal suppository or valium, which may contribute to fatigue. Acknowledges potential systemic effects of the suppository. - Discontinue use of vaginal suppository if fatigue persists - Monitor fatigue symptoms and consider alternative treatments if necessary  General Health Maintenance Actively managing cholesterol and blood sugar levels through diet and exercise. Successfully lowered LDL cholesterol and HbA1c levels. Due for a tetanus shot, but advised to postpone until after current evaluation. - Postpone tetanus vaccination until after current evaluation - Continue current diet and exercise regimen to maintain cholesterol and blood sugar levels  Follow-up Follow up on  the results of the current tests and evaluations. Plans to reassess condition based on test results and consider further specialist referrals if necessary. - Follow up on test results and reassess condition - Consider further specialist referrals based on test outcomes      No follow-ups on file.    Subjective:    Chief Complaint  Patient presents with   Medical Management of Chronic Issues    HPI Discussed the use of AI scribe software for clinical note transcription with the patient, who gave verbal consent to proceed.  History of Present  Illness Sierra Hodge is a 48 year old female who presents with nausea, fatigue, and lower back pain.  She has been experiencing nausea primarily upon waking for the past week. This symptom previously occurred after consuming wine or beer, but she has since reduced her alcohol  intake. Despite taking her usual reflux medication in the morning, she continues to experience reflux symptoms. No vomiting is associated with the nausea, but she describes a yucky taste in her mouth.  She describes persistent fatigue despite getting 7-8 hours of sleep each night. This fatigue is unusual for her and has been severe enough to require naps, which is atypical for her. The symptoms began after a deviation from her usual diet, including increased carbohydrate and red meat intake. She has experienced significant weight loss, approximately 10 pounds, and associates feeling sick and constipated with carbohydrate intake. She has been avoiding certain foods, including red meat, which she believes exacerbates her symptoms. She engages in daily physical activity, such as biking for 30 minutes.  She reports lower back pain, which she has experienced for a long time, initially attributing it to gallbladder removal. However, the pain persists despite the absence of her gallbladder. A CT scan performed due to her pain showed no significant findings according to her gastroenterologist.  She reports increased urinary frequency and discomfort, raising concerns about a possible urinary tract infection. She is worried about potential kidney issues, as her mother has kidney problems.  She has been using a vaginal suppository and estrogen cream prescribed by her gynecologist for tight muscles, but she stopped the cream. She feels the suppository makes her very sleepy, which is concerning to her.  She has a family history of liver issues, as her grandfather died from cirrhosis. She is concerned about her liver, kidney, or pancreas  health, although recent liver function tests were normal. She has not had her pancreas levels checked recently.  She recalls a previous endoscopy and colonoscopy, with the latter revealing an ulcer in her colon. She feels her concerns about persistent pain and potential Crohn's disease or IBS are not being adequately addressed by her current gastroenterologist.     Past Medical History:  Diagnosis Date   Asthma    Blood in urine 08/31/2023   Chronic interstitial cystitis    Complication of anesthesia    hard to wake after anesthesia   Constipation 08/31/2023   Endometriosis    Fatty liver    Headache    Hyperlipidemia    IBS (irritable bowel syndrome)    Ileitis    IUD (intrauterine device) in place    Mirena , Dr. Raeanne   NAFLD (nonalcoholic fatty liver disease)    Other psoriasis     Past Surgical History:  Procedure Laterality Date   CESAREAN SECTION     2008, 2010   CHOLECYSTECTOMY N/A 07/07/2017   Procedure: LAPAROSCOPIC CHOLECYSTECTOMY;  Surgeon: Vernetta Berg, MD;  Location: [facility];  Service: General;  Laterality: N/A;   CYSTO WITH HYDRODISTENSION  03/29/2012   Procedure: CYSTOSCOPY/HYDRODISTENSION;  Surgeon: Glendia DELENA Elizabeth, MD;  Location: [facility];  Service: Urology;  Laterality: N/A;  with Instillation of Marcaine  and Pyridium     ESOPHAGOGASTRODUODENOSCOPY  2018   LAPAROSCOPY  03/29/2012   Procedure: LAPAROSCOPY OPERATIVE;  Surgeon: Shanda SHAUNNA Muscat, MD;  Location: [facility];  Service: Gynecology;  Laterality: N/A;  with Fulguration of Endometriosis and Peritoneal Biopsy   TONSILLECTOMY     TUBAL LIGATION  2010   UMBILICAL HERNIA REPAIR  10/2010    Family History  Problem Relation Age of Onset   Hyperlipidemia Mother    Allergic rhinitis Mother    Alzheimer's disease Father    Hyperlipidemia Sister    Diabetes Paternal Uncle    Heart disease Other        paternal family   Colon cancer Neg Hx    Breast cancer Neg Hx    Esophageal  cancer Neg Hx    Rectal cancer Neg Hx    Stomach cancer Neg Hx     Social History   Tobacco Use   Smoking status: Never    Passive exposure: Never   Smokeless tobacco: Never  Vaping Use   Vaping status: Never Used  Substance Use Topics   Alcohol  use: Yes    Comment: rarely    Drug use: No     Allergies  Allergen Reactions   Morphine Itching   Morphine And Codeine Itching and Other (See Comments)   Meloxicam Other (See Comments)    bloating  Causes gas    bloating    Review of Systems NEGATIVE UNLESS OTHERWISE INDICATED IN HPI      Objective:     BP 116/68   Pulse 66   Temp (!) 97.5 F (36.4 C) (Oral)   Resp 12   Ht 4' 11 (1.499 m)   Wt 134 lb 9.6 oz (61.1 kg)   SpO2 98%   BMI 27.19 kg/m   Wt Readings from Last 3 Encounters:  03/13/24 134 lb 9.6 oz (61.1 kg)  03/07/24 133 lb (60.3 kg)  08/31/23 142 lb (64.4 kg)    BP Readings from Last 3 Encounters:  03/13/24 116/68  03/07/24 96/62  08/31/23 104/70     Physical Exam Vitals and nursing note reviewed.  Constitutional:      General: She is not in acute distress.    Appearance: Normal appearance. She is not ill-appearing.  HENT:     Head: Normocephalic and atraumatic.   Cardiovascular:     Rate and Rhythm: Normal rate and regular rhythm.     Pulses: Normal pulses.     Heart sounds: Normal heart sounds.  Pulmonary:     Effort: Pulmonary effort is normal.     Breath sounds: Normal breath sounds.  Abdominal:     General: Abdomen is flat. Bowel sounds are normal. There is no distension.     Palpations: Abdomen is soft. There is no mass.     Tenderness: There is abdominal tenderness (generalized, mild). There is no right CVA tenderness, left CVA tenderness, guarding or rebound.     Hernia: No hernia is present.   Skin:    General: Skin is warm and dry.     Findings: No rash.   Neurological:     General: No focal deficit present.     Mental Status: She is alert.   Psychiatric:  Mood and Affect: Mood normal.             Amilya Haver M Makhayla Mcmurry, PA-C

## 2024-03-14 LAB — URINE CULTURE

## 2024-03-16 LAB — URINE CULTURE
MICRO NUMBER:: 16618321
SPECIMEN QUALITY:: ADEQUATE

## 2024-03-19 ENCOUNTER — Other Ambulatory Visit

## 2024-03-19 DIAGNOSIS — Z124 Encounter for screening for malignant neoplasm of cervix: Secondary | ICD-10-CM | POA: Diagnosis not present

## 2024-03-19 DIAGNOSIS — Z01419 Encounter for gynecological examination (general) (routine) without abnormal findings: Secondary | ICD-10-CM | POA: Diagnosis not present

## 2024-03-19 DIAGNOSIS — R1084 Generalized abdominal pain: Secondary | ICD-10-CM

## 2024-03-19 DIAGNOSIS — R1013 Epigastric pain: Secondary | ICD-10-CM | POA: Diagnosis not present

## 2024-03-19 DIAGNOSIS — N951 Menopausal and female climacteric states: Secondary | ICD-10-CM | POA: Diagnosis not present

## 2024-03-19 DIAGNOSIS — R110 Nausea: Secondary | ICD-10-CM | POA: Diagnosis not present

## 2024-03-19 DIAGNOSIS — K633 Ulcer of intestine: Secondary | ICD-10-CM

## 2024-03-19 DIAGNOSIS — R5383 Other fatigue: Secondary | ICD-10-CM

## 2024-03-19 DIAGNOSIS — N760 Acute vaginitis: Secondary | ICD-10-CM | POA: Diagnosis not present

## 2024-03-19 DIAGNOSIS — M6289 Other specified disorders of muscle: Secondary | ICD-10-CM | POA: Diagnosis not present

## 2024-03-19 DIAGNOSIS — Z6828 Body mass index (BMI) 28.0-28.9, adult: Secondary | ICD-10-CM | POA: Diagnosis not present

## 2024-03-19 NOTE — Addendum Note (Signed)
 Addended by: IDA ELORA HERO on: 03/19/2024 11:01 AM   Modules accepted: Orders

## 2024-03-20 ENCOUNTER — Ambulatory Visit: Payer: Self-pay

## 2024-03-20 ENCOUNTER — Telehealth: Payer: Self-pay | Admitting: *Deleted

## 2024-03-20 DIAGNOSIS — R102 Pelvic and perineal pain: Secondary | ICD-10-CM | POA: Diagnosis not present

## 2024-03-20 DIAGNOSIS — K5900 Constipation, unspecified: Secondary | ICD-10-CM | POA: Diagnosis not present

## 2024-03-20 DIAGNOSIS — L910 Hypertrophic scar: Secondary | ICD-10-CM | POA: Diagnosis not present

## 2024-03-20 DIAGNOSIS — M6281 Muscle weakness (generalized): Secondary | ICD-10-CM | POA: Diagnosis not present

## 2024-03-20 LAB — UNLABELED: Test Ordered On Req: 14693

## 2024-03-20 NOTE — Telephone Encounter (Unsigned)
 Copied from CRM [id number]. Topic: Clinical - Medical Advice >> Mar 20, 2024 11:58 AM [place] E wrote: Reason for CRM: Corean from [employer] called stating that patients stool sample did not have a name label on it, questioning if this test should still be ran. Ref #[id number] Callback number [phone].

## 2024-03-21 LAB — CALPROTECTIN, FECAL: Calprotectin, Fecal: 276 ug/g — ABNORMAL HIGH (ref 0–120)

## 2024-03-21 NOTE — Telephone Encounter (Signed)
 Sarah from the lab took care of this yesterday.

## 2024-03-22 ENCOUNTER — Other Ambulatory Visit: Payer: Self-pay | Admitting: Physician Assistant

## 2024-03-22 DIAGNOSIS — R1084 Generalized abdominal pain: Secondary | ICD-10-CM

## 2024-03-22 DIAGNOSIS — R195 Other fecal abnormalities: Secondary | ICD-10-CM

## 2024-03-22 DIAGNOSIS — K633 Ulcer of intestine: Secondary | ICD-10-CM

## 2024-03-22 NOTE — Telephone Encounter (Signed)
 Per Camie in the lab this has been corrected as far as labeled specimen. Please advise on referral for patient?

## 2024-03-26 ENCOUNTER — Ambulatory Visit: Payer: Self-pay

## 2024-03-26 DIAGNOSIS — M6289 Other specified disorders of muscle: Secondary | ICD-10-CM | POA: Diagnosis not present

## 2024-03-26 DIAGNOSIS — K5900 Constipation, unspecified: Secondary | ICD-10-CM | POA: Diagnosis not present

## 2024-03-26 DIAGNOSIS — M62838 Other muscle spasm: Secondary | ICD-10-CM | POA: Diagnosis not present

## 2024-03-26 DIAGNOSIS — R399 Unspecified symptoms and signs involving the genitourinary system: Secondary | ICD-10-CM | POA: Diagnosis not present

## 2024-03-26 DIAGNOSIS — M6281 Muscle weakness (generalized): Secondary | ICD-10-CM | POA: Diagnosis not present

## 2024-03-26 NOTE — Telephone Encounter (Signed)
  FYI Only or Action Required?: Action required by provider: Pt would like to bring in Urine specimen. Pt states that s/s never resolved for 2 weeks ago  Patient was last seen in primary care on 11/15/2022 by Lucius Krabbe, NP. Called Nurse Triage reporting Urinary Frequency. Symptoms began several weeks ago. Interventions attempted: Other: OV, and ABX. Symptoms are: unchanged.  Triage Disposition: See Physician Within 24 Hours  Patient/caregiver understands and will follow disposition?: No, refuses disposition                 Copied from CRM [id number]. Topic: Clinical - Red Word Triage >> Mar 26, 2024  9:22 AM Emylou G wrote: [facility] that prompted transfer to Nurse Triage: lower back hurts, can't sleep, sense of urgency urine, and body chills.. not sure if uti ( her pelvic area hurts) finished antibiotics.. Reason for Disposition  Side (flank) or lower back pain present  Answer Assessment - Initial Assessment Questions 1. SYMPTOM: What's the main symptom you're concerned about? (e.g., frequency, incontinence)     Back pain - urgency chills 2. ONSET: When did the  s/s  start?     Started 2 weeks ago  - was given ABX, and has not improved 3. PAIN: Is there any pain? If Yes, ask: How bad is it? (Scale: 1-10; mild, moderate, severe)     Yes - back pain 4. CAUSE: What do you think is causing the symptoms?     UTI 5. OTHER SYMPTOMS: Do you have any other symptoms? (e.g., blood in urine, fever, flank pain, pain with urination)     Chills, urgency  Protocols used: Urinary Symptoms-A-AH

## 2024-03-27 DIAGNOSIS — R1011 Right upper quadrant pain: Secondary | ICD-10-CM | POA: Diagnosis not present

## 2024-03-27 DIAGNOSIS — K581 Irritable bowel syndrome with constipation: Secondary | ICD-10-CM | POA: Diagnosis not present

## 2024-03-27 DIAGNOSIS — K30 Functional dyspepsia: Secondary | ICD-10-CM | POA: Diagnosis not present

## 2024-03-30 DIAGNOSIS — A492 Hemophilus influenzae infection, unspecified site: Secondary | ICD-10-CM | POA: Diagnosis not present

## 2024-04-01 LAB — PANCREATIC ELASTASE, FECAL: Pancreatic Elastase-1, Stool: >800 ug/g (ref >200)

## 2024-04-01 LAB — PAT ID TIQ DOC: Test Affected: 14693

## 2024-04-02 NOTE — Telephone Encounter (Signed)
 Please see pt reply to previous message and advise

## 2024-04-02 NOTE — Telephone Encounter (Signed)
 Please see pt msg reply and advise if anything further is needed at this time

## 2024-04-04 DIAGNOSIS — M6289 Other specified disorders of muscle: Secondary | ICD-10-CM | POA: Diagnosis not present

## 2024-04-04 DIAGNOSIS — M62838 Other muscle spasm: Secondary | ICD-10-CM | POA: Diagnosis not present

## 2024-04-04 DIAGNOSIS — M6281 Muscle weakness (generalized): Secondary | ICD-10-CM | POA: Diagnosis not present

## 2024-04-04 DIAGNOSIS — L910 Hypertrophic scar: Secondary | ICD-10-CM | POA: Diagnosis not present

## 2024-04-09 ENCOUNTER — Ambulatory Visit: Admitting: Dermatology

## 2024-04-09 ENCOUNTER — Other Ambulatory Visit: Payer: Self-pay

## 2024-04-09 DIAGNOSIS — L409 Psoriasis, unspecified: Secondary | ICD-10-CM

## 2024-04-09 DIAGNOSIS — R399 Unspecified symptoms and signs involving the genitourinary system: Secondary | ICD-10-CM | POA: Diagnosis not present

## 2024-04-09 MED ORDER — CLOBETASOL PROPIONATE 0.05 % EX SOLN: 1.0000 | Freq: Two times a day (BID) | CUTANEOUS | 6 refills | Status: DC

## 2024-05-10 DIAGNOSIS — M6289 Other specified disorders of muscle: Secondary | ICD-10-CM | POA: Diagnosis not present

## 2024-05-10 DIAGNOSIS — M62838 Other muscle spasm: Secondary | ICD-10-CM | POA: Diagnosis not present

## 2024-05-10 DIAGNOSIS — L910 Hypertrophic scar: Secondary | ICD-10-CM | POA: Diagnosis not present

## 2024-05-10 DIAGNOSIS — M6281 Muscle weakness (generalized): Secondary | ICD-10-CM | POA: Diagnosis not present

## 2024-05-15 DIAGNOSIS — R1011 Right upper quadrant pain: Secondary | ICD-10-CM | POA: Diagnosis not present

## 2024-05-15 DIAGNOSIS — K30 Functional dyspepsia: Secondary | ICD-10-CM | POA: Diagnosis not present

## 2024-05-15 DIAGNOSIS — K581 Irritable bowel syndrome with constipation: Secondary | ICD-10-CM | POA: Diagnosis not present

## 2024-05-15 DIAGNOSIS — K5900 Constipation, unspecified: Secondary | ICD-10-CM | POA: Diagnosis not present

## 2024-05-22 DIAGNOSIS — R911 Solitary pulmonary nodule: Secondary | ICD-10-CM

## 2024-05-25 ENCOUNTER — Ambulatory Visit (INDEPENDENT_AMBULATORY_CARE_PROVIDER_SITE_OTHER)

## 2024-05-25 ENCOUNTER — Encounter (HOSPITAL_BASED_OUTPATIENT_CLINIC_OR_DEPARTMENT_OTHER): Payer: Self-pay

## 2024-05-25 DIAGNOSIS — Z23 Encounter for immunization: Secondary | ICD-10-CM

## 2024-05-25 NOTE — Progress Notes (Signed)
 Patient is in office today for a nurse visit for Tetanus and Flu Immunization. Patient Injection was given in the  Right deltoid. Patient tolerated injection well. The tetanus was in the right deltoid and flu in the left deltoid. No further questions at this time.

## 2024-05-29 ENCOUNTER — Ambulatory Visit

## 2024-06-08 DIAGNOSIS — Z975 Presence of (intrauterine) contraceptive device: Secondary | ICD-10-CM | POA: Diagnosis not present

## 2024-06-08 DIAGNOSIS — K633 Ulcer of intestine: Secondary | ICD-10-CM | POA: Diagnosis not present

## 2024-06-18 ENCOUNTER — Other Ambulatory Visit: Payer: Self-pay | Admitting: Family

## 2024-06-18 ENCOUNTER — Encounter: Payer: Self-pay | Admitting: Physician Assistant

## 2024-06-18 NOTE — Telephone Encounter (Signed)
 Rx pt requesting previously filled by historical provider, please advise

## 2024-06-19 ENCOUNTER — Other Ambulatory Visit: Payer: Self-pay | Admitting: Physician Assistant

## 2024-06-26 ENCOUNTER — Telehealth: Payer: Self-pay | Admitting: Internal Medicine

## 2024-06-26 DIAGNOSIS — R911 Solitary pulmonary nodule: Secondary | ICD-10-CM

## 2024-06-26 NOTE — Telephone Encounter (Signed)
 Sierra Hodge with [employer] called in stating pt would like to have CT at [facility] imaging instead, she asked order can be faxed there. She said it does have to be reauthorized.   Fax: [phone]

## 2024-06-27 NOTE — Telephone Encounter (Signed)
 Per patient, this is preferred imaging location. Order printed, signed by MD, faxed 06/27/24

## 2024-06-27 NOTE — Telephone Encounter (Signed)
 As call came from [facility] and not patient, sent patient a MyChart message to clarify the order.

## 2024-06-29 ENCOUNTER — Telehealth: Payer: Self-pay | Admitting: Nurse Practitioner

## 2024-06-29 NOTE — Telephone Encounter (Signed)
 Pts insurance company requesting the CT order be faxed to [facility] [phone]) pt would like to have her scan done there.

## 2024-06-29 NOTE — Telephone Encounter (Signed)
 Faxed from order in [facility] 06/27/24 from Dr. Mona to: [phone].

## 2024-07-02 ENCOUNTER — Ambulatory Visit (HOSPITAL_BASED_OUTPATIENT_CLINIC_OR_DEPARTMENT_OTHER)

## 2024-07-04 DIAGNOSIS — R1023 Pelvic and perineal pain bilateral: Secondary | ICD-10-CM | POA: Diagnosis not present

## 2024-07-10 DIAGNOSIS — R911 Solitary pulmonary nodule: Secondary | ICD-10-CM | POA: Diagnosis not present

## 2024-07-12 DIAGNOSIS — M6289 Other specified disorders of muscle: Secondary | ICD-10-CM | POA: Diagnosis not present

## 2024-07-19 DIAGNOSIS — R1021 Pelvic and perineal pain right side: Secondary | ICD-10-CM | POA: Diagnosis not present

## 2024-07-19 DIAGNOSIS — M6281 Muscle weakness (generalized): Secondary | ICD-10-CM | POA: Diagnosis not present

## 2024-07-19 DIAGNOSIS — M62838 Other muscle spasm: Secondary | ICD-10-CM | POA: Diagnosis not present

## 2024-08-01 DIAGNOSIS — R1023 Pelvic and perineal pain bilateral: Secondary | ICD-10-CM | POA: Diagnosis not present

## 2024-08-11 DIAGNOSIS — B9689 Other specified bacterial agents as the cause of diseases classified elsewhere: Secondary | ICD-10-CM | POA: Diagnosis not present

## 2024-08-11 DIAGNOSIS — J019 Acute sinusitis, unspecified: Secondary | ICD-10-CM | POA: Diagnosis not present

## 2024-08-22 DIAGNOSIS — M6281 Muscle weakness (generalized): Secondary | ICD-10-CM | POA: Diagnosis not present

## 2024-08-22 DIAGNOSIS — M62838 Other muscle spasm: Secondary | ICD-10-CM | POA: Diagnosis not present

## 2024-08-22 DIAGNOSIS — R1021 Pelvic and perineal pain right side: Secondary | ICD-10-CM | POA: Diagnosis not present

## 2024-08-29 ENCOUNTER — Other Ambulatory Visit: Payer: Self-pay

## 2024-08-29 DIAGNOSIS — R399 Unspecified symptoms and signs involving the genitourinary system: Secondary | ICD-10-CM | POA: Diagnosis not present

## 2024-08-29 DIAGNOSIS — E782 Mixed hyperlipidemia: Secondary | ICD-10-CM

## 2024-08-31 ENCOUNTER — Encounter: Payer: Self-pay | Admitting: Physician Assistant

## 2024-08-31 ENCOUNTER — Ambulatory Visit

## 2024-08-31 ENCOUNTER — Ambulatory Visit: Payer: BC Managed Care – PPO | Admitting: Physician Assistant

## 2024-08-31 VITALS — BP 104/76 | HR 90 | Temp 97.5°F | Ht 59.0 in | Wt 134.8 lb

## 2024-08-31 DIAGNOSIS — M79609 Pain in unspecified limb: Secondary | ICD-10-CM | POA: Insufficient documentation

## 2024-08-31 DIAGNOSIS — E559 Vitamin D deficiency, unspecified: Secondary | ICD-10-CM

## 2024-08-31 DIAGNOSIS — E782 Mixed hyperlipidemia: Secondary | ICD-10-CM | POA: Diagnosis not present

## 2024-08-31 DIAGNOSIS — L4050 Arthropathic psoriasis, unspecified: Secondary | ICD-10-CM | POA: Insufficient documentation

## 2024-08-31 DIAGNOSIS — Z0000 Encounter for general adult medical examination without abnormal findings: Secondary | ICD-10-CM

## 2024-08-31 DIAGNOSIS — M25549 Pain in joints of unspecified hand: Secondary | ICD-10-CM | POA: Insufficient documentation

## 2024-08-31 DIAGNOSIS — G8929 Other chronic pain: Secondary | ICD-10-CM

## 2024-08-31 DIAGNOSIS — R918 Other nonspecific abnormal finding of lung field: Secondary | ICD-10-CM | POA: Insufficient documentation

## 2024-08-31 DIAGNOSIS — N3011 Interstitial cystitis (chronic) with hematuria: Secondary | ICD-10-CM | POA: Diagnosis not present

## 2024-08-31 DIAGNOSIS — F439 Reaction to severe stress, unspecified: Secondary | ICD-10-CM

## 2024-08-31 LAB — TSH: TSH: 1.84 u[IU]/mL (ref 0.35–5.50)

## 2024-08-31 LAB — COMPREHENSIVE METABOLIC PANEL WITH GFR
ALT: 23 U/L (ref 0–35)
AST: 19 U/L (ref 0–37)
Albumin: 4.6 g/dL (ref 3.5–5.2)
Alkaline Phosphatase: 56 U/L (ref 39–117)
BUN: 14 mg/dL (ref 6–23)
CO2: 28 meq/L (ref 19–32)
Calcium: 9.7 mg/dL (ref 8.4–10.5)
Chloride: 100 meq/L (ref 96–112)
Creatinine, Ser: 0.63 mg/dL (ref 0.40–1.20)
GFR: 103.75 mL/min (ref >60.00)
Glucose, Bld: 101 mg/dL — ABNORMAL HIGH (ref 70–99)
Potassium: 3.9 meq/L (ref 3.5–5.1)
Sodium: 136 meq/L (ref 135–145)
Total Bilirubin: 0.9 mg/dL (ref 0.2–1.2)
Total Protein: 7.5 g/dL (ref 6.0–8.3)

## 2024-08-31 LAB — CBC WITH DIFFERENTIAL/PLATELET
Basophils Absolute: 0 K/uL (ref 0.0–0.1)
Basophils Relative: 0.5 % (ref 0.0–3.0)
Eosinophils Absolute: 0.1 K/uL (ref 0.0–0.7)
Eosinophils Relative: 1.4 % (ref 0.0–5.0)
HCT: 41.6 % (ref 36.0–46.0)
Hemoglobin: 14.2 g/dL (ref 12.0–15.0)
Lymphocytes Relative: 29.3 % (ref 12.0–46.0)
Lymphs Abs: 2.1 K/uL (ref 0.7–4.0)
MCHC: 34.1 g/dL (ref 30.0–36.0)
MCV: 86.6 fl (ref 78.0–100.0)
Monocytes Absolute: 0.5 K/uL (ref 0.1–1.0)
Monocytes Relative: 7.2 % (ref 3.0–12.0)
Neutro Abs: 4.4 K/uL (ref 1.4–7.7)
Neutrophils Relative %: 61.6 % (ref 43.0–77.0)
Platelets: 299 K/uL (ref 150.0–400.0)
RBC: 4.8 Mil/uL (ref 3.87–5.11)
RDW: 13.6 % (ref 11.5–15.5)
WBC: 7.2 K/uL (ref 4.0–10.5)

## 2024-08-31 LAB — LIPID PANEL
Cholesterol: 224 mg/dL — ABNORMAL HIGH (ref 0–200)
HDL: 57.7 mg/dL (ref >39.00)
LDL Cholesterol: 158 mg/dL — ABNORMAL HIGH (ref 0–99)
NonHDL: 166.26
Total CHOL/HDL Ratio: 4
Triglycerides: 41 mg/dL (ref 0.0–149.0)
VLDL: 8.2 mg/dL (ref 0.0–40.0)

## 2024-08-31 LAB — VITAMIN D 25 HYDROXY (VIT D DEFICIENCY, FRACTURES): VITD: 31.22 ng/mL (ref 30.00–100.00)

## 2024-08-31 LAB — HEMOGLOBIN A1C: Hgb A1c MFr Bld: 5.6 % (ref 4.6–6.5)

## 2024-08-31 NOTE — Progress Notes (Signed)
 Patient ID: Sierra Hodge, female    DOB: July 23, 1976, 48 y.o.   MRN: [id number]   Assessment & Plan:  Annual physical exam -     CBC with Differential/Platelet -     Comprehensive metabolic panel with GFR -     Hemoglobin A1c -     Lipid panel -     TSH  Pulmonary nodules  Vitamin D  deficiency -     VITAMIN D  25 Hydroxy (Vit-D Deficiency, Fractures)  Interstitial cystitis (chronic) with hematuria  Chronic RLQ pain  Stress at home    Assessment & Plan Interstitial cystitis Flare-up with back pain, possibly triggered by dietary factors such as tomato consumption. Awaiting urine culture results to rule out infection. - Await urine culture results - Continue home remedies: baking soda in water, increased water intake, avoid caffeine and acidic foods  Chronic constipation Exacerbated by stress and dietary habits. Managed with Linzess and Miralax. Physical therapy and dietary modifications have been beneficial. - Continue Linzess and Miralax as needed - Maintain dietary modifications and stress management - Continue physical therapy  Pulmonary nodules Incidental finding of small pulmonary nodules on chest CT. No smoking history or secondhand smoke exposure. Low risk for malignancy. - Continue monitoring pulmonary nodules with follow-up imaging as recommended by cardiologist  Stress at home - Recommended counseling - placed info in AVS   General Health Maintenance Up to date on colon cancer screening. Pap smear and mammogram scheduled for next year due to scheduling issues. Flu shot received. - Continue routine health maintenance screenings as scheduled   Age-appropriate screening and counseling performed today. Will check labs and call with results. Preventive measures discussed and printed in AVS for patient.   Patient Counseling: [x]   Nutrition: Stressed importance of moderation in sodium/caffeine intake, saturated fat and cholesterol, caloric balance, sufficient  intake of fresh fruits, vegetables, and fiber.  [x]   Stressed the importance of regular exercise.   []   Substance Abuse: Discussed cessation/primary prevention of tobacco, alcohol , or other drug use; driving or other dangerous activities under the influence; availability of treatment for abuse.   []   Injury prevention: Discussed safety belts, safety helmets, smoke detector, smoking near bedding or upholstery.   []   Sexuality: Discussed sexually transmitted diseases, partner selection, use of condoms, avoidance of unintended pregnancy  and contraceptive alternatives.   [x]   Dental health: Discussed importance of regular tooth brushing, flossing, and dental visits.  [x]   Health maintenance and immunizations reviewed. Please refer to Health maintenance section.          Return in about 1 year (around 08/31/2025) for physical.    Subjective:    Chief Complaint  Patient presents with   Annual Exam    Pt in office for annual CPE and labs;     HPI Discussed the use of AI scribe software for clinical note transcription with the patient, who gave verbal consent to proceed.  History of Present Illness Sierra Hodge is a 48 year old female who presents for her annual physical exam.  She is experiencing a possible flare-up of interstitial cystitis and has contacted her urologist. A urine culture was performed, but results are pending. She experiences back pain and has a history of urinary tract infections diagnosed with urine tests.  She has a history of pulmonary nodules discovered during a chest CT in October. She has never smoked and has no significant exposure to secondhand smoke. She experiences shortness of breath when running and has wondered if  it could be related to asthma.  She has ongoing gastrointestinal issues, including constipation, which she manages with Linzess and Miralax. Stress exacerbates her symptoms, and she has been working with a physical therapist. A previous  diagnostic test involving a liquid and x-rays showed no abnormalities.  She uses an estrogen cream twice a week for abdominal pain related to tight muscles, which has improved her symptoms. She also has psoriasis, managed with clobetasol , and experiences occasional finger swelling and pain, which she suspects is related to psoriasis.  She is under significant stress due to her daughter's behavioral issues, including substance use. Her daughter has been involved in therapy, but there are concerns about the effectiveness and communication with the therapist. This situation has impacted her stress levels and overall well-being.     Past Medical History:  Diagnosis Date   Asthma 09/20/1988   Blood in urine 08/31/2023   Chronic interstitial cystitis    Complication of anesthesia    hard to wake after anesthesia   Constipation 08/31/2023   Endometriosis    Fatty liver    Headache    Hyperlipidemia 09/20/1998   IBS (irritable bowel syndrome)    Ileitis    IUD (intrauterine device) in place    Mirena , Dr. Raeanne   NAFLD (nonalcoholic fatty liver disease)    Other psoriasis    Pain in limb 08/31/2024    Past Surgical History:  Procedure Laterality Date   CESAREAN SECTION     2008, 2010   CHOLECYSTECTOMY N/A 07/07/2017   Procedure: LAPAROSCOPIC CHOLECYSTECTOMY;  Surgeon: Vernetta Berg, MD;  Location: [facility];  Service: General;  Laterality: N/A;   CYSTO WITH HYDRODISTENSION  03/29/2012   Procedure: CYSTOSCOPY/HYDRODISTENSION;  Surgeon: Glendia DELENA Elizabeth, MD;  Location: [facility];  Service: Urology;  Laterality: N/A;  with Instillation of Marcaine  and Pyridium     ESOPHAGOGASTRODUODENOSCOPY  2018   LAPAROSCOPY  03/29/2012   Procedure: LAPAROSCOPY OPERATIVE;  Surgeon: Shanda SHAUNNA Raeanne, MD;  Location: [facility];  Service: Gynecology;  Laterality: N/A;  with Fulguration of Endometriosis and Peritoneal Biopsy   TONSILLECTOMY     TUBAL LIGATION  2010   UMBILICAL HERNIA  REPAIR  10/2010    Family History  Problem Relation Age of Onset   Hyperlipidemia Mother    Allergic rhinitis Mother    Alzheimer's disease Father    Hyperlipidemia Sister    Diabetes Paternal Uncle    Heart disease Other        paternal family   Heart attack Paternal Aunt        He was going to get a valve repair but he died during surgery   Heart attack Paternal Aunt        She died at age 80 from a heart attack   Heart disease Paternal Uncle        He had a pacemaker   Colon cancer Neg Hx    Breast cancer Neg Hx    Esophageal cancer Neg Hx    Rectal cancer Neg Hx    Stomach cancer Neg Hx     Social History[1]   Allergies[2]  Review of Systems NEGATIVE UNLESS OTHERWISE INDICATED IN HPI      Objective:     BP 104/76 (BP Location: Left Arm, Patient Position: Sitting, Cuff Size: Normal)   Pulse 90   Temp (!) 97.5 F (36.4 C) (Temporal)   Ht 4' 11 (1.499 m)   Wt 134 lb 12.8 oz (61.1 kg)  SpO2 99%   BMI 27.23 kg/m   Wt Readings from Last 3 Encounters:  08/31/24 134 lb 12.8 oz (61.1 kg)  03/13/24 134 lb 9.6 oz (61.1 kg)  03/07/24 133 lb (60.3 kg)    BP Readings from Last 3 Encounters:  08/31/24 104/76  03/13/24 116/68  03/07/24 96/62     Physical Exam Vitals and nursing note reviewed.  Constitutional:      Appearance: Normal appearance. She is normal weight. She is not toxic-appearing.  HENT:     Head: Normocephalic and atraumatic.     Right Ear: External ear normal.     Left Ear: External ear normal.  Eyes:     Extraocular Movements: Extraocular movements intact.     Conjunctiva/sclera: Conjunctivae normal.     Pupils: Pupils are equal, round, and reactive to light.  Cardiovascular:     Rate and Rhythm: Normal rate and regular rhythm.     Pulses: Normal pulses.     Heart sounds: Normal heart sounds.  Pulmonary:     Effort: Pulmonary effort is normal.     Breath sounds: Normal breath sounds.  Musculoskeletal:        General: Normal range of  motion.     Cervical back: Normal range of motion and neck supple.  Skin:    General: Skin is warm and dry.  Neurological:     General: No focal deficit present.     Mental Status: She is alert and oriented to person, place, and time.  Psychiatric:        Mood and Affect: Mood normal.        Behavior: Behavior normal.             Edris Friedt M Deontay Ladnier, PA-C     [1]  Social History Tobacco Use   Smoking status: Never    Passive exposure: Never   Smokeless tobacco: Never  Vaping Use   Vaping status: Never Used  Substance Use Topics   Alcohol  use: Yes    Comment: rarely    Drug use: No  [2]  Allergies Allergen Reactions   Morphine Itching   Morphine And Codeine Itching and Other (See Comments)   Meloxicam Other (See Comments)    bloating  Causes gas    bloating

## 2024-08-31 NOTE — Patient Instructions (Signed)
 Recommend: Leita Hand - in [place], [place]  Counselor, [facility], LCAS, [facility] Verified by [employer]

## 2024-09-02 ENCOUNTER — Ambulatory Visit: Payer: Self-pay | Admitting: Physician Assistant

## 2024-09-03 ENCOUNTER — Ambulatory Visit (HOSPITAL_BASED_OUTPATIENT_CLINIC_OR_DEPARTMENT_OTHER): Payer: Self-pay | Admitting: Nurse Practitioner

## 2024-09-03 LAB — NMR, LIPOPROFILE
Cholesterol, Total: 248 mg/dL — ABNORMAL HIGH (ref 100–199)
HDL Particle Number: 30.1 umol/L — ABNORMAL LOW (ref >=30.5)
HDL-C: 62 mg/dL (ref >39)
LDL Particle Number: 1689 nmol/L — ABNORMAL HIGH (ref <1000)
LDL Size: 21.2 nm (ref >20.5)
LDL-C (NIH Calc): 178 mg/dL — ABNORMAL HIGH (ref 0–99)
LP-IR Score: <25 (ref <=45)
Small LDL Particle Number: <90 nmol/L (ref <=527)
Triglycerides: 49 mg/dL (ref 0–149)

## 2024-09-05 DIAGNOSIS — R1023 Pelvic and perineal pain bilateral: Secondary | ICD-10-CM | POA: Diagnosis not present

## 2024-09-06 ENCOUNTER — Ambulatory Visit (HOSPITAL_BASED_OUTPATIENT_CLINIC_OR_DEPARTMENT_OTHER): Admitting: Nurse Practitioner

## 2024-09-06 VITALS — BP 92/60 | HR 92 | Ht 59.0 in | Wt 138.0 lb

## 2024-09-06 DIAGNOSIS — Z131 Encounter for screening for diabetes mellitus: Secondary | ICD-10-CM

## 2024-09-06 DIAGNOSIS — R682 Dry mouth, unspecified: Secondary | ICD-10-CM | POA: Diagnosis not present

## 2024-09-06 DIAGNOSIS — Z7189 Other specified counseling: Secondary | ICD-10-CM | POA: Diagnosis not present

## 2024-09-06 DIAGNOSIS — E7849 Other hyperlipidemia: Secondary | ICD-10-CM | POA: Diagnosis not present

## 2024-09-06 DIAGNOSIS — E785 Hyperlipidemia, unspecified: Secondary | ICD-10-CM | POA: Diagnosis not present

## 2024-09-06 DIAGNOSIS — E7800 Pure hypercholesterolemia, unspecified: Secondary | ICD-10-CM

## 2024-09-06 DIAGNOSIS — R918 Other nonspecific abnormal finding of lung field: Secondary | ICD-10-CM | POA: Diagnosis not present

## 2024-09-06 MED ORDER — ROSUVASTATIN CALCIUM 5 MG PO TABS: 5.0000 mg | ORAL_TABLET | ORAL | 3 refills | Status: AC

## 2024-09-06 NOTE — Patient Instructions (Addendum)
 Medication Instructions:   START Rosuvastatin  one (1) tablet by mouth ( 5 mg) three times weekly. Starting after January 1.   *If you need a refill on your cardiac medications before your next appointment, please call your pharmacy*  Lab Work:  Your physician recommends that you return for a FASTING lipid profile/alt/A1c, fasting after midnight, 3 months and any lab corp. Patient given paperwork today.    If you have labs (blood work) drawn today and your tests are completely normal, you will receive your results only by: MyChart Message (if you have MyChart) OR A paper copy in the mail If you have any lab test that is abnormal or we need to change your treatment, we will call you to review the results.  Testing/Procedures:  None ordered.  Follow-Up: At [facility], you and your health needs are our priority.  As part of our continuing mission to provide you with exceptional heart care, our providers are all part of one team.  This team includes your primary Cardiologist (physician) and Advanced Practice Providers or APPs (Physician Assistants and Nurse Practitioners) who all work together to provide you with the care you need, when you need it.  Your next appointment:   6 month(s)  Provider:   Rosaline Bane, NP, Chad Hilty, MD    We recommend signing up for the patient portal called MyChart.  Sign up information is provided on this After Visit Summary.  MyChart is used to connect with patients for Virtual Visits (Telemedicine).  Patients are able to view lab/test results, encounter notes, upcoming appointments, etc.  Non-urgent messages can be sent to your provider as well.   To learn more about what you can do with MyChart, go to [email or web address].   Other Instructions      Dr. Glade Che (exercise physiologist) Dr. Wonda Silvan (orthopedic surgeon)

## 2024-09-06 NOTE — Progress Notes (Unsigned)
 Cardiology Office Note   Date:  09/06/2024  ID:  Sierra Hodge, DOB [birth year], MRN [id number] PCP: Kathrene Mardy HERO, PA-C  [facility] Providers Cardiologist:  None     PMH Dysliipidemia NAFLD Coronary calcium  score of 0 (CT 05/09/23)  Referred to Advanced Lipid disorders clinic and seen by Dr. Mona 03/22/2023.  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  She reported history of dyslipidemia with lipid panel at that time revealing total cholesterol 270, triglycerides 65, HDL 48, and LDL 156.  She made dietary changes and increased exercise to lower cholesterol without much success.  History of remote heart disease in her family, particularly on her father side in her uncles but not necessarily her father.  Both parents have high cholesterol she believes.  10-year risk calculation 0.9% and lifetime risk calculation of 36%.  Genetic testing was discussed.  She had a coronary calcium  score of 0 on 05/09/2023.  At follow-up visit 07/26/2023 cholesterol remained elevated with total cholesterol 213, triglycerides 90, HDL 50, and LDL 147.  LP(a) was negative.  Goal LDL less than 100.  She wanted to continue to work on diet and exercise before considering statin therapy.  Seen by me on 03/07/24. LDL decreased from 156 to 864. She was implementing lifestyle changes, including daily biking and dietary modifications, to further lower her LDL. Occasional chest pain, not associated with physical activity, and fluttery heart sensations linked to caffeine intake, which have resolved. No shortness of breath, palpitations, orthopnea, PND, edema, presyncope, syncope. Family history includes a cousin who recently had a heart attack at a young age. Having pain in her lower right quadrant which has been evaluated by GI and GYN with no apparent cause. She recalls someone telling her she had ulcers in her colon. Pain improves when bowel movements are regular.  We had a lengthy discussion about  ASCVD risk and management.  Plan was to repeat lipid panel and consider lipid-lowering therapy if LDL remained elevated.  NMR LipoProfile 08/31/2024 with LDL particle #1689, LDL-C 821, HDL-C 62, triglycerides 49, total cholesterol 248, and small LDL-P < 90.   History of Present Illness Discussed the use of AI scribe software for clinical note transcription with the patient, who gave verbal consent to proceed.  History of Present Illness Sierra Hodge is a very pleasant 48 year old female who is here today for follow-up of hyperlipidemia.    ROS: See HPI  Studies Reviewed       Lipoprotein (a)  Date/Time Value Ref Range Status  03/22/2023 10:54 AM <8.4 <75.0 nmol/L Final    Comment:    **Results verified by repeat testing** Note:  Values greater than or equal to 75.0 nmol/L may        indicate an independent risk factor for CHD,        but must be evaluated with caution when applied        to non-Caucasian populations due to the        influence of genetic factors on Lp(a) across        ethnicities.     Risk Assessment/Calculations           Physical Exam VS:  BP 92/60 (BP Location: Right Arm, Patient Position: Sitting, Cuff Size: Small)   Pulse 92   Ht 4' 11 (1.499 m)   Wt 138 lb (62.6 kg)   SpO2 96%   BMI 27.87 kg/m    Wt Readings from Last 3 Encounters:  09/06/24 138 lb (62.6 kg)  08/31/24 134 lb 12.8 oz (61.1 kg)  03/13/24 134 lb 9.6 oz (61.1 kg)    GEN: Well nourished, well developed in no acute distress NECK: No JVD; No carotid bruits CARDIAC: RRR, no murmurs, rubs, gallops RESPIRATORY:  Clear to auscultation without rales, wheezing or rhonchi  ABDOMEN: Soft, non-tender, non-distended EXTREMITIES:  No edema; No deformity    Assessment & Plan Hyperlipidemia  LDL goal < 100 NMR LipoProfile 08/31/2024 with LDL particle #1689, LDL-C 821, HDL-C 62, triglycerides 49, total cholesterol 248, and small LDL-P < 90.  LDL cholesterol is currently 135 mg/dL, with a  target of less than 100 given history of fatty liver. . Coronary calcium  score is zero, and lipoprotein A is negative. Benefits of statins were discussed, but she prefers to manage her condition through dietary changes. Provided information on foods high in saturated fat and avoiding processed foods, sugar, and other simple carbohydrates. We will recheck lipid panel in 6 months. Consider statin therapy if LDL does not decrease with dietary changes.   NAFLD She has right lower quadrant abdominal pain but reports liver imaging has been stable.  Pain improved with regular bowel movements.  She has improved her diet and continues to aim to limit saturated fat, sugar, processed foods, and other simple carbohydrates.  Management of LDL emphasized as noted above.   Cardiac Risk Assessment ASCVD Risk is < 1%. We discussed the factors that impact the score and that it will increase with age.  BP is well-controlled with no history of hypertension, she does not have history of smoking or diabetes. Continue focus on prevention including heart healthy mostly plant based diet avoiding saturated fat, processed foods, simple carbohydrates, and sugar along with aiming for at least 150 minutes of moderate intensity exercise each week.         Dispo: ***  Signed, Rosaline Bane, NP-C

## 2024-09-07 ENCOUNTER — Encounter (HOSPITAL_BASED_OUTPATIENT_CLINIC_OR_DEPARTMENT_OTHER): Payer: Self-pay | Admitting: Nurse Practitioner

## 2024-09-17 DIAGNOSIS — F411 Generalized anxiety disorder: Secondary | ICD-10-CM | POA: Diagnosis not present

## 2024-10-01 ENCOUNTER — Ambulatory Visit: Admitting: Dermatology

## 2024-10-02 ENCOUNTER — Encounter (HOSPITAL_BASED_OUTPATIENT_CLINIC_OR_DEPARTMENT_OTHER): Payer: Self-pay

## 2025-01-10 ENCOUNTER — Ambulatory Visit: Admitting: Dermatology

## 2025-09-06 ENCOUNTER — Encounter: Admitting: Physician Assistant
